# Patient Record
Sex: Female | Born: 1960
Health system: Southern US, Community
[De-identification: ages and names within clinical notes are randomized; demographics above are authoritative.]

## PROBLEM LIST (undated history)

## (undated) DIAGNOSIS — T7840XA Allergy, unspecified, initial encounter: Secondary | ICD-10-CM

## (undated) DIAGNOSIS — D649 Anemia, unspecified: Secondary | ICD-10-CM

## (undated) DIAGNOSIS — I10 Essential (primary) hypertension: Secondary | ICD-10-CM

## (undated) DIAGNOSIS — J45909 Unspecified asthma, uncomplicated: Secondary | ICD-10-CM

## (undated) DIAGNOSIS — F419 Anxiety disorder, unspecified: Secondary | ICD-10-CM

## (undated) HISTORY — DX: Anemia, unspecified: D64.9

## (undated) HISTORY — DX: Allergy, unspecified, initial encounter: T78.40XA

## (undated) HISTORY — DX: Anxiety disorder, unspecified: F41.9

## (undated) HISTORY — DX: Essential (primary) hypertension: I10

## (undated) HISTORY — DX: Unspecified asthma, uncomplicated: J45.909

## (undated) HISTORY — PX: NO PAST SURGERIES: SHX2092

---

## 1998-02-22 ENCOUNTER — Other Ambulatory Visit: Admission: RE | Admit: 1998-02-22 | Discharge: 1998-02-22 | Payer: Self-pay | Admitting: *Deleted

## 1999-03-14 ENCOUNTER — Other Ambulatory Visit: Admission: RE | Admit: 1999-03-14 | Discharge: 1999-03-14 | Payer: Self-pay | Admitting: *Deleted

## 2000-09-09 ENCOUNTER — Other Ambulatory Visit: Admission: RE | Admit: 2000-09-09 | Discharge: 2000-09-09 | Payer: Self-pay | Admitting: Internal Medicine

## 2001-09-09 ENCOUNTER — Other Ambulatory Visit: Admission: RE | Admit: 2001-09-09 | Discharge: 2001-09-09 | Payer: Self-pay | Admitting: Internal Medicine

## 2001-09-22 ENCOUNTER — Encounter: Payer: Self-pay | Admitting: Internal Medicine

## 2001-09-22 ENCOUNTER — Ambulatory Visit (HOSPITAL_COMMUNITY): Admission: RE | Admit: 2001-09-22 | Discharge: 2001-09-22 | Payer: Self-pay | Admitting: Internal Medicine

## 2002-09-29 ENCOUNTER — Encounter: Payer: Self-pay | Admitting: Internal Medicine

## 2002-09-29 ENCOUNTER — Ambulatory Visit (HOSPITAL_COMMUNITY): Admission: RE | Admit: 2002-09-29 | Discharge: 2002-09-29 | Payer: Self-pay | Admitting: Internal Medicine

## 2003-09-18 ENCOUNTER — Other Ambulatory Visit: Admission: RE | Admit: 2003-09-18 | Discharge: 2003-09-18 | Payer: Self-pay | Admitting: Internal Medicine

## 2003-09-28 ENCOUNTER — Encounter: Admission: RE | Admit: 2003-09-28 | Discharge: 2003-09-28 | Payer: Self-pay | Admitting: Internal Medicine

## 2005-04-03 ENCOUNTER — Ambulatory Visit (HOSPITAL_COMMUNITY): Admission: RE | Admit: 2005-04-03 | Discharge: 2005-04-03 | Payer: Self-pay | Admitting: Internal Medicine

## 2005-10-01 ENCOUNTER — Other Ambulatory Visit: Admission: RE | Admit: 2005-10-01 | Discharge: 2005-10-01 | Payer: Self-pay | Admitting: Internal Medicine

## 2006-01-13 ENCOUNTER — Other Ambulatory Visit: Admission: RE | Admit: 2006-01-13 | Discharge: 2006-01-13 | Payer: Self-pay | Admitting: Internal Medicine

## 2007-10-28 ENCOUNTER — Other Ambulatory Visit: Admission: RE | Admit: 2007-10-28 | Discharge: 2007-10-28 | Payer: Self-pay | Admitting: Internal Medicine

## 2009-11-01 ENCOUNTER — Other Ambulatory Visit: Admission: RE | Admit: 2009-11-01 | Discharge: 2009-11-01 | Payer: Self-pay | Admitting: Internal Medicine

## 2009-11-02 LAB — HM PAP SMEAR: HM Pap smear: NORMAL

## 2009-11-12 ENCOUNTER — Encounter: Admission: RE | Admit: 2009-11-12 | Discharge: 2009-11-12 | Payer: Self-pay | Admitting: Internal Medicine

## 2009-12-21 LAB — HM COLONOSCOPY

## 2012-10-25 ENCOUNTER — Other Ambulatory Visit: Payer: Self-pay

## 2012-10-25 DIAGNOSIS — Z1231 Encounter for screening mammogram for malignant neoplasm of breast: Secondary | ICD-10-CM

## 2012-11-12 ENCOUNTER — Ambulatory Visit
Admission: RE | Admit: 2012-11-12 | Discharge: 2012-11-12 | Disposition: A | Discharge status: Home / Self Care | Payer: BC Managed Care – PPO | Source: Ambulatory Visit

## 2012-11-12 DIAGNOSIS — Z1231 Encounter for screening mammogram for malignant neoplasm of breast: Secondary | ICD-10-CM

## 2012-11-12 LAB — HM MAMMOGRAPHY: HM MAMMO: NORMAL

## 2013-05-17 ENCOUNTER — Telehealth: Payer: Self-pay | Admitting: Physician Assistant

## 2013-05-17 MED ORDER — POTASSIUM CHLORIDE 20 MEQ/15ML (10%) PO LIQD: 20.0000 meq | Freq: Every day | ORAL | Status: DC

## 2013-05-17 NOTE — Telephone Encounter (Signed)
Pt said that last time she was in her potassium keeps goin low even with being on the med=klor=its still low. She said she thinks she knows why now=her bowel movement she could see the pill whole She wanted to know is there a liquid form of this med??? Call pt back at=[phone] until 5;30 or [phone] after 5;30 [facility] is [facility] [address]  Sending chart back

## 2013-05-18 NOTE — Telephone Encounter (Signed)
Pt aware rx called/sent in

## 2013-06-30 DIAGNOSIS — T7840XA Allergy, unspecified, initial encounter: Secondary | ICD-10-CM | POA: Insufficient documentation

## 2013-06-30 DIAGNOSIS — I10 Essential (primary) hypertension: Secondary | ICD-10-CM | POA: Insufficient documentation

## 2013-06-30 DIAGNOSIS — J45909 Unspecified asthma, uncomplicated: Secondary | ICD-10-CM | POA: Insufficient documentation

## 2013-06-30 DIAGNOSIS — D649 Anemia, unspecified: Secondary | ICD-10-CM | POA: Insufficient documentation

## 2013-06-30 DIAGNOSIS — Z9109 Other allergy status, other than to drugs and biological substances: Secondary | ICD-10-CM | POA: Insufficient documentation

## 2013-06-30 DIAGNOSIS — F419 Anxiety disorder, unspecified: Secondary | ICD-10-CM | POA: Insufficient documentation

## 2013-07-04 ENCOUNTER — Ambulatory Visit: Payer: Self-pay | Admitting: Physician Assistant

## 2013-07-15 ENCOUNTER — Ambulatory Visit (HOSPITAL_COMMUNITY)
Admission: RE | Admit: 2013-07-15 | Discharge: 2013-07-15 | Disposition: A | Discharge status: Home / Self Care | Payer: BC Managed Care – PPO | Source: Ambulatory Visit | Attending: Physician Assistant | Admitting: Physician Assistant

## 2013-07-15 ENCOUNTER — Ambulatory Visit (INDEPENDENT_AMBULATORY_CARE_PROVIDER_SITE_OTHER): Payer: BC Managed Care – PPO | Admitting: Physician Assistant

## 2013-07-15 ENCOUNTER — Encounter: Payer: Self-pay | Admitting: Physician Assistant

## 2013-07-15 VITALS — BP 128/78 | HR 72 | Temp 97.9°F | Resp 16 | Ht 63.5 in | Wt 156.0 lb

## 2013-07-15 DIAGNOSIS — M25511 Pain in right shoulder: Secondary | ICD-10-CM

## 2013-07-15 DIAGNOSIS — I10 Essential (primary) hypertension: Secondary | ICD-10-CM

## 2013-07-15 DIAGNOSIS — E785 Hyperlipidemia, unspecified: Secondary | ICD-10-CM

## 2013-07-15 DIAGNOSIS — R2231 Localized swelling, mass and lump, right upper limb: Secondary | ICD-10-CM

## 2013-07-15 DIAGNOSIS — M259 Joint disorder, unspecified: Secondary | ICD-10-CM | POA: Insufficient documentation

## 2013-07-15 DIAGNOSIS — Z79899 Other long term (current) drug therapy: Secondary | ICD-10-CM

## 2013-07-15 DIAGNOSIS — E782 Mixed hyperlipidemia: Secondary | ICD-10-CM

## 2013-07-15 DIAGNOSIS — E876 Hypokalemia: Secondary | ICD-10-CM

## 2013-07-15 DIAGNOSIS — M25519 Pain in unspecified shoulder: Secondary | ICD-10-CM

## 2013-07-15 DIAGNOSIS — E559 Vitamin D deficiency, unspecified: Secondary | ICD-10-CM

## 2013-07-15 LAB — HEPATIC FUNCTION PANEL
ALK PHOS: 60 U/L (ref 39–117)
ALT: 19 U/L (ref 0–35)
AST: 26 U/L (ref 0–37)
Albumin: 4.1 g/dL (ref 3.5–5.2)
BILIRUBIN DIRECT: 0.2 mg/dL (ref 0.0–0.3)
BILIRUBIN TOTAL: 0.6 mg/dL (ref 0.3–1.2)
Indirect Bilirubin: 0.4 mg/dL (ref 0.0–0.9)
Total Protein: 6.8 g/dL (ref 6.0–8.3)

## 2013-07-15 LAB — LIPID PANEL
CHOL/HDL RATIO: 2.7 ratio
Cholesterol: 153 mg/dL (ref 0–200)
HDL: 57 mg/dL (ref >39)
LDL Cholesterol: 77 mg/dL (ref 0–99)
Triglycerides: 97 mg/dL (ref <150)
VLDL: 19 mg/dL (ref 0–40)

## 2013-07-15 LAB — BASIC METABOLIC PANEL WITH GFR
BUN: 8 mg/dL (ref 6–23)
CHLORIDE: 100 meq/L (ref 96–112)
CO2: 23 meq/L (ref 19–32)
Calcium: 9.7 mg/dL (ref 8.4–10.5)
Creat: 0.8 mg/dL (ref 0.50–1.10)
GFR, Est African American: >89 mL/min
GFR, Est Non African American: 84 mL/min
Glucose, Bld: 85 mg/dL (ref 70–99)
Potassium: 3.7 mEq/L (ref 3.5–5.3)
SODIUM: 134 meq/L — AB (ref 135–145)

## 2013-07-15 LAB — CBC WITH DIFFERENTIAL/PLATELET
Basophils Absolute: 0.1 10*3/uL (ref 0.0–0.1)
Basophils Relative: 1 % (ref 0–1)
EOS PCT: 1 % (ref 0–5)
Eosinophils Absolute: 0.2 10*3/uL (ref 0.0–0.7)
HCT: 40.1 % (ref 36.0–46.0)
HEMOGLOBIN: 13.5 g/dL (ref 12.0–15.0)
LYMPHS ABS: 2.3 10*3/uL (ref 0.7–4.0)
LYMPHS PCT: 16 % (ref 12–46)
MCH: 33.5 pg (ref 26.0–34.0)
MCHC: 33.7 g/dL (ref 30.0–36.0)
MCV: 99.5 fL (ref 78.0–100.0)
Monocytes Absolute: 0.7 10*3/uL (ref 0.1–1.0)
Monocytes Relative: 5 % (ref 3–12)
Neutro Abs: 11 10*3/uL — ABNORMAL HIGH (ref 1.7–7.7)
Neutrophils Relative %: 77 % (ref 43–77)
Platelets: 375 10*3/uL (ref 150–400)
RBC: 4.03 MIL/uL (ref 3.87–5.11)
RDW: 13.6 % (ref 11.5–15.5)
WBC: 14.2 10*3/uL — AB (ref 4.0–10.5)

## 2013-07-15 LAB — TSH: TSH: 1.424 u[IU]/mL (ref 0.350–4.500)

## 2013-07-15 LAB — MAGNESIUM: Magnesium: 2 mg/dL (ref 1.5–2.5)

## 2013-07-15 MED ORDER — MELOXICAM 15 MG PO TABS: ORAL_TABLET | ORAL | Status: DC

## 2013-07-15 MED ORDER — TRAMADOL HCL 50 MG PO TABS: 50.0000 mg | ORAL_TABLET | Freq: Four times a day (QID) | ORAL | Status: DC | PRN

## 2013-07-15 NOTE — Patient Instructions (Addendum)
decrease HCTZ to 1/2 daily, you can increase the atenolol to a whole if needed and the goal is to be able to stop the potassium supplement.  Please get the white salt substitute, 1/4 teaspoon of this is equivalent to 4meq potassium.   High potassium foods from natural food sources like beans, dark leafy greens, potatoes, squash, yogurt, fish, avocados, mushrooms, and bananas, are considered safe and healthy.  DASH Diet The DASH diet stands for "Dietary Approaches to Stop Hypertension." It is a healthy eating plan that has been shown to reduce high blood pressure (hypertension) in as little as 14 days, while also possibly providing other significant health benefits. These other health benefits include reducing the risk of breast cancer after menopause and reducing the risk of type 2 diabetes, heart disease, colon cancer, and stroke. Health benefits also include weight loss and slowing kidney failure in patients with chronic kidney disease.  DIET GUIDELINES  Limit salt (sodium). Your diet should contain less than 1500 mg of sodium daily.  Limit refined or processed carbohydrates. Your diet should include mostly whole grains. Desserts and added sugars should be used sparingly.  Include small amounts of heart-healthy fats. These types of fats include nuts, oils, and tub margarine. Limit saturated and trans fats. These fats have been shown to be harmful in the body. CHOOSING FOODS  The following food groups are based on a 2000 calorie diet. See your Registered Dietitian for individual calorie needs. Grains and Grain Products (6 to 8 servings daily)  Eat More Often: Whole-wheat bread, brown rice, whole-grain or wheat pasta, quinoa, popcorn without added fat or salt (air popped).  Eat Less Often: White bread, white pasta, white rice, cornbread. Vegetables (4 to 5 servings daily)  Eat More Often: Fresh, frozen, and canned vegetables. Vegetables may be raw, steamed, roasted, or grilled with a minimal  amount of fat.  Eat Less Often/Avoid: Creamed or fried vegetables. Vegetables in a cheese sauce. Fruit (4 to 5 servings daily)  Eat More Often: All fresh, canned (in natural juice), or frozen fruits. Dried fruits without added sugar. One hundred percent fruit juice ( cup [237 mL] daily).  Eat Less Often: Dried fruits with added sugar. Canned fruit in light or heavy syrup. [employer], Fish, and Poultry (2 servings or less daily. One serving is 3 to 4 oz [85-114 g]).  Eat More Often: Ninety percent or leaner ground beef, tenderloin, sirloin. Round cuts of beef, chicken breast, [place] breast. All fish. Grill, bake, or broil your meat. Nothing should be fried.  Eat Less Often/Avoid: Fatty cuts of meat, [place], or chicken leg, thigh, or wing. Fried cuts of meat or fish. Dairy (2 to 3 servings)  Eat More Often: Low-fat or fat-free milk, low-fat plain or light yogurt, reduced-fat or part-skim cheese.  Eat Less Often/Avoid: Milk (whole, 2%).Whole milk yogurt. Full-fat cheeses. Nuts, Seeds, and Legumes (4 to 5 servings per week)  Eat More Often: All without added salt.  Eat Less Often/Avoid: Salted nuts and seeds, canned beans with added salt. Fats and Sweets (limited)  Eat More Often: Vegetable oils, tub margarines without trans fats, sugar-free gelatin. Mayonnaise and salad dressings.  Eat Less Often/Avoid: Coconut oils, palm oils, butter, stick margarine, cream, half and half, cookies, candy, pie. FOR MORE INFORMATION The Dash Diet Eating Plan: www.dashdiet.org Document Released: 06/05/2011 Document Revised: 09/08/2011 Document Reviewed: 06/05/2011 [facility] Patient Information 2014 [place], [place].   Right shoulder rest, ice/heat, can take Mobic or Tramadol.

## 2013-07-15 NOTE — Progress Notes (Signed)
HPI Patient presents for 3 month follow up with hypertension, hyperlipidemia, prediabetes and vitamin D. Patient's blood pressure has been controlled at home, today their BP is BP: 128/78 mmHg  Walks stairs at work. Patient denies chest pain, shortness of breath, dizziness.  Patient's cholesterol is diet controlled. The cholesterol last visit was Trigs 160, LDL 80, HDL 64.  She has Hypokalemia due to HCTZ- she takes liquid potassium 24meq daily. She denies swelling in her legs. She takes 1/2 of the atenolol at night. She is on the magnesium now.  Patient is on Vitamin D supplement.   Patient has right shoulder pain for 3-4 weeks, with no fall/injury other than she was carrying 30 lb cat food 3 weeks ago. There is a nodule/knot on right AC that is tender. She has full ROM of shoulder, she has some numbness/tingling in her right thumb with some weakness. Feels like her right thumb is "jumping". Painful during the day and the night, worse at night, often keeping her up and waking her up the last 2 nights. She has taken ibuprofen OTC/heat that helps for a short period.  Current Medications:  Current Outpatient Prescriptions on File Prior to Visit  Medication Sig Dispense Refill  . ALPRAZolam (XANAX) 0.5 MG tablet Take 0.5 mg by mouth 3 (three) times daily as needed for anxiety.      Marland Kitchen atenolol (TENORMIN) 50 MG tablet Take 50 mg by mouth daily.      . hydrochlorothiazide (HYDRODIURIL) 25 MG tablet Take 25 mg by mouth daily.      Marland Kitchen levonorgestrel-ethinyl estradiol (LEVORA 0.15/30, 28,) 0.15-30 MG-MCG tablet Take 1 tablet by mouth daily.      Marland Kitchen MAGNESIUM PO Take by mouth daily.      . montelukast (SINGULAIR) 10 MG tablet Take 10 mg by mouth at bedtime.      Marland Kitchen omeprazole (PRILOSEC) 20 MG capsule Take 20 mg by mouth 2 (two) times daily before a meal.      . potassium chloride 20 MEQ/15ML (10%) solution Take 15 mLs (20 mEq total) by mouth daily.  500 mL  0  . valsartan (DIOVAN) 320 MG tablet Take 320 mg by  mouth daily.       No current facility-administered medications on file prior to visit.   Medical History:  Past Medical History  Diagnosis Date  . Hypertension   . Allergy   . Anemia   . Anxiety   . Asthma    Allergies:  Allergies  Allergen Reactions  . Ciprofloxacin Nausea Only    Headache    ROS Constitutional: Denies fever, chills, headaches, insomnia, fatigue, night sweats Eyes: Denies redness, blurred vision, diplopia, discharge, itchy, watery eyes.  ENT: Denies congestion, post nasal drip, sore throat, earache, dental pain, Tinnitus, Vertigo, Sinus pain, snoring.  Cardio: Denies chest pain, palpitations, irregular heartbeat, dyspnea, diaphoresis, orthopnea, PND, claudication, edema Respiratory: denies cough, shortness of breath, wheezing.  Gastrointestinal: Denies dysphagia, heartburn, AB pain/ cramps, N/V, diarrhea, constipation, hematemesis, melena, hematochezia,  hemorrhoids Genitourinary: Denies dysuria, frequency, urgency, nocturia, hesitancy, discharge, hematuria, flank pain Musculoskeletal: + right shoulder pain Denies myalgia, stiffness, swelling and strain/sprain. Skin: Denies pruritis, rash, changing in skin lesion Neuro: Denies Weakness, tremor, incoordination, spasms, pain Psychiatric: Denies confusion, memory loss, sensory loss Endocrine: Denies change in weight, skin, hair change, nocturia Diabetic Polys, Denies visual blurring, hyper /hypo glycemic episodes, and paresthesia, Heme/Lymph: Denies Excessive bleeding, bruising, enlarged lymph nodes  Family history- Review and unchanged Social history- Review and unchanged Physical  Exam: Filed Vitals:   07/15/13 0941  BP: 128/78  Pulse: 72  Temp: 97.9 F (36.6 C)  Resp: 16   Filed Weights   07/15/13 0941  Weight: 156 lb (70.761 kg)   General Appearance: Well nourished, in no apparent distress. Eyes: PERRLA, EOMs, conjunctiva no swelling or erythema Sinuses: No Frontal/maxillary  tenderness ENT/Mouth: Ext aud canals clear, TMs without erythema, bulging. No erythema, swelling, or exudate on post pharynx.  Tonsils not swollen or erythematous. Hearing normal.  Neck: Supple, thyroid normal.  Respiratory: Respiratory effort normal, BS equal bilaterally without rales, rhonchi, wheezing or stridor.  Cardio: RRR with no MRGs. Brisk peripheral pulses without edema.  Abdomen: Soft, + BS.  Non tender, no guarding, rebound, hernias, masses. Lymphatics: Non tender without lymphadenopathy.  Musculoskeletal: Full ROM, 5/5 strength, normal gait. She has a hard, tender nonmobile nodule on her right AC joint. Full ROM without pain, + crepitus, decreased brachial reflexes, good cap refill,5/5 strength/grip.  Skin: Warm, dry without rashes, lesions, ecchymosis.  Neuro: Cranial nerves intact. Normal muscle tone, no cerebellar symptoms. Sensation intact.  Psych: Awake and oriented X 3, normal affect, Insight and Judgment appropriate.   Assessment and Plan:  Hypertension: Continue medication, monitor blood pressure at home. Continue DASH diet. Hypokalemia due to medication- we can try to decrease HCTZ to 1/2 daily, she can increase the atenolol to a whole if needed and the goal is to be able to stop the potassium supplement.  Cholesterol: Continue diet and exercise. Check cholesterol.  Vitamin D Def- check level and continue medications.  Right AC nodule without fall/injury- get Xray rule out mass, ? Ganglion, arthritis, AC separation. RICE, meloxicam, tramadol. Pending xray may do PT/send to ortho.   Right Shoulder IMPRESSION: 1. Acromioclavicular and glenohumeral degenerative change. No acute abnormality. 2. 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  Will refer to ortho  Continue diet and meds as discussed. Further disposition pending results of labs.  Vicie Mutters 9:50 AM

## 2013-07-16 LAB — VITAMIN D 25 HYDROXY (VIT D DEFICIENCY, FRACTURES): VIT D 25 HYDROXY: 57 ng/mL (ref 30–89)

## 2013-07-17 ENCOUNTER — Encounter: Payer: Self-pay | Admitting: Internal Medicine

## 2013-07-18 ENCOUNTER — Ambulatory Visit: Payer: Self-pay | Admitting: Physician Assistant

## 2013-07-19 ENCOUNTER — Encounter: Payer: Self-pay | Admitting: Physician Assistant

## 2013-07-28 ENCOUNTER — Ambulatory Visit: Payer: Self-pay | Admitting: Physician Assistant

## 2013-09-26 ENCOUNTER — Other Ambulatory Visit: Payer: Self-pay | Admitting: *Deleted

## 2013-09-26 MED ORDER — ATENOLOL 50 MG PO TABS: 50.0000 mg | ORAL_TABLET | Freq: Every day | ORAL | Status: DC

## 2013-11-14 ENCOUNTER — Other Ambulatory Visit: Payer: Self-pay | Admitting: Internal Medicine

## 2013-11-14 ENCOUNTER — Other Ambulatory Visit: Payer: Self-pay

## 2013-11-14 DIAGNOSIS — N951 Menopausal and female climacteric states: Secondary | ICD-10-CM

## 2013-11-14 DIAGNOSIS — F341 Dysthymic disorder: Secondary | ICD-10-CM

## 2013-11-14 MED ORDER — ALPRAZOLAM 0.5 MG PO TABS: 0.5000 mg | ORAL_TABLET | Freq: Three times a day (TID) | ORAL | Status: DC | PRN

## 2013-11-14 MED ORDER — LEVONORGESTREL-ETHINYL ESTRAD 0.15-30 MG-MCG PO TABS: 1.0000 | ORAL_TABLET | Freq: Every day | ORAL | Status: DC

## 2013-11-16 ENCOUNTER — Encounter: Payer: Self-pay | Admitting: Physician Assistant

## 2013-11-17 ENCOUNTER — Other Ambulatory Visit: Payer: Self-pay | Admitting: *Deleted

## 2013-11-17 DIAGNOSIS — N951 Menopausal and female climacteric states: Secondary | ICD-10-CM

## 2013-11-17 MED ORDER — LEVONORGESTREL-ETHINYL ESTRAD 0.15-30 MG-MCG PO TABS: 1.0000 | ORAL_TABLET | Freq: Every day | ORAL | Status: DC

## 2013-11-29 ENCOUNTER — Encounter: Payer: Self-pay | Admitting: Physician Assistant

## 2013-11-29 ENCOUNTER — Ambulatory Visit (INDEPENDENT_AMBULATORY_CARE_PROVIDER_SITE_OTHER): Payer: BC Managed Care – PPO | Admitting: Physician Assistant

## 2013-11-29 ENCOUNTER — Other Ambulatory Visit (HOSPITAL_COMMUNITY)
Admission: RE | Admit: 2013-11-29 | Discharge: 2013-11-29 | Disposition: A | Discharge status: Home / Self Care | Payer: BC Managed Care – PPO | Source: Ambulatory Visit | Attending: Physician Assistant | Admitting: Physician Assistant

## 2013-11-29 VITALS — BP 118/72 | HR 84 | Temp 98.2°F | Resp 16 | Ht 63.5 in | Wt 166.0 lb

## 2013-11-29 DIAGNOSIS — Z0000 Encounter for general adult medical examination without abnormal findings: Secondary | ICD-10-CM

## 2013-11-29 DIAGNOSIS — R2231 Localized swelling, mass and lump, right upper limb: Secondary | ICD-10-CM

## 2013-11-29 DIAGNOSIS — M25511 Pain in right shoulder: Secondary | ICD-10-CM

## 2013-11-29 DIAGNOSIS — Z1151 Encounter for screening for human papillomavirus (HPV): Secondary | ICD-10-CM | POA: Insufficient documentation

## 2013-11-29 DIAGNOSIS — Z01419 Encounter for gynecological examination (general) (routine) without abnormal findings: Secondary | ICD-10-CM | POA: Insufficient documentation

## 2013-11-29 DIAGNOSIS — Z124 Encounter for screening for malignant neoplasm of cervix: Secondary | ICD-10-CM

## 2013-11-29 LAB — CBC WITH DIFFERENTIAL/PLATELET
BASOS PCT: 1 % (ref 0–1)
Basophils Absolute: 0.1 10*3/uL (ref 0.0–0.1)
Eosinophils Absolute: 0.4 10*3/uL (ref 0.0–0.7)
Eosinophils Relative: 3 % (ref 0–5)
HCT: 37.8 % (ref 36.0–46.0)
HEMOGLOBIN: 12.7 g/dL (ref 12.0–15.0)
LYMPHS PCT: 24 % (ref 12–46)
Lymphs Abs: 3.1 10*3/uL (ref 0.7–4.0)
MCH: 32.9 pg (ref 26.0–34.0)
MCHC: 33.6 g/dL (ref 30.0–36.0)
MCV: 97.9 fL (ref 78.0–100.0)
MONOS PCT: 7 % (ref 3–12)
Monocytes Absolute: 0.9 10*3/uL (ref 0.1–1.0)
NEUTROS ABS: 8.5 10*3/uL — AB (ref 1.7–7.7)
Neutrophils Relative %: 65 % (ref 43–77)
Platelets: 341 10*3/uL (ref 150–400)
RBC: 3.86 MIL/uL — ABNORMAL LOW (ref 3.87–5.11)
RDW: 14.1 % (ref 11.5–15.5)
WBC: 13 10*3/uL — AB (ref 4.0–10.5)

## 2013-11-29 LAB — HEMOGLOBIN A1C
Hgb A1c MFr Bld: 5.3 % (ref <5.7)
Mean Plasma Glucose: 105 mg/dL (ref <117)

## 2013-11-29 MED ORDER — MELOXICAM 15 MG PO TABS: ORAL_TABLET | ORAL | Status: DC

## 2013-11-29 NOTE — Progress Notes (Signed)
Complete Physical  Assessment and Plan: Hypertension-- continue medications, DASH diet, exercise and monitor at home. Call if greater than 130/80.   Allergic rhinitis- Allegra OTC, increase H20, allergy hygiene explained.  Anemia- check CBC  Anxiety- discussed stress management and takes xanax PRN  Asthma- controlled  PAP MGM  Discussed med's effects and SE's. Screening labs and tests as requested with regular follow-up as recommended.  HPI 53 y.o. female  presents for a complete physical. Her blood pressure has been controlled at home, today their BP is BP: 118/72 mmHg She does workout, walks the stairs at work. She denies chest pain, shortness of breath, dizziness.  She is on cholesterol medication and denies myalgias. Her cholesterol is at goal. The cholesterol last visit was:   Lab Results  Component Value Date   CHOL 153 07/15/2013   HDL 57 07/15/2013   LDLCALC 77 07/15/2013   TRIG 97 07/15/2013   CHOLHDL 2.7 07/15/2013   Last A1C in the office was: 5.1 Patient is on Vitamin D supplement.   She has a history of hypokalemia due to HCTZ, she has decreased the HCTZ to 1/2 pill and taking magnesium, she is no longer on K+ and denies edema.  Current Medications:  Current Outpatient Prescriptions on File Prior to Visit  Medication Sig Dispense Refill  . ALPRAZolam (XANAX) 0.5 MG tablet Take 1 tablet (0.5 mg total) by mouth 3 (three) times daily as needed for anxiety.  90 tablet  0  . atenolol (TENORMIN) 50 MG tablet Take 1 tablet (50 mg total) by mouth daily.  90 tablet  1  . hydrochlorothiazide (HYDRODIURIL) 25 MG tablet Take 25 mg by mouth daily.      Marland Kitchen levonorgestrel-ethinyl estradiol (LEVORA 0.15/30, 28,) 0.15-30 MG-MCG tablet Take 1 tablet by mouth daily.  3 Package  4  . MAGNESIUM PO Take by mouth daily.      . meloxicam (MOBIC) 15 MG tablet Once daily with food for pain  90 tablet  1  . montelukast (SINGULAIR) 10 MG tablet Take 10 mg by mouth at bedtime.      Marland Kitchen omeprazole  (PRILOSEC) 20 MG capsule Take 20 mg by mouth 2 (two) times daily before a meal.      . potassium chloride 20 MEQ/15ML (10%) solution Take 15 mLs (20 mEq total) by mouth daily.  500 mL  0  . traMADol (ULTRAM) 50 MG tablet Take 1 tablet (50 mg total) by mouth every 6 (six) hours as needed.  90 tablet  0  . valsartan (DIOVAN) 320 MG tablet Take 320 mg by mouth daily.       No current facility-administered medications on file prior to visit.   Health Maintenance:   Immunization History  Administered Date(s) Administered  . Pneumococcal Polysaccharide-23 06/30/1996  . Tdap 11/16/2012   Tetanus: 2014 Pneumovax: 1998 Flu vaccine: declines Zostavax: N/A Pap: 2011  DUE MGM: 10/2012 DUE this year DEXA: N/A Colonoscopy: 2011 due 2021 EGD: N/A  Allergies:  Allergies  Allergen Reactions  . Ciprofloxacin Nausea Only    Headache   Medical History:  Past Medical History  Diagnosis Date  . Hypertension   . Allergy   . Anemia   . Anxiety   . Asthma    Surgical History: No past surgical history on file. Family History:  Family History  Problem Relation Age of Onset  . Hypertension Mother   . Diabetes Mother   . Thyroid disease Mother   . Cataracts Mother   .  Diabetes Father   . Hypertension Father   . Heart disease Father    Social History:  History  Substance Use Topics  . Smoking status: Never Smoker   . Smokeless tobacco: Never Used  . Alcohol Use: Yes     Comment: On occasion    Review of Systems: [X]  = complains of  [ ]  = denies  General: Fatigue [ ]  Fever [ ]  Chills [ ]  Weakness [ ]   Insomnia [ ] Weight change [ ]  Night sweats [ ]   Change in appetite [ ]  Eyes: Redness [ ]  Blurred vision [ ]  Diplopia [ ]  Discharge [ ]   ENT: Congestion [ ]  Sinus Pain [ ]  Post Nasal Drip [ ]  Sore Throat [ ]  Earache [ ]  hearing loss [ ]  Tinnitus [ ]  Snoring [ ]   Cardiac: Chest pain/pressure [ ]  SOB [ ]  Orthopnea [ ]   Palpitations [ ]   Paroxysmal nocturnal dyspnea[ ]  Claudication [ ]   Edema [ ]   Pulmonary: Cough [ ]  Wheezing[ ]   SOB [ ]   Pleurisy [ ]   GI: Nausea [ ]  Vomiting[ ]  Dysphagia[ ]  Heartburn[ ]  Abdominal pain [ ]  Constipation [ ] ; Diarrhea [ ]  BRBPR [ ]  Melena[ ]  Bloating [ ]  Hemorrhoids [ ]   GU: Hematuria[ ]  Dysuria [ ]  Nocturia[ ]  Urgency [ ]   Hesitancy [ ]  Discharge [ ]  Frequency [ ]   Breast:  Breast lumps [ ]   nipple discharge [ ]    Neuro: Headaches[ ]  Vertigo[ ]  Paresthesias[ ]  Spasm [ ]  Speech changes [ ]  Incoordination [ ]   Ortho: Arthritis [ ]  Joint pain [ ]  Muscle pain [ ]  Joint swelling [ ]  Back Pain [ ]  Skin:  Rash [ ]   Pruritis [ ]  Change in skin lesion [ ]   Psych: Depression[ ]  Anxiety[ ]  Confusion [ ]  Memory loss [ ]   Heme/Lypmh: Bleeding [ ]  Bruising [ ]  Enlarged lymph nodes [ ]   Endocrine: Visual blurring [ ]  Paresthesia [ ]  Polyuria [ ]  Polydypsea [ ]    Heat/cold intolerance [ ]  Hypoglycemia [ ]   Physical Exam: Estimated body mass index is 28.94 kg/(m^2) as calculated from the following:   Height as of this encounter: 5' 3.5" (1.613 m).   Weight as of this encounter: 166 lb (75.297 kg). BP 118/72  Pulse 84  Temp(Src) 98.2 F (36.8 C)  Resp 16  Ht 5' 3.5" (1.613 m)  Wt 166 lb (75.297 kg)  BMI 28.94 kg/m2 General Appearance: Well nourished, in no apparent distress. Eyes: PERRLA, EOMs, conjunctiva no swelling or erythema, normal fundi and vessels. Sinuses: No Frontal/maxillary tenderness ENT/Mouth: Ext aud canals clear, normal light reflex with TMs without erythema, bulging.  Good dentition. No erythema, swelling, or exudate on post pharynx. Tonsils not swollen or erythematous. Hearing normal.  Neck: Supple, thyroid normal. No bruits Respiratory: Respiratory effort normal, BS equal bilaterally without rales, rhonchi, wheezing or stridor. Cardio: RRR without murmurs, rubs or gallops. Brisk peripheral pulses without edema.  Chest: symmetric, with normal excursions and percussion. Breasts: Symmetric, without lumps, nipple discharge,  retractions. Abdomen: Soft, +BS. Non tender, no guarding, rebound, hernias, masses, or organomegaly. .  Lymphatics: Non tender without lymphadenopathy.  Genitourinary: normal external genitalia, vulva, vagina, cervix, uterus and adnexa, PAP: Pap smear done today. Musculoskeletal: Full ROM all peripheral extremities,5/5 strength, and normal gait. Skin: Warm, dry without rashes, lesions, ecchymosis.  Neuro: Cranial nerves intact, reflexes equal bilaterally. Normal muscle tone, no cerebellar symptoms. Sensation intact.  Psych: Awake and oriented X 3, normal  affect, Insight and Judgment appropriate.   EKG: WNL no changes. AORTA SCAN: WNL    Vicie Mutters 4:02 PM

## 2013-11-29 NOTE — Patient Instructions (Signed)
Preventative Care for Adults - Female      MAINTAIN REGULAR HEALTH EXAMS:  A routine yearly physical is a good way to check in with your primary care provider about your health and preventive screening. It is also an opportunity to share updates about your health and any concerns you have, and receive a thorough all-over exam.   Most health insurance companies pay for at least some preventative services.  Check with your health plan for specific coverages.  WHAT PREVENTATIVE SERVICES DO WOMEN NEED?  Adult women should have their weight and blood pressure checked regularly.   Women age 35 and older should have their cholesterol levels checked regularly.  Women should be screened for cervical cancer with a Pap smear and pelvic exam beginning at either age 21, or 3 years after they become sexually activity.    Breast cancer screening generally begins at age 40 with a mammogram and breast exam by your primary care provider.    Beginning at age 50 and continuing to age 75, women should be screened for colorectal cancer.  Certain people may need continued testing until age 85.  Updating vaccinations is part of preventative care.  Vaccinations help protect against diseases such as the flu.  Osteoporosis is a disease in which the bones lose minerals and strength as we age. Women ages 65 and over should discuss this with their caregivers, as should women after menopause who have other risk factors.  Lab tests are generally done as part of preventative care to screen for anemia and blood disorders, to screen for problems with the kidneys and liver, to screen for bladder problems, to check blood sugar, and to check your cholesterol level.  Preventative services generally include counseling about diet, exercise, avoiding tobacco, drugs, excessive alcohol consumption, and sexually transmitted infections.    GENERAL RECOMMENDATIONS FOR GOOD HEALTH:  Healthy diet:  Eat a variety of foods, including  fruit, vegetables, animal or vegetable protein, such as meat, fish, chicken, and eggs, or beans, lentils, tofu, and grains, such as rice.  Drink plenty of water daily.  Decrease saturated fat in the diet, avoid lots of red meat, processed foods, sweets, fast foods, and fried foods.  Exercise:  Aerobic exercise helps maintain good heart health. At least 30-40 minutes of moderate-intensity exercise is recommended. For example, a brisk walk that increases your heart rate and breathing. This should be done on most days of the week.   Find a type of exercise or a variety of exercises that you enjoy so that it becomes a part of your daily life.  Examples are running, walking, swimming, water aerobics, and biking.  For motivation and support, explore group exercise such as aerobic class, spin class, Zumba, Yoga,or  martial arts, etc.    Set exercise goals for yourself, such as a certain weight goal, walk or run in a race such as a 5k walk/run.  Speak to your primary care provider about exercise goals.  Disease prevention:  If you smoke or chew tobacco, find out from your caregiver how to quit. It can literally save your life, no matter how long you have been a tobacco user. If you do not use tobacco, never begin.   Maintain a healthy diet and normal weight. Increased weight leads to problems with blood pressure and diabetes.   The Body Mass Index or BMI is a way of measuring how much of your body is fat. Having a BMI above 27 increases the risk of heart disease,   diabetes, hypertension, stroke and other problems related to obesity. Your caregiver can help determine your BMI and based on it develop an exercise and dietary program to help you achieve or maintain this important measurement at a healthful level.  High blood pressure causes heart and blood vessel problems.  Persistent high blood pressure should be treated with medicine if weight loss and exercise do not work.   Fat and cholesterol leaves  deposits in your arteries that can block them. This causes heart disease and vessel disease elsewhere in your body.  If your cholesterol is found to be high, or if you have heart disease or certain other medical conditions, then you may need to have your cholesterol monitored frequently and be treated with medication.   Ask if you should have a cardiac stress test if your history suggests this. A stress test is a test done on a treadmill that looks for heart disease. This test can find disease prior to there being a problem.  Menopause can be associated with physical symptoms and risks. Hormone replacement therapy is available to decrease these. You should talk to your caregiver about whether starting or continuing to take hormones is right for you.   Osteoporosis is a disease in which the bones lose minerals and strength as we age. This can result in serious bone fractures. Risk of osteoporosis can be identified using a bone density scan. Women ages 65 and over should discuss this with their caregivers, as should women after menopause who have other risk factors. Ask your caregiver whether you should be taking a calcium supplement and Vitamin D, to reduce the rate of osteoporosis.   Avoid drinking alcohol in excess (more than two drinks per day).  Avoid use of street drugs. Do not share needles with anyone. Ask for professional help if you need assistance or instructions on stopping the use of alcohol, cigarettes, and/or drugs.  Brush your teeth twice a day with fluoride toothpaste, and floss once a day. Good oral hygiene prevents tooth decay and gum disease. The problems can be painful, unattractive, and can cause other health problems. Visit your dentist for a routine oral and dental check up and preventive care every 6-12 months.   Look at your skin regularly.  Use a mirror to look at your back. Notify your caregivers of changes in moles, especially if there are changes in shapes, colors, a size  larger than a pencil eraser, an irregular border, or development of new moles.  Safety:  Use seatbelts 100% of the time, whether driving or as a passenger.  Use safety devices such as hearing protection if you work in environments with loud noise or significant background noise.  Use safety glasses when doing any work that could send debris in to the eyes.  Use a helmet if you ride a bike or motorcycle.  Use appropriate safety gear for contact sports.  Talk to your caregiver about gun safety.  Use sunscreen with a SPF (or skin protection factor) of 15 or greater.  Lighter skinned people are at a greater risk of skin cancer. Don't forget to also wear sunglasses in order to protect your eyes from too much damaging sunlight. Damaging sunlight can accelerate cataract formation.   Practice safe sex. Use condoms. Condoms are used for birth control and to help reduce the spread of sexually transmitted infections (or STIs).  Some of the STIs are gonorrhea (the clap), chlamydia, syphilis, trichomonas, herpes, HPV (human papilloma virus) and HIV (human immunodeficiency virus)   which causes AIDS. The herpes, HIV and HPV are viral illnesses that have no cure. These can result in disability, cancer and death.   Keep carbon monoxide and smoke detectors in your home functioning at all times. Change the batteries every 6 months or use a model that plugs into the wall.   Vaccinations:  Stay up to date with your tetanus shots and other required immunizations. You should have a booster for tetanus every 10 years. Be sure to get your flu shot every year, since 5%-20% of the [place] population comes down with the flu. The flu vaccine changes each year, so being vaccinated once is not enough. Get your shot in the fall, before the flu season peaks.   Other vaccines to consider:  Human Papilloma Virus or HPV causes cancer of the cervix, and other infections that can be transmitted from person to person. There is a vaccine for  HPV, and females should get immunized between the ages of 63 and 23. It requires a series of 3 shots.   Pneumococcal vaccine to protect against certain types of pneumonia.  This is normally recommended for adults age 75 or older.  However, adults younger than 54 years old with certain underlying conditions such as diabetes, heart or lung disease should also receive the vaccine.  Shingles vaccine to protect against Varicella Zoster if you are older than age 57, or younger than 54 years old with certain underlying illness.  Hepatitis A vaccine to protect against a form of infection of the liver by a virus acquired from food.  Hepatitis B vaccine to protect against a form of infection of the liver by a virus acquired from blood or body fluids, particularly if you work in health care.  If you plan to travel internationally, check with your local health department for specific vaccination recommendations.  Cancer Screening:  Breast cancer screening is essential to preventive care for women. All women age 1 and older should perform a breast self-exam every month. At age 23 and older, women should have their caregiver complete a breast exam each year. Women at ages 63 and older should have a mammogram (x-ray film) of the breasts. Your caregiver can discuss how often you need mammograms.    Cervical cancer screening includes taking a Pap smear (sample of cells examined under a microscope) from the cervix (end of the uterus). It also includes testing for HPV (Human Papilloma Virus, which can cause cervical cancer). Screening and a pelvic exam should begin at age 110, or 3 years after a woman becomes sexually active. Screening should occur every year, with a Pap smear but no HPV testing, up to age 76. After age 66, you should have a Pap smear every 3 years with HPV testing, if no HPV was found previously.   Most routine colon cancer screening begins at the age of 68. On a yearly basis, doctors may provide  special easy to use take-home tests to check for hidden blood in the stool. Sigmoidoscopy or colonoscopy can detect the earliest forms of colon cancer and is life saving. These tests use a small camera at the end of a tube to directly examine the colon. Speak to your caregiver about this at age 17, when routine screening begins (and is repeated every 5 years unless early forms of pre-cancerous polyps or small growths are found).    Iliotibial Band Syndrome with Rehab The iliotibial (IT) band is a tendon that connects the hip muscles to the shinbone (tibia) and  to one of the bones of the pelvis (ileum). The IT band passes by the knee and is often irritated by the outer portion of the knee (lateral femoral condyle). A fluid filled sac (bursa) exists between the tendon and the bone, to cushion and reduce friction. Overuse of the tendon may cause excessive friction, which results in IT band syndrome. This condition involves inflammation of the bursa (bursitis) and/or inflammation of the IT band (tendinitis). SYMPTOMS   Pain, tenderness, swelling, warmth, or redness over the IT band, at the outer knee (above the joint).  Pain that travels up or down the thigh or leg.  Initially, pain at the beginning of an exercise, that decreases once warmed up. Eventually, pain throughout the activity, getting worse as the activity continues. May cause the athlete to stop in the middle of training or competing.  Pain that gets worse when running down hills or stairs, on banked tracks, or next to the curb on the street.  Pain that increases when the foot of the affected leg hits the ground.  Possibly, a crackling sound (crepitation) when the tendon or bursa is moved or touched. CAUSES  IT band syndrome is caused by irritation of the IT band and the underlying bursa. This eventually results in inflammation and pain. IT band syndrome is an overuse injury.  RISK INCREASES WITH:  Sports with repetitive knee-bending  activities (distance running, cycling).  Incorrect training techniques, including sudden changes in the intensity, frequency, or duration of training.  Not enough rest between workouts.  Poor strength and flexibility, especially a tight IT band.  Failure to warm up properly before activity.  Bow legs.  Arthritis of the knee. PREVENTION   Warm up and stretch properly before activity.  Allow for adequate recovery between workouts.  Maintain physical fitness:  Strength, flexibility, and endurance.  Cardiovascular fitness.  Learn and use proper training technique, including reducing running mileage, shortening stride, and avoiding running on hills and banked surfaces.  Wear arch supports (orthotics), if you have flat feet. PROGNOSIS  If treated properly, IT band syndrome usually goes away within 6 weeks of treatment. RELATED COMPLICATIONS   Longer healing time, if not properly treated, or if not given enough time to heal.  Recurring inflammation of the tendon and bursa, that may result in a chronic condition.  Recurring symptoms, if activity is resumed too soon, with overuse, with a direct blow, or with poor training technique.  Inability to complete training or competition. TREATMENT  Treatment first involves the use of ice and medicine, to reduce pain and inflammation. The use of strengthening and stretching exercises may help reduce pain with activity. These exercises may be performed at home or with a therapist. For individuals with flat feet, an arch support (orthotic) may be helpful. Some individuals find that wearing a knee sleeve or compression bandage around the knee during workouts provides some relief. Certain training techniques, such as adjusting stride length, avoiding running on hills or stairs, changing the direction you run on a circular or banked track, or changing the side of the road you run on, if you run next to the curb, may help decrease symptoms of IT band  syndrome. Cyclists may need to change the seat height or foot position on their bicycles. An injection of cortisone into the bursa may be recommended. Surgery to remove the inflamed bursa and/or part of the IT band is only considered after at least 6 months of non-surgical treatment.  MEDICATION   If pain  medicine is needed, nonsteroidal anti-inflammatory medicines (aspirin and ibuprofen), or other minor pain relievers (acetaminophen), are often advised.  Do not take pain medicine for 7 days before surgery.  Prescription pain relievers may be given, if your caregiver thinks they are needed. Use only as directed and only as much as you need.  Corticosteroid injections may be given by your caregiver. These injections should be reserved for the most serious cases, because they may only be given a certain number of times. HEAT AND COLD  Cold treatment (icing) should be applied for 10 to 15 minutes every 2 to 3 hours for inflammation and pain, and immediately after activity that aggravates your symptoms. Use ice packs or an ice massage.  Heat treatment may be used before performing stretching and strengthening activities prescribed by your caregiver, physical therapist, or athletic trainer. Use a heat pack or a warm water soak. SEEK MEDICAL CARE IF:   Symptoms get worse or do not improve in 2 to 4 weeks, despite treatment.  New, unexplained symptoms develop. (Drugs used in treatment may produce side effects.) EXERCISES  RANGE OF MOTION (ROM) AND STRETCHING EXERCISES - Iliotibial Band Syndrome These exercises may help you when beginning to rehabilitate your injury. Your symptoms may go away with or without further involvement from your physician, physical therapist or athletic trainer. While completing these exercises, remember:   Restoring tissue flexibility helps normal motion to return to the joints. This allows healthier, less painful movement and activity.  An effective stretch should be  held for at least 30 seconds.  A stretch should never be painful. You should only feel a gentle lengthening or release in the stretched tissue. STRETCH - Quadriceps, Prone   Lie on your stomach on a firm surface, such as a bed or padded floor.  Bend your right / left knee and grasp your ankle. If you are unable to reach your ankle or pant leg, use a belt around your foot to lengthen your reach.  Gently pull your heel toward your buttocks. Your knee should not slide out to the side. You should feel a stretch in the front of your thigh and knee.  Hold this position for __________ seconds. Repeat __________ times. Complete this stretch __________ times per day.  STRETCH  Iliotibial Band  On the floor or bed, lie on your side, so your right / left leg is on top. Bend your knee and grab your ankle.  Slowly bring your knee back so that your thigh is in line with your trunk. Keep your heel at your buttocks and gently arch your back, so your head, shoulders and hips line up.  Slowly lower your leg so that your knee approaches the floor or bed, until you feel a gentle stretch on the outside of your right / left thigh. If you do not feel a stretch and your knee will not fall farther, place the heel of your opposite foot on top of your knee, and pull your thigh down farther.  Hold this stretch for __________ seconds. Repeat __________ times. Complete this stretch __________ times per day. STRENGTHENING EXERCISES - Iliotibial Band Syndrome Improving the flexibility of the IT band will best relieve your discomfort due to IT band syndrome. Strengthening exercises, however, can help improve both muscle endurance and joint mechanics, reducing the factors that can contribute to this condition. Your physician, physical therapist or athletic trainer may provide you with exercises that train specific muscle groups that are especially weak. The following exercises target  muscles that are often weak in people who  have IT band syndrome. STRENGTH - Hip Abductors, Straight Leg Raises  Be aware of your form throughout the entire exercise, so that you exercise the correct muscles. Poor form means that you are not strengthening the correct muscles.  Lie on your side, so that your head, shoulders, knee and hip line up. You may bend your lower knee to help maintain your balance. Your right / left leg should be on top.  Roll your hips slightly forward, so that your hips are stacked directly over each other and your right / left knee is facing forward.  Lift your top leg up 4-6 inches, leading with your heel. Be sure that your foot does not drift forward and that your knee does not roll toward the ceiling.  Hold this position for __________ seconds. You should feel the muscles in your outer hip lifting (you may not notice this until your leg begins to tire).  Slowly lower your leg to the starting position. Allow the muscles to fully relax before beginning the next repetition. Repeat __________ times. Complete this exercise __________ times per day.  STRENGTH - Quad/VMO, Isometric  Sit in a chair with your right / left knee slightly bent. With your fingertips, feel the VMO muscle (just above the inside of your knee). The VMO is important in controlling the position of your kneecap.  Keeping your fingertips on this muscle. Without actually moving your leg, attempt to drive your knee down, as if straightening your leg. You should feel your VMO tense. If you have a difficult time, you may wish to try the same exercise on your healthy knee first.  Tense this muscle as hard as you can, without increasing any knee pain.  Hold for __________ seconds. Relax the muscles slowly and completely between each repetition. Repeat __________ times. Complete this exercise __________ times per day.  Document Released: 06/16/2005 Document Revised: 09/08/2011 Document Reviewed: 09/28/2008 [facility] Patient Information 2014  [place], [place].

## 2013-11-30 LAB — URINALYSIS, ROUTINE W REFLEX MICROSCOPIC
Bilirubin Urine: NEGATIVE
Glucose, UA: NEGATIVE mg/dL
Ketones, ur: NEGATIVE mg/dL
LEUKOCYTES UA: NEGATIVE
NITRITE: NEGATIVE
PROTEIN: NEGATIVE mg/dL
Specific Gravity, Urine: 1.01 (ref 1.005–1.030)
UROBILINOGEN UA: 0.2 mg/dL (ref 0.0–1.0)
pH: 6 (ref 5.0–8.0)

## 2013-11-30 LAB — BASIC METABOLIC PANEL WITH GFR
BUN: 10 mg/dL (ref 6–23)
CO2: 23 mEq/L (ref 19–32)
Calcium: 9.5 mg/dL (ref 8.4–10.5)
Chloride: 102 mEq/L (ref 96–112)
Creat: 0.74 mg/dL (ref 0.50–1.10)
Glucose, Bld: 84 mg/dL (ref 70–99)
Potassium: 4.2 mEq/L (ref 3.5–5.3)
SODIUM: 141 meq/L (ref 135–145)

## 2013-11-30 LAB — IRON AND TIBC
%SAT: 30 % (ref 20–55)
Iron: 138 ug/dL (ref 42–145)
TIBC: 454 ug/dL (ref 250–470)
UIBC: 316 ug/dL (ref 125–400)

## 2013-11-30 LAB — URINALYSIS, MICROSCOPIC ONLY
Bacteria, UA: NONE SEEN
Casts: NONE SEEN
Crystals: NONE SEEN
SQUAMOUS EPITHELIAL / LPF: NONE SEEN

## 2013-11-30 LAB — LIPID PANEL
CHOL/HDL RATIO: 3.3 ratio
Cholesterol: 166 mg/dL (ref 0–200)
HDL: 51 mg/dL (ref >39)
LDL CALC: 74 mg/dL (ref 0–99)
TRIGLYCERIDES: 206 mg/dL — AB (ref <150)
VLDL: 41 mg/dL — ABNORMAL HIGH (ref 0–40)

## 2013-11-30 LAB — TSH: TSH: 1.717 u[IU]/mL (ref 0.350–4.500)

## 2013-11-30 LAB — INSULIN, FASTING: Insulin fasting, serum: 24 u[IU]/mL (ref 3–28)

## 2013-11-30 LAB — MICROALBUMIN / CREATININE URINE RATIO
Creatinine, Urine: 64.5 mg/dL
MICROALB UR: 0.63 mg/dL (ref 0.00–1.89)
Microalb Creat Ratio: 9.8 mg/g (ref 0.0–30.0)

## 2013-11-30 LAB — VITAMIN B12: Vitamin B-12: 224 pg/mL (ref 211–911)

## 2013-11-30 LAB — MAGNESIUM: Magnesium: 1.8 mg/dL (ref 1.5–2.5)

## 2013-11-30 LAB — HEPATIC FUNCTION PANEL
ALT: 16 U/L (ref 0–35)
AST: 21 U/L (ref 0–37)
Albumin: 4.1 g/dL (ref 3.5–5.2)
Alkaline Phosphatase: 50 U/L (ref 39–117)
Bilirubin, Direct: <0.1 mg/dL (ref 0.0–0.3)
Total Bilirubin: 0.2 mg/dL (ref 0.2–1.2)
Total Protein: 6.7 g/dL (ref 6.0–8.3)

## 2013-11-30 LAB — VITAMIN D 25 HYDROXY (VIT D DEFICIENCY, FRACTURES): Vit D, 25-Hydroxy: 57 ng/mL (ref 30–89)

## 2013-12-01 LAB — CYTOLOGY - PAP

## 2013-12-21 ENCOUNTER — Other Ambulatory Visit: Payer: Self-pay | Admitting: *Deleted

## 2013-12-21 MED ORDER — HYDROCHLOROTHIAZIDE 25 MG PO TABS: 25.0000 mg | ORAL_TABLET | Freq: Every day | ORAL | Status: DC

## 2014-01-05 ENCOUNTER — Telehealth: Payer: Self-pay | Admitting: Internal Medicine

## 2014-01-05 ENCOUNTER — Other Ambulatory Visit: Payer: Self-pay | Admitting: Emergency Medicine

## 2014-01-05 MED ORDER — VALSARTAN 320 MG PO TABS: 320.0000 mg | ORAL_TABLET | Freq: Every day | ORAL | Status: DC

## 2014-01-05 NOTE — Telephone Encounter (Signed)
PATIENT CALLED TO ADVISE SHE HAS RUN OUT OF DIAVAN.  CALLED REFILL TO [facility]. WOULD LIKE TO REQUEST SHORT REFILL TO PHARM: [facility]- [place]  +   Thank you, [facility] [phone] Fax [phone] you, Katrina Judeth [facility] [phone] Fax [phone]

## 2014-01-12 ENCOUNTER — Other Ambulatory Visit: Payer: Self-pay | Admitting: Internal Medicine

## 2014-01-12 ENCOUNTER — Other Ambulatory Visit: Payer: Self-pay

## 2014-01-12 DIAGNOSIS — F341 Dysthymic disorder: Secondary | ICD-10-CM

## 2014-01-12 MED ORDER — ALPRAZOLAM 0.5 MG PO TABS
0.5000 mg | ORAL_TABLET | Freq: Three times a day (TID) | ORAL | Status: DC | PRN
Start: 2014-01-12 — End: 2014-05-23

## 2014-01-31 ENCOUNTER — Ambulatory Visit (INDEPENDENT_AMBULATORY_CARE_PROVIDER_SITE_OTHER): Payer: BC Managed Care – PPO | Admitting: Internal Medicine

## 2014-01-31 ENCOUNTER — Other Ambulatory Visit: Payer: Self-pay | Admitting: Internal Medicine

## 2014-01-31 ENCOUNTER — Encounter: Payer: Self-pay | Admitting: Internal Medicine

## 2014-01-31 VITALS — BP 134/80 | HR 84 | Temp 97.7°F | Resp 16 | Ht 63.5 in | Wt 163.2 lb

## 2014-01-31 DIAGNOSIS — M25511 Pain in right shoulder: Secondary | ICD-10-CM

## 2014-01-31 DIAGNOSIS — R2231 Localized swelling, mass and lump, right upper limb: Secondary | ICD-10-CM

## 2014-01-31 DIAGNOSIS — M25549 Pain in joints of unspecified hand: Secondary | ICD-10-CM

## 2014-01-31 DIAGNOSIS — M25541 Pain in joints of right hand: Secondary | ICD-10-CM

## 2014-01-31 LAB — RHEUMATOID FACTOR

## 2014-01-31 LAB — SEDIMENTATION RATE: SED RATE: 1 mm/h (ref 0–22)

## 2014-01-31 MED ORDER — PREDNISONE 20 MG PO TABS: ORAL_TABLET | ORAL | Status: DC

## 2014-01-31 MED ORDER — TRAMADOL HCL 50 MG PO TABS: 50.0000 mg | ORAL_TABLET | Freq: Four times a day (QID) | ORAL | Status: DC | PRN

## 2014-01-31 NOTE — Patient Instructions (Signed)

## 2014-01-31 NOTE — Progress Notes (Signed)
   Subjective:    Patient ID: Valerie Hobbs, female    DOB: [birth year], 53 y.o.   MRN: [id number]  HPI Patient presents with tender joints of Rt hand & fingers . 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  Medication Sig  . ALPRAZolam (XANAX) 0.5 MG tablet Take 1 tablet (0.5 mg total) by mouth 3 (three) times daily as needed for anxiety.  . Ascorbic Acid (VITAMIN C PO) Take by mouth daily.  Marland Kitchen atenolol (TENORMIN) 50 MG tablet Take 1 tablet (50 mg total) by mouth daily.  . hydrochlorothiazide (HYDRODIURIL) 25 MG tablet Take 1 tablet (25 mg total) by mouth daily.  . Lactobacillus (ACIDOPHILUS PO) Take by mouth daily.  Marland Kitchen levonorgestrel-ethinyl estradiol (LEVORA 0.15/30, 28,) 0.15-30 MG-MCG tablet Take 1 tablet by mouth daily.  Marland Kitchen LYSINE PO Take by mouth daily.  Marland Kitchen MAGNESIUM PO Take 400 mg by mouth daily.   . meloxicam (MOBIC) 15 MG tablet Once daily with food for pain  . montelukast (SINGULAIR) 10 MG tablet Take 10 mg by mouth at bedtime.  Marland Kitchen omeprazole (PRILOSEC) 20 MG capsule Take 20 mg by mouth 2 (two) times daily before a meal.  . traMADol (ULTRAM) 50 MG tablet Take 1 tablet (50 mg total) by mouth every 6 (six) hours as needed.  . valACYclovir (VALTREX) 500 MG tablet Take 500 mg by mouth daily as needed.  . valsartan (DIOVAN) 320 MG tablet Take 1 tablet (320 mg total) by mouth daily.   Allergies  Allergen Reactions  . Ciprofloxacin Nausea Only    Headache  . Penicillins    Past Medical History  Diagnosis Date  . Hypertension   . Allergy   . Anemia   . Anxiety   . Asthma    Review of Systems In addition to the HPI above,  No Fever-chills,  No Headache, No changes with Vision or hearing,  No problems swallowing food or Liquids,  No Chest pain or productive Cough or Shortness of Breath,  No Abdominal pain, No Nausea or Vomitting, Bowel movements are regular,  No Blood in stool or Urine,  No  dysuria,  No new skin rashes or bruises,   No new weakness, tingling, numbness in any extremity,  No recent weight loss,  No polyuria, polydypsia or polyphagia,  No significant Mental Stressors.  A full 10 point Review of Systems was done, except as stated above, all other Review of Systems were negative  Objective:   Physical Exam BP 134/80  Pulse 84  Temp(Src) 97.7 F (36.5 C) (Temporal)  Resp 16  Ht 5' 3.5" (1.613 m)  Wt 163 lb 3.2 oz (74.027 kg)  BMI 28.45 kg/m2  Exam focused on the right hand finds tenderness of the MCP of the right index & long fingers and the PIP & DIP of the  Right index finger and a ? Nodule over the DIP of the right index finger. No obvious joint swellingor erythemia.  Assessment & Plan:   1. Arthralgia of hand, right  - Sedimentation rate - Anti-DNA antibody, double-stranded - ANA - C3 and C4 - Cyclic citrul peptide antibody, IgG - Rheumatoid factor  Rx - Prednisone pulse taper pending labs.

## 2014-02-01 LAB — ANA: Anti Nuclear Antibody(ANA): NEGATIVE

## 2014-02-01 LAB — CYCLIC CITRUL PEPTIDE ANTIBODY, IGG: Cyclic Citrullin Peptide Ab: <2 U/mL (ref 0.0–5.0)

## 2014-02-01 LAB — C3 AND C4
C3 Complement: 129 mg/dL (ref 90–180)
C4 COMPLEMENT: 25 mg/dL (ref 10–40)

## 2014-02-01 LAB — ANTI-DNA ANTIBODY, DOUBLE-STRANDED: ds DNA Ab: 3 IU/mL

## 2014-02-18 ENCOUNTER — Other Ambulatory Visit: Payer: Self-pay | Admitting: Internal Medicine

## 2014-02-22 ENCOUNTER — Other Ambulatory Visit: Payer: Self-pay | Admitting: *Deleted

## 2014-02-22 MED ORDER — ATENOLOL 50 MG PO TABS: ORAL_TABLET | ORAL | Status: DC

## 2014-02-23 ENCOUNTER — Other Ambulatory Visit: Payer: Self-pay | Admitting: *Deleted

## 2014-02-23 MED ORDER — ATENOLOL 50 MG PO TABS: ORAL_TABLET | ORAL | Status: DC

## 2014-02-28 ENCOUNTER — Other Ambulatory Visit: Payer: Self-pay | Admitting: *Deleted

## 2014-02-28 MED ORDER — VALSARTAN 320 MG PO TABS: 320.0000 mg | ORAL_TABLET | Freq: Every day | ORAL | Status: DC

## 2014-04-05 ENCOUNTER — Other Ambulatory Visit: Payer: Self-pay

## 2014-04-05 DIAGNOSIS — Z1239 Encounter for other screening for malignant neoplasm of breast: Secondary | ICD-10-CM

## 2014-04-06 ENCOUNTER — Other Ambulatory Visit: Payer: Self-pay | Admitting: Physician Assistant

## 2014-04-06 DIAGNOSIS — M25511 Pain in right shoulder: Secondary | ICD-10-CM

## 2014-04-06 DIAGNOSIS — R2231 Localized swelling, mass and lump, right upper limb: Secondary | ICD-10-CM

## 2014-04-06 MED ORDER — TRAMADOL HCL 50 MG PO TABS: 50.0000 mg | ORAL_TABLET | Freq: Four times a day (QID) | ORAL | Status: DC | PRN

## 2014-04-07 MED ORDER — TRAMADOL HCL 50 MG PO TABS: 50.0000 mg | ORAL_TABLET | Freq: Four times a day (QID) | ORAL | Status: DC | PRN

## 2014-04-09 ENCOUNTER — Other Ambulatory Visit: Payer: Self-pay | Admitting: Physician Assistant

## 2014-04-17 ENCOUNTER — Other Ambulatory Visit: Payer: Self-pay | Admitting: Physician Assistant

## 2014-04-17 ENCOUNTER — Other Ambulatory Visit: Payer: Self-pay

## 2014-04-17 MED ORDER — CITALOPRAM HYDROBROMIDE 20 MG PO TABS
20.0000 mg | ORAL_TABLET | Freq: Every day | ORAL | Status: DC
Start: 2014-04-17 — End: 2014-05-14

## 2014-04-17 NOTE — Telephone Encounter (Signed)
Patient called office today, wants to restart Celexa , per Vicie Mutters, PA made patient aware that she sent 20 mg # 30 to pharmacy and that she will need to follow up for refills it has been some time since she has been on this medication. Patient aware and will call to schedule follow up could not at this time.

## 2014-04-19 ENCOUNTER — Ambulatory Visit: Payer: BC Managed Care – PPO

## 2014-05-14 ENCOUNTER — Other Ambulatory Visit: Payer: Self-pay | Admitting: Physician Assistant

## 2014-05-23 ENCOUNTER — Other Ambulatory Visit: Payer: Self-pay | Admitting: *Deleted

## 2014-05-23 DIAGNOSIS — F341 Dysthymic disorder: Secondary | ICD-10-CM

## 2014-05-23 MED ORDER — ALPRAZOLAM 0.5 MG PO TABS: 0.5000 mg | ORAL_TABLET | Freq: Three times a day (TID) | ORAL | Status: DC | PRN

## 2014-07-03 ENCOUNTER — Other Ambulatory Visit: Payer: Self-pay | Admitting: *Deleted

## 2014-07-03 MED ORDER — MONTELUKAST SODIUM 10 MG PO TABS: 10.0000 mg | ORAL_TABLET | Freq: Every day | ORAL | Status: DC

## 2014-07-19 ENCOUNTER — Other Ambulatory Visit: Payer: Self-pay | Admitting: *Deleted

## 2014-07-19 MED ORDER — MONTELUKAST SODIUM 10 MG PO TABS: 10.0000 mg | ORAL_TABLET | Freq: Every day | ORAL | Status: DC

## 2014-08-21 ENCOUNTER — Other Ambulatory Visit: Payer: Self-pay | Admitting: *Deleted

## 2014-08-21 DIAGNOSIS — N951 Menopausal and female climacteric states: Secondary | ICD-10-CM

## 2014-08-21 MED ORDER — LEVONORGESTREL-ETHINYL ESTRAD 0.15-30 MG-MCG PO TABS: 1.0000 | ORAL_TABLET | Freq: Every day | ORAL | Status: DC

## 2014-08-30 ENCOUNTER — Other Ambulatory Visit: Payer: Self-pay | Admitting: *Deleted

## 2014-08-30 ENCOUNTER — Other Ambulatory Visit: Payer: Self-pay

## 2014-08-30 DIAGNOSIS — F341 Dysthymic disorder: Secondary | ICD-10-CM

## 2014-08-30 MED ORDER — VALSARTAN 320 MG PO TABS: 320.0000 mg | ORAL_TABLET | Freq: Every day | ORAL | Status: DC

## 2014-08-30 MED ORDER — ALPRAZOLAM 0.5 MG PO TABS: 0.5000 mg | ORAL_TABLET | Freq: Three times a day (TID) | ORAL | Status: DC | PRN

## 2014-10-16 ENCOUNTER — Other Ambulatory Visit: Payer: Self-pay | Admitting: *Deleted

## 2014-10-16 DIAGNOSIS — F341 Dysthymic disorder: Secondary | ICD-10-CM

## 2014-10-16 MED ORDER — ALPRAZOLAM 0.5 MG PO TABS
0.5000 mg | ORAL_TABLET | Freq: Three times a day (TID) | ORAL | Status: DC | PRN
Start: 2014-10-16 — End: 2014-11-30

## 2014-10-23 ENCOUNTER — Other Ambulatory Visit: Payer: Self-pay | Admitting: Physician Assistant

## 2014-10-23 MED ORDER — CITALOPRAM HYDROBROMIDE 20 MG PO TABS: 20.0000 mg | ORAL_TABLET | Freq: Every day | ORAL | Status: DC

## 2014-11-09 ENCOUNTER — Other Ambulatory Visit: Payer: Self-pay

## 2014-11-09 MED ORDER — ATENOLOL 50 MG PO TABS: ORAL_TABLET | ORAL | Status: DC

## 2014-11-10 ENCOUNTER — Other Ambulatory Visit: Payer: Self-pay

## 2014-11-10 DIAGNOSIS — Z1231 Encounter for screening mammogram for malignant neoplasm of breast: Secondary | ICD-10-CM

## 2014-11-15 ENCOUNTER — Ambulatory Visit
Admission: RE | Admit: 2014-11-15 | Discharge: 2014-11-15 | Disposition: A | Discharge status: Home / Self Care | Payer: 59 | Source: Ambulatory Visit

## 2014-11-15 DIAGNOSIS — Z1231 Encounter for screening mammogram for malignant neoplasm of breast: Secondary | ICD-10-CM

## 2014-11-30 ENCOUNTER — Other Ambulatory Visit: Payer: Self-pay | Admitting: *Deleted

## 2014-11-30 DIAGNOSIS — F341 Dysthymic disorder: Secondary | ICD-10-CM

## 2014-11-30 MED ORDER — ALPRAZOLAM 0.5 MG PO TABS: 0.5000 mg | ORAL_TABLET | Freq: Three times a day (TID) | ORAL | Status: DC | PRN

## 2014-11-30 MED ORDER — HYDROCHLOROTHIAZIDE 25 MG PO TABS: 25.0000 mg | ORAL_TABLET | Freq: Every day | ORAL | Status: DC

## 2014-12-03 ENCOUNTER — Other Ambulatory Visit: Payer: Self-pay | Admitting: Internal Medicine

## 2014-12-04 ENCOUNTER — Other Ambulatory Visit: Payer: Self-pay

## 2014-12-04 DIAGNOSIS — F341 Dysthymic disorder: Secondary | ICD-10-CM

## 2014-12-04 MED ORDER — ALPRAZOLAM 0.5 MG PO TABS: 0.5000 mg | ORAL_TABLET | Freq: Three times a day (TID) | ORAL | Status: DC | PRN

## 2014-12-04 MED ORDER — HYDROCHLOROTHIAZIDE 25 MG PO TABS
25.0000 mg | ORAL_TABLET | Freq: Every day | ORAL | Status: DC
Start: 2014-12-04 — End: 2015-07-20

## 2014-12-07 ENCOUNTER — Ambulatory Visit (INDEPENDENT_AMBULATORY_CARE_PROVIDER_SITE_OTHER): Payer: 59 | Admitting: Physician Assistant

## 2014-12-07 ENCOUNTER — Encounter: Payer: Self-pay | Admitting: Physician Assistant

## 2014-12-07 ENCOUNTER — Other Ambulatory Visit: Payer: Self-pay | Admitting: Physician Assistant

## 2014-12-07 VITALS — BP 144/80 | HR 84 | Temp 98.4°F | Resp 18 | Ht 63.5 in | Wt 172.0 lb

## 2014-12-07 DIAGNOSIS — M25511 Pain in right shoulder: Secondary | ICD-10-CM

## 2014-12-07 DIAGNOSIS — R0683 Snoring: Secondary | ICD-10-CM

## 2014-12-07 DIAGNOSIS — I10 Essential (primary) hypertension: Secondary | ICD-10-CM

## 2014-12-07 DIAGNOSIS — G4700 Insomnia, unspecified: Secondary | ICD-10-CM

## 2014-12-07 DIAGNOSIS — R6889 Other general symptoms and signs: Secondary | ICD-10-CM

## 2014-12-07 DIAGNOSIS — Z0001 Encounter for general adult medical examination with abnormal findings: Secondary | ICD-10-CM

## 2014-12-07 DIAGNOSIS — F419 Anxiety disorder, unspecified: Secondary | ICD-10-CM

## 2014-12-07 DIAGNOSIS — E785 Hyperlipidemia, unspecified: Secondary | ICD-10-CM

## 2014-12-07 DIAGNOSIS — T7840XD Allergy, unspecified, subsequent encounter: Secondary | ICD-10-CM

## 2014-12-07 DIAGNOSIS — E559 Vitamin D deficiency, unspecified: Secondary | ICD-10-CM

## 2014-12-07 DIAGNOSIS — E876 Hypokalemia: Secondary | ICD-10-CM

## 2014-12-07 DIAGNOSIS — D649 Anemia, unspecified: Secondary | ICD-10-CM

## 2014-12-07 DIAGNOSIS — J45909 Unspecified asthma, uncomplicated: Secondary | ICD-10-CM

## 2014-12-07 LAB — CBC WITH DIFFERENTIAL/PLATELET
BASOS PCT: 1 % (ref 0–1)
Basophils Absolute: 0.1 10*3/uL (ref 0.0–0.1)
EOS ABS: 0.4 10*3/uL (ref 0.0–0.7)
EOS PCT: 3 % (ref 0–5)
HEMATOCRIT: 37.2 % (ref 36.0–46.0)
Hemoglobin: 12.1 g/dL (ref 12.0–15.0)
Lymphocytes Relative: 24 % (ref 12–46)
Lymphs Abs: 3 10*3/uL (ref 0.7–4.0)
MCH: 32.1 pg (ref 26.0–34.0)
MCHC: 32.5 g/dL (ref 30.0–36.0)
MCV: 98.7 fL (ref 78.0–100.0)
MONO ABS: 1 10*3/uL (ref 0.1–1.0)
MONOS PCT: 8 % (ref 3–12)
MPV: 10.7 fL (ref 8.6–12.4)
NEUTROS ABS: 7.9 10*3/uL — AB (ref 1.7–7.7)
NEUTROS PCT: 64 % (ref 43–77)
PLATELETS: 279 10*3/uL (ref 150–400)
RBC: 3.77 MIL/uL — AB (ref 3.87–5.11)
RDW: 14.3 % (ref 11.5–15.5)
WBC: 12.3 10*3/uL — AB (ref 4.0–10.5)

## 2014-12-07 LAB — HEPATIC FUNCTION PANEL
ALBUMIN: 4 g/dL (ref 3.5–5.2)
ALT: 25 U/L (ref 0–35)
AST: 23 U/L (ref 0–37)
Alkaline Phosphatase: 68 U/L (ref 39–117)
BILIRUBIN TOTAL: 0.4 mg/dL (ref 0.2–1.2)
Bilirubin, Direct: 0.1 mg/dL (ref 0.0–0.3)
Indirect Bilirubin: 0.3 mg/dL (ref 0.2–1.2)
TOTAL PROTEIN: 6.7 g/dL (ref 6.0–8.3)

## 2014-12-07 LAB — LIPID PANEL
Cholesterol: 164 mg/dL (ref 0–200)
HDL: 57 mg/dL (ref >=46)
LDL Cholesterol: 81 mg/dL (ref 0–99)
TRIGLYCERIDES: 131 mg/dL (ref <150)
Total CHOL/HDL Ratio: 2.9 Ratio
VLDL: 26 mg/dL (ref 0–40)

## 2014-12-07 LAB — BASIC METABOLIC PANEL WITH GFR
BUN: 10 mg/dL (ref 6–23)
CHLORIDE: 105 meq/L (ref 96–112)
CO2: 22 mEq/L (ref 19–32)
Calcium: 9.1 mg/dL (ref 8.4–10.5)
Creat: 0.75 mg/dL (ref 0.50–1.10)
GFR, Est African American: >89 mL/min
GFR, Est Non African American: >89 mL/min
Glucose, Bld: 91 mg/dL (ref 70–99)
POTASSIUM: 3.5 meq/L (ref 3.5–5.3)
Sodium: 138 mEq/L (ref 135–145)

## 2014-12-07 LAB — MAGNESIUM: Magnesium: 2.1 mg/dL (ref 1.5–2.5)

## 2014-12-07 LAB — TSH: TSH: 1.524 u[IU]/mL (ref 0.350–4.500)

## 2014-12-07 MED ORDER — BUPROPION HCL ER (XL) 150 MG PO TB24: 150.0000 mg | ORAL_TABLET | ORAL | Status: DC

## 2014-12-07 MED ORDER — TRAMADOL HCL 50 MG PO TABS: 50.0000 mg | ORAL_TABLET | Freq: Four times a day (QID) | ORAL | Status: DC | PRN

## 2014-12-07 MED ORDER — ALBUTEROL SULFATE HFA 108 (90 BASE) MCG/ACT IN AERS: 1.0000 | INHALATION_SPRAY | Freq: Four times a day (QID) | RESPIRATORY_TRACT | Status: DC | PRN

## 2014-12-07 NOTE — Patient Instructions (Addendum)
Before you even begin to attack a weight-loss plan, it pays to remember this: You are not fat. You have fat. Losing weight isn't about blame or shame; it's simply another achievement to accomplish. Dieting is like any other skill-you have to buckle down and work at it. As long as you act in a smart, reasonable way, you'll ultimately get where you want to be. Here are some weight loss pearls for you.  1. It's Not a Diet. It's a Lifestyle Thinking of a diet as something you're on and suffering through only for the short term doesn't work. To shed weight and keep it off, you need to make permanent changes to the way you eat. It's OK to indulge occasionally, of course, but if you cut calories temporarily and then revert to your old way of eating, you'll gain back the weight quicker than you can say yo-yo. Use it to lose it. Research shows that one of the best predictors of long-term weight loss is how many pounds you drop in the first month. For that reason, nutritionists often suggest being stricter for the first two weeks of your new eating strategy to build momentum. Cut out added sugar and alcohol and avoid unrefined carbs. After that, figure out how you can reincorporate them in a way that's healthy and maintainable.  2. There's a Right Way to Exercise Working out burns calories and fat and boosts your metabolism by building muscle. But those trying to lose weight are notorious for overestimating the number of calories they burn and underestimating the amount they take in. Unfortunately, your system is biologically programmed to hold on to extra pounds and that means when you start exercising, your body senses the deficit and ramps up its hunger signals. If you're not diligent, you'll eat everything you burn and then some. Use it to lose it. Cardio gets all the exercise glory, but strength and interval training are the real heroes. They help you build lean muscle, which in turn increases your metabolism and  calorie-burning ability 3. Don't Overreact to Mild Hunger Some people have a hard time losing weight because of hunger anxiety. To them, being hungry is bad-something to be avoided at all costs-so they carry snacks with them and eat when they don't need to. Others eat because they're stressed out or bored. While you never want to get to the point of being ravenous (that's when bingeing is likely to happen), a hunger pang, a craving, or the fact that it's 3:00 p.m. should not send you racing for the vending machine or obsessing about the energy bar in your purse. Ideally, you should put off eating until your stomach is growling and it's difficult to concentrate.  Use it to lose it. When you feel the urge to eat, use the HALT method. Ask yourself, Am I really hungry? Or am I angry or anxious, lonely or bored, or tired? If you're still not certain, try the apple test. If you're truly hungry, an apple should seem delicious; if it doesn't, something else is going on. Or you can try drinking water and making yourself busy, if you are still hungry try a healthy snack.  4. Not All Calories Are Created Equal The mechanics of weight loss are pretty simple: Take in fewer calories than you use for energy. But the kind of food you eat makes all the difference. Processed food that's high in saturated fat and refined starch or sugar can cause inflammation that disrupts the hormone signals that tell  your brain you're full. The result: You eat a lot more.  Use it to lose it. Clean up your diet. Swap in whole, unprocessed foods, including vegetables, lean protein, and healthy fats that will fill you up and give you the biggest nutritional bang for your calorie buck. In a few weeks, as your brain starts receiving regular hunger and fullness signals once again, you'll notice that you feel less hungry overall and naturally start cutting back on the amount you eat.  5. Protein, Produce, and Plant-Based Fats Are Your Weight-Loss  [facility] Here's why eating the three Ps regularly will help you drop pounds. Protein fills you up. You need it to build lean muscle, which keeps your metabolism humming so that you can torch more fat. People in a weight-loss program who ate double the recommended daily allowance for protein (about 110 grams for a 150-pound woman) lost 70 percent of their weight from fat, while people who ate the RDA lost only about 40 percent, one study found. Produce is packed with filling fiber. "It's very difficult to consume too many calories if you're eating a lot of vegetables. Example: Three cups of broccoli is a lot of food, yet only 93 calories. (Fruit is another story. It can be easy to overeat and can contain a lot of calories from sugar, so be sure to monitor your intake.) Plant-based fats like olive oil and those in avocados and nuts are healthy and extra satiating.  Use it to lose it. Aim to incorporate each of the three Ps into every meal and snack. People who eat protein throughout the day are able to keep weight off, according to a study in the American Journal of Clinical Nutrition. In addition to meat, poultry and seafood, good sources are beans, lentils, eggs, tofu, and yogurt. As for fat, keep portion sizes in check by measuring out salad dressing, oil, and nut butters (shoot for one to two tablespoons). Finally, eat veggies or a little fruit at every meal. People who did that consumed 308 fewer calories but didn't feel any hungrier than when they didn't eat more produce.  7. How You Eat Is As Important As What You Eat In order for your brain to register that you're full, you need to focus on what you're eating. Sit down whenever you eat, preferably at a table. Turn off the TV or computer, put down your phone, and look at your food. Smell it. Chew slowly, and don't put another bite on your fork until you swallow. When women ate lunch this attentively, they consumed 30 percent less when snacking later than  those who listened to an audiobook at lunchtime, according to a study in the British Journal of Nutrition. 8. Weighing Yourself Really Works The scale provides the best evidence about whether your efforts are paying off. Seeing the numbers tick up or down or stagnate is motivation to keep going-or to rethink your approach. A 2015 study at [employer] found that daily weigh-ins helped people lose more weight, keep it off, and maintain that loss, even after two years. Use it to lose it. Step on the scale at the same time every day for the best results. If your weight shoots up several pounds from one weigh-in to the next, don't freak out. Eating a lot of salt the night before or having your period is the likely culprit. The number should return to normal in a day or two. It's a steady climb that you need to do something about.   9. Too Much Stress and Too Little Sleep Are Your Enemies When you're tired and frazzled, your body cranks up the production of cortisol, the stress hormone that can cause carb cravings. Not getting enough sleep also boosts your levels of ghrelin, a hormone associated with hunger, while suppressing leptin, a hormone that signals fullness and satiety. People on a diet who slept only five and a half hours a night for two weeks lost 55 percent less fat and were hungrier than those who slept eight and a half hours, according to a study in the [place]. Use it to lose it. Prioritize sleep, aiming for seven hours or more a night, which research shows helps lower stress. And make sure you're getting quality zzz's. If a snoring spouse or a fidgety cat wakes you up frequently throughout the night, you may end up getting the equivalent of just four hours of sleep, according to a study from [facility]. Keep pets out of the bedroom, and use a white-noise app to drown out snoring. 10. You Will Hit a plateau-And You Can Bust Through It As you slim down, your  body releases much less leptin, the fullness hormone.  If you're not strength training, start right now. Building muscle can raise your metabolism to help you overcome a plateau. To keep your body challenged and burning calories, incorporate new moves and more intense intervals into your workouts or add another sweat session to your weekly routine. Alternatively, cut an extra 100 calories or so a day from your diet. Now that you've lost weight, your body simply doesn't need as much fuel.   I think it is possible that you have sleep apnea. It can cause interrupted sleep, headaches, frequent awakenings, fatigue, dry mouth, fast/slow heart beats, memory issues, anxiety/depression, swelling, numbness tingling hands/feet, weight gain, shortness of breath, and the list goes on. Sleep apnea needs to be ruled out because if it is left untreated it does eventually lead to abnormal heart beats, lung failure or heart failure as well as increasing the risk of heart attack and stroke. There are masks you can wear OR a mouth piece that I can give you information about. Often times though people feel MUCH better after getting treatment.   Sleep Apnea  Sleep apnea is a sleep disorder characterized by abnormal pauses in breathing while you sleep. When your breathing pauses, the level of oxygen in your blood decreases. This causes you to move out of deep sleep and into light sleep. As a result, your quality of sleep is poor, and the system that carries your blood throughout your body (cardiovascular system) experiences stress. If sleep apnea remains untreated, the following conditions can develop:  High blood pressure (hypertension).  Coronary artery disease.  Inability to achieve or maintain an erection (impotence).  Impairment of your thought process (cognitive dysfunction). There are three types of sleep apnea: 1. Obstructive sleep apnea--Pauses in breathing during sleep because of a blocked airway. 2. Central  sleep apnea--Pauses in breathing during sleep because the area of the brain that controls your breathing does not send the correct signals to the muscles that control breathing. 3. Mixed sleep apnea--A combination of both obstructive and central sleep apnea.  RISK FACTORS The following risk factors can increase your risk of developing sleep apnea:  Being overweight.  Smoking.  Having narrow passages in your nose and throat.  Being of older age.  Being female.  Alcohol use.  Sedative and tranquilizer use.  Ethnicity.  Among individuals younger than 35 years, African Americans are at increased risk of sleep apnea. SYMPTOMS   Difficulty staying asleep.  Daytime sleepiness and fatigue.  Loss of energy.  Irritability.  Loud, heavy snoring.  Morning headaches.  Trouble concentrating.  Forgetfulness.  Decreased interest in sex. DIAGNOSIS  In order to diagnose sleep apnea, your caregiver will perform a physical examination. Your caregiver may suggest that you take a home sleep test. Your caregiver may also recommend that you spend the night in a sleep lab. In the sleep lab, several monitors record information about your heart, lungs, and brain while you sleep. Your leg and arm movements and blood oxygen level are also recorded. TREATMENT The following actions may help to resolve mild sleep apnea:  Sleeping on your side.   Using a decongestant if you have nasal congestion.   Avoiding the use of depressants, including alcohol, sedatives, and narcotics.   Losing weight and modifying your diet if you are overweight. There also are devices and treatments to help open your airway:  Oral appliances. These are custom-made mouthpieces that shift your lower jaw forward and slightly open your bite. This opens your airway.  Devices that create positive airway pressure. This positive pressure "splints" your airway open to help you breathe better during sleep. The following devices  create positive airway pressure:  Continuous positive airway pressure (CPAP) device. The CPAP device creates a continuous level of air pressure with an air pump. The air is delivered to your airway through a mask while you sleep. This continuous pressure keeps your airway open.  Nasal expiratory positive airway pressure (EPAP) device. The EPAP device creates positive air pressure as you exhale. The device consists of single-use valves, which are inserted into each nostril and held in place by adhesive. The valves create very little resistance when you inhale but create much more resistance when you exhale. That increased resistance creates the positive airway pressure. This positive pressure while you exhale keeps your airway open, making it easier to breath when you inhale again.  Bilevel positive airway pressure (BPAP) device. The BPAP device is used mainly in patients with central sleep apnea. This device is similar to the CPAP device because it also uses an air pump to deliver continuous air pressure through a mask. However, with the BPAP machine, the pressure is set at two different levels. The pressure when you exhale is lower than the pressure when you inhale.  Surgery. Typically, surgery is only done if you cannot comply with less invasive treatments or if the less invasive treatments do not improve your condition. Surgery involves removing excess tissue in your airway to create a wider passage way. Document Released: 06/06/2002 Document Revised: 10/11/2012 Document Reviewed: 10/23/2011 [facility] Patient Information 2015 [place], [place]. This information is not intended to replace advice given to you by your health care provider. Make sure you discuss any questions you have with your health care provider.     Can call Dr. Toy Cookey to get evaluated for dental sleep appliance. # [phone]  OR you can try Dr. Clearence Ped in [facility] # [phone]  We will send notes. Call and get  price quote on both.   VAGINAL DRYNESS OVERVIEW  Vaginal dryness, also known as atrophic vaginitis, is a common condition in postmenopausal women. This condition is also common in women who have had both ovaries removed at the time of hysterectomy.   Some women have uncomfortable symptoms of vaginal dryness, such as  pain with sex, burning vaginal discomfort or itching, or abnormal vaginal discharge, while others have no symptoms at all.  VAGINAL DRYNESS CAUSES   Estrogen helps to keep the vagina moist and to maintain thickness of the vaginal lining. Vaginal dryness occurs when the ovaries produce a decreased amount of estrogen. This can occur at certain times in a woman's life, and may be permanent or temporary. Times when less estrogen is made include: ?At the time of menopause. ?After surgical removal of the ovaries, chemotherapy, or radiation therapy of the pelvis for cancer. ?After having a baby, particularly in women who breastfeed. ?While using certain medications, such as danazol, medroxyprogesterone (brand names: Provera or DepoProvera), leuprolide (brand name: Lupron), or nafarelin. When these medications are stopped, estrogen production resumes.  Women who smoke cigarettes have been shown to have an increased risk of an earlier menopause transition as compared to non-smokers. Therefore, atrophic vaginitis symptoms may appear at a younger age in this population.  VAGINAL DRYNESS TREATMENT   There are three treatment options for women with vaginal dryness:  Vaginal lubricants and moisturizers - Vaginal lubricants and moisturizers can be purchased without a prescription. These products do not contain any hormones and have virtually no side effects. - Albolene is found in the facial cleanser section at [facility], [facility], or [employer]. It is a large jar with a blue top. This is the best lubricant for women because it is hypoallergenic. -Natural lubricants, such as olive, avocado or peanut oil,  are easily available products that may be used as a lubricant with sex.  -Vaginal moisturizes (eg, Replens, Moist Again, Vagisil, K-Y Silk-E, and Feminease) are formulated to allow water to be retained in the vaginal tissues. Moisturizers are applied into the vagina three times weekly to allow a continued moisturizing effect. These should not be used just before having sex, as they can be irritating.  Vaginal estrogen - Vaginal estrogen is the most effective treatment option for women with vaginal dryness. Vaginal estrogen must be prescribed by a healthcare provider. Very low doses of vaginal estrogen can be used when it is put into the vagina to treat vaginal dryness. A small amount of estrogen is absorbed into the bloodstream, but only about 100 times less than when using estrogen pills or tablets. As a result, there is a much lower risk of side effects, such as blood clots, breast cancer, and heart attack, compared with other estrogen-containing products (birth control pills, menopausal hormone therapy).   Ospemifene - Ospemifene is a prescription medication that is similar to estrogen, but is not estrogen. In the vaginal tissue, it acts similarly to estrogen. In the breast tissue, it acts as an estrogen blocker. It comes in a pill, and is prescribed for women who want to use an estrogen-like medication for vaginal dryness or painful sex associated with vaginal dryness, but prefer not to use a vaginal medication. The medication may cause hot flashes as a side effect. This type of medication may increase the risk of blood clots or uterine cancer. Further study of ospemifene is needed to evaluate the risk of these complications. This medication has not been tested in women who have had breast cancer or are at a high risk of developing breast cancer.    Sexual activity - Vaginal estrogen improves vaginal dryness quickly, usually within a few weeks. You may continue to have sex as you treat vaginal dryness  because sex itself can help to keep the vaginal tissues healthy. Vaginal intercourse may help the vaginal  tissues by keeping them soft and stretchable and preventing the tissues from shrinking.  If sex continues to be painful despite treatment for vaginal dryness, talk to your healthcare provider.     Biceps Tendon Tendinitis (Proximal) and Tenosynovitis with Rehab Tendonitis and tenosynovitis involve inflammation of the tendon and the tendon lining (sheath). The proximal biceps tendon is vulnerable to tendonitis and tenosynovitis, which causes pain and discomfort in the front of the shoulder and upper arm. The tendon lining secretes a fluid that helps lubricate the tendon, allowing for proper function without pain. When the tendon and its lining become inflamed, the tendon can no longer glide smoothly, causing pain. The proximal biceps tendon connects the biceps muscle to two bones of the shoulder. It is important for proper function of the elbow and turning the palm upward (supination) using the wrist. Proximal biceps tendon tendinitis may include a grade 1 or 2 strain of the tendon. Grade 1 strains involve a slight pull of the tendon without signs of tearing and no observed tendon lengthening. There is also no loss of strength. Grade 2 strains involve small tears in the tendon fibers. The tendon or muscle is stretched and strength is usually decreased.  SYMPTOMS   Pain, tenderness, swelling, warmth, or redness over the front of the shoulder.  Pain that gets worse with shoulder and elbow use, especially against resistance.  Limited motion of the shoulder or elbow.  Crackling sound (crepitation) when the tendon or shoulder is moved or touched. CAUSES  The symptoms of biceps tendonitis are due to inflammation of the tendon. Inflammation may be caused by:  Strain from sudden increase in amount or intensity of activity.  Direct blow or injury to the elbow (uncommon).  Overuse or repetitive  elbow bending or wrist rotation, particularly when turning the palm up, or with elbow hyperextension. RISK INCREASES WITH:  Sports that involve contact or overhead arm activity (throwing sports, gymnastics, weightlifting, bodybuilding, rock climbing).  Heavy labor.  Poor strength and flexibility.  Failure to warm up properly before activity. PREVENTION  Warm up and stretch properly before activity.  Allow time for recovery between activities.  Maintain physical fitness:  Strength, flexibility, and endurance.  Cardiovascular fitness.  Learn and use proper exercise technique. PROGNOSIS  With proper treatment, proximal biceps tendon tendonitis and tenosynovitis is usually curable within 6 weeks. Healing is usually quicker if the cause was a direct blow, not overuse.  RELATED COMPLICATIONS   Longer healing time if not properly treated or if not given enough time to heal.  Chronically inflamed tendon that causes persistent pain with activity, that may progress to constant pain and potentially rupture of the tendon.  Recurring symptoms, especially if activity is resumed too soon or with overuse, a direct blow, or use of poor exercise technique. TREATMENT Treatment first involves ice and medicine, to reduce pain and inflammation. It is helpful to modify activities that cause pain, to reduce the chances of causing the condition to get worse. Strengthening and stretching exercises should be performed to promote proper use of the muscles of the shoulder. These exercises may be performed at home or with a therapist. Other treatments may be given such as ultrasound or heat therapy. A corticosteroid injection may be recommended to help reduce inflammation of the tendon lining. Surgery is usually not necessary. Sometimes, if symptoms last for greater than 6 months, surgery will be advised to detach the tendon and re-insert it into the arm bone. Surgery to correct other shoulder problems  that may  be contributing to tendinitis may be advised before surgery for the tendinitis itself.  MEDICATION  If pain medicine is needed, nonsteroidal anti-inflammatory medicines (aspirin and ibuprofen), or other minor pain relievers (acetaminophen), are often advised.  Do not take pain medicine for 7 days before surgery.  Prescription pain relievers may be given if your caregiver thinks they are needed. Use only as directed and only as much as you need.  Corticosteroid injections may be given. These injections should only be used on the most severe cases, as one can only receive a limited number of them. HEAT AND COLD   Cold treatment (icing) should be applied for 10 to 15 minutes every 2 to 3 hours for inflammation and pain, and immediately after activity that aggravates your symptoms. Use ice packs or an ice massage.  Heat treatment may be used before performing stretching and strengthening activities prescribed by your caregiver, physical therapist, or athletic trainer. Use a heat pack or a warm water soak. SEEK MEDICAL CARE IF:   Symptoms get worse or do not improve in 2 weeks, despite treatment.  New, unexplained symptoms develop. (Drugs used in treatment may produce side effects.) EXERCISES RANGE OF MOTION (ROM) AND EXERCISES - Biceps Tendon (Proximal) and Tenosynovitis These exercises may help you when beginning to rehabilitate your injury. Your symptoms may go away with or without further involvement from your physician, physical therapist, or athletic trainer. While completing these exercises, remember:   Restoring tissue flexibility helps normal motion to return to the joints. This allows healthier, less painful movement and activity.  An effective stretch should be held for at least 30 seconds.  A stretch should never be painful. You should only feel a gentle lengthening or release in the stretched tissue. STRETCH - Flexion, Standing  Stand with good posture. With an underhand grip  on your right / left hand and an overhand grip on the opposite hand, grasp a broomstick or cane so that your hands are a little more than shoulder width apart.  Keeping your right / left elbow straight and shoulder muscles relaxed, push the stick with your opposite hand to raise your right / left arm in front of your body and then overhead. Raise your arm until you feel a stretch in your right / left shoulder, but before you have increased shoulder pain.  Try to avoid shrugging your right / left shoulder as your arm rises, by keeping your shoulder blade tucked down and toward your mid-back spine. Hold for __________ seconds.  Slowly return to the starting position. Repeat __________ times. Complete this exercise __________ times per day. STRETCH - Abduction, Supine  Lie on your back. With an underhand grip on your right / left hand and an overhand grip on the opposite hand, grasp a broomstick or cane so that your hands are a little more than shoulder width apart.  Keeping your right / left elbow straight and shoulder muscles relaxed, push the stick with your opposite hand to raise your right / left arm out to the side of your body and then overhead. Raise your arm until you feel a stretch in your right / left shoulder, but before you have increased shoulder pain.  Try to avoid shrugging your right / left shoulder as your arm rises, by keeping your shoulder blade tucked down and toward your mid-back spine. Hold for __________ seconds.  Slowly return to the starting position. Repeat __________ times. Complete this exercise __________ times per day. ROM -  Flexion, Active-Assisted  Lie on your back. You may bend your knees for comfort.  Grasp a broomstick or cane so your hands are about shoulder width apart. Your right / left hand should grip the end of the stick so that your hand is positioned "thumbs-up," as if you were about to shake hands.  Using your healthy arm to lead, raise your right /  left arm overhead until you feel a gentle stretch in your shoulder. Hold for __________ seconds.  Use the stick to assist in returning your right / left arm to its starting position. Repeat __________ times. Complete this exercise __________ times per day.  STRETCH - Flexion, Standing   Stand facing a wall. Walk your right / left fingers up the wall until you feel a moderate stretch in your shoulder. As your hand gets higher, you may need to step closer to the wall or use a door frame to walk through.  Try to avoid shrugging your right / left shoulder as your arm rises, by keeping your shoulder blade tucked down and toward your mid-back spine.  Hold for __________ seconds. Use your other hand, if needed, to ease out of the stretch and return to the starting position. Repeat __________ times. Complete this exercise __________ times per day.  ROM - Internal Rotation   Using underhand grips, grasp a stick behind your back with both hands.  While standing upright with good posture, slide the stick up your back until you feel a mild stretch in the front of your shoulder.  Hold for __________ seconds. Slowly return to your starting position. Repeat __________ times. Complete this exercise __________ times per day.  STRETCH - Internal Rotation  Place your right / left hand behind your back, palm-up.  Throw a towel or belt over your opposite shoulder. Grasp the towel with your right / left hand.  While keeping an upright posture, gently pull up on the towel until you feel a stretch in the front of your right / left shoulder.  Avoid shrugging your right / left shoulder as your arm rises, by keeping your shoulder blade tucked down and toward your mid-back spine.  Hold for __________ seconds. Release the stretch by lowering your opposite hand. Repeat __________ times. Complete this exercise __________ times per day. STRENGTHENING EXERCISES - Biceps Tendon Tendinitis (Proximal) and  Tenosynovitis These exercises may help you regain your strength after your physician has discontinued your restraint in a cast or brace. They may resolve your symptoms with or without further involvement from your physician, physical therapist or athletic trainer. While completing these exercises, remember:   Muscles can gain both the endurance and the strength needed for everyday activities through controlled exercises.  Complete these exercises as instructed by your physician, physical therapist or athletic trainer. Increase the resistance and repetitions only as guided.  You may experience muscle soreness or fatigue, but the pain or discomfort you are trying to eliminate should never worsen during these exercises. If this pain does get worse, stop and make sure you are following the directions exactly. If the pain is still present after adjustments, discontinue the exercise until you can discuss the trouble with your caregiver. STRENGTH - Elbow Flexors, Isometric  Stand or sit upright on a firm surface. Place your right / left arm so that your hand is palm-up and at the height of your waist.  Place your opposite hand on top of your forearm. Gently push down as your right / left arm resists. Push  as hard as you can with both arms, without causing any pain or movement at your right / left elbow. Hold this stationary position for __________ seconds.  Gradually release the tension in both arms. Allow your muscles to relax completely before repeating. Repeat __________ times. Complete this exercise __________ times per day. STRENGTH - Shoulder Flexion, Isometric  With good posture and facing a wall, stand or sit about 4-6 inches away.  Keeping your right / left elbow straight, gently press the top of your fist into the wall. Increase the pressure gradually until you are pressing as hard as you can, without shrugging your shoulder or increasing any shoulder discomfort.  Hold for __________  seconds.  Release the tension slowly. Relax your shoulder muscles completely before you start the next repetition. Repeat __________ times. Complete this exercise __________ times per day.  STRENGTH - Elbow Flexors, Supinated  With good posture, stand or sit on a firm chair without armrests. Allow your right / left arm to rest at your side with your palm facing forward.  Holding a __________ weight, or gripping a rubber exercise band or tubing,  bring your hand toward your shoulder.  Allow your muscles to control the resistance as your hand returns to your side. Repeat __________ times. Complete this exercise __________ times per day.  STRENGTH - Shoulder Flexion  Stand or sit with good posture. Grasp a __________ weight, or an exercise band or tubing, so that your hand is "thumbs-up," like when you shake hands.  Slowly lift your right / left arm as far as you can, without increasing any shoulder pain. At first, many people can only raise their hand to shoulder height.  Avoid shrugging your right / left shoulder as your arm rises, by keeping your shoulder blade tucked down and toward your mid-back spine.  Hold for __________ seconds. Control the descent of your hand as you slowly return to your starting position. Repeat __________ times. Complete this exercise __________ times per day. Document Released: 06/16/2005 Document Revised: 09/08/2011 Document Reviewed: 09/28/2008 [facility] Patient Information 2015 [place], [place]. This information is not intended to replace advice given to you by your health care provider. Make sure you discuss any questions you have with your health care provider.

## 2014-12-07 NOTE — Progress Notes (Signed)
Complete Physical  Assessment and Plan: 1. Essential hypertension - continue medications, DASH diet, exercise and monitor at home. Call if greater than 130/80. - CBC with Differential/Platelet - BASIC METABOLIC PANEL WITH GFR - Hepatic function panel - TSH - Urinalysis, Routine w reflex microscopic (not at [facility]) - Microalbumin / creatinine urine ratio - EKG 12-Lead  2. Anemia, unspecified anemia type - monitor, continue iron supp with Vitamin C and increase green leafy veggies  3. Anxiety Check to see if need BCP - FSH  4. Asthma, unspecified asthma severity, uncomplicated Continue singulair, albuterol PRN, avoid triggers.   5. Allergy, subsequent encounter Continue OTC allergy pills  6. Hypokalemia - Magnesium  7. Snoring Declines sleep study at this time, will monitor  8. Insomnia Insomnia- good sleep hygiene discussed, increase day time activity, try melatonin or benadryl if this does not help we will call in sleep medication.   9. Nontraumatic pain of right shoulder Likely bicep tendonitis, RICE, exercises given - traMADol (ULTRAM) 50 MG tablet; Take 1 tablet (50 mg total) by mouth every 6 (six) hours as needed.  Dispense: 90 tablet; Refill: 0  10. Hyperlipidemia - Lipid panel  11. Vitamin D deficiency - Vit D  25 hydroxy (rtn osteoporosis monitoring)   Discussed med's effects and SE's. Screening labs and tests as requested with regular follow-up as recommended.  HPI 54 y.o. female  presents for a complete physical. Her blood pressure has been controlled at home, takes HCTZ daily but has been out for 7-10 days is cutting in half, today their BP is BP: (!) 144/80 mmHg She does workout, walks the stairs at work. She denies chest pain, shortness of breath, dizziness.  She is not on cholesterol medication and denies myalgias. Her cholesterol is at goal. The cholesterol last visit was:   Lab Results  Component Value Date   CHOL 166 11/29/2013   HDL 51 11/29/2013    LDLCALC 74 11/29/2013   TRIG 206* 11/29/2013   CHOLHDL 3.3 11/29/2013   Last A1C in the office was:  Lab Results  Component Value Date   HGBA1C 5.3 11/29/2013   Patient is on Vitamin D supplement.   Lab Results  Component Value Date   VD25OH 83 11/29/2013   Her mother has increased her stress, she is on medicaid and has gotten her into an assisted living care facility, she did have a fall. She is on xanax,  Was on celexa but off due to sexual side effects has been taking it at night but still waking up due to stress. She was having issues with her husband but that has improved.  She also recently re injuried her right shoulder trying to pick up mothers wheel chair.  Lab Results  Component Value Date   CREATININE 0.74 11/29/2013   BUN 10 11/29/2013   NA 141 11/29/2013   K 4.2 11/29/2013   CL 102 11/29/2013   CO2 23 11/29/2013    Current Medications:  Current Outpatient Prescriptions on File Prior to Visit  Medication Sig Dispense Refill  . ALPRAZolam (XANAX) 0.5 MG tablet Take 1 tablet (0.5 mg total) by mouth 3 (three) times daily as needed for anxiety. 90 tablet 5  . Ascorbic Acid (VITAMIN C PO) Take by mouth daily.    Marland Kitchen atenolol (TENORMIN) 50 MG tablet TAKE 1 BY MOUTH DAILY 90 tablet 3  . hydrochlorothiazide (HYDRODIURIL) 25 MG tablet Take 1 tablet (25 mg total) by mouth daily. 90 tablet 3  . levonorgestrel-ethinyl estradiol (NORDETTE)  0.15-30 MG-MCG tablet Take 1 tablet by mouth  daily 84 tablet PRN  . MAGNESIUM PO Take 400 mg by mouth daily.     . meloxicam (MOBIC) 15 MG tablet Once daily with food for pain 90 tablet 1  . montelukast (SINGULAIR) 10 MG tablet Take 1 tablet (10 mg total) by mouth at bedtime. 90 tablet 4  . omeprazole (PRILOSEC) 20 MG capsule Take 20 mg by mouth 2 (two) times daily before a meal.    . traMADol (ULTRAM) 50 MG tablet Take 1 tablet (50 mg total) by mouth every 6 (six) hours as needed. 90 tablet 0  . valACYclovir (VALTREX) 500 MG tablet Take 500  mg by mouth daily as needed.    . valsartan (DIOVAN) 320 MG tablet Take 1 tablet (320 mg total) by mouth daily. 90 tablet 1   No current facility-administered medications on file prior to visit.   Health Maintenance:   Immunization History  Administered Date(s) Administered  . Pneumococcal Polysaccharide-23 06/30/1996  . Tdap 11/16/2012   Tetanus: 2014 Pneumovax: 1998 Prevnar 13: declines Flu vaccine: declines Zostavax: N/A Pap: 2015 Negative due 3 years, never abnormal test MGM: 10/2014  DEXA: N/A Colonoscopy: 2011 due 2021 EGD: N/A  Allergies:  Allergies  Allergen Reactions  . Ciprofloxacin Nausea Only    Headache  . Penicillins    Medical History:  Past Medical History  Diagnosis Date  . Hypertension   . Allergy   . Anemia   . Anxiety   . Asthma    Surgical History: No past surgical history on file. Family History:  Family History  Problem Relation Age of Onset  . Hypertension Mother   . Diabetes Mother   . Thyroid disease Mother   . Cataracts Mother   . Diabetes Father   . Hypertension Father   . Heart disease Father    Social History:  History  Substance Use Topics  . Smoking status: Never Smoker   . Smokeless tobacco: Never Used  . Alcohol Use: 7.0 oz/week    14 drink(s) per week     Comment: On occasion   Review of Systems  Constitutional: Negative.   HENT: Negative.   Eyes: Negative.   Respiratory: Negative.   Cardiovascular: Negative.   Gastrointestinal: Negative.   Genitourinary: Negative.   Musculoskeletal: Positive for joint pain. Negative for myalgias, back pain, falls and neck pain.  Skin: Negative.   Neurological: Negative.   Endo/Heme/Allergies: Negative.   Psychiatric/Behavioral: Positive for depression. Negative for suicidal ideas, hallucinations, memory loss and substance abuse. The patient is nervous/anxious. The patient does not have insomnia.      Physical Exam: Estimated body mass index is 29.99 kg/(m^2) as calculated  from the following:   Height as of this encounter: 5' 3.5" (1.613 m).   Weight as of this encounter: 172 lb (78.019 kg). BP 144/80 mmHg  Pulse 84  Temp(Src) 98.4 F (36.9 C) (Temporal)  Resp 18  Ht 5' 3.5" (1.613 m)  Wt 172 lb (78.019 kg)  BMI 29.99 kg/m2  LMP 12/03/2014 General Appearance: Well nourished, in no apparent distress. Eyes: PERRLA, EOMs, conjunctiva no swelling or erythema, normal fundi and vessels. Sinuses: No Frontal/maxillary tenderness ENT/Mouth: Ext aud canals clear, normal light reflex with TMs without erythema, bulging.  Good dentition. No erythema, swelling, or exudate on post pharynx. Tonsils not swollen or erythematous. Hearing normal.  Neck: Supple, thyroid normal. No bruits Respiratory: Respiratory effort normal, BS equal bilaterally without rales, rhonchi, wheezing or stridor. Cardio:  RRR without murmurs, rubs or gallops. Brisk peripheral pulses without edema.  Chest: symmetric, with normal excursions and percussion. Breasts: Symmetric, without lumps, nipple discharge, retractions. Abdomen: Soft, +BS. Non tender, no guarding, rebound, hernias, masses, or organomegaly. .  Lymphatics: Non tender without lymphadenopathy.  Genitourinary: defer Musculoskeletal: Full ROM all peripheral extremities,5/5 strength, and normal gait. Skin: Warm, dry without rashes, lesions, ecchymosis.  Neuro: Cranial nerves intact, reflexes equal bilaterally. Normal muscle tone, no cerebellar symptoms. Sensation intact.  Psych: Awake and oriented X 3, normal affect, Insight and Judgment appropriate.   EKG: WNL no changes. AORTA SCAN: N/A   Vicie Mutters 2:17 PM

## 2014-12-08 ENCOUNTER — Other Ambulatory Visit: Payer: Self-pay

## 2014-12-08 LAB — URINALYSIS, ROUTINE W REFLEX MICROSCOPIC
BILIRUBIN URINE: NEGATIVE
Glucose, UA: NEGATIVE mg/dL
HGB URINE DIPSTICK: NEGATIVE
Ketones, ur: NEGATIVE mg/dL
Leukocytes, UA: NEGATIVE
Nitrite: POSITIVE — AB
PH: 6.5 (ref 5.0–8.0)
Protein, ur: NEGATIVE mg/dL
SPECIFIC GRAVITY, URINE: 1.017 (ref 1.005–1.030)
Urobilinogen, UA: 0.2 mg/dL (ref 0.0–1.0)

## 2014-12-08 LAB — MICROALBUMIN / CREATININE URINE RATIO
Creatinine, Urine: 121.5 mg/dL
Microalb Creat Ratio: 10.7 mg/g (ref 0.0–30.0)
Microalb, Ur: 1.3 mg/dL (ref <2.0)

## 2014-12-08 LAB — URINALYSIS, MICROSCOPIC ONLY
CASTS: NONE SEEN
SQUAMOUS EPITHELIAL / LPF: NONE SEEN

## 2014-12-08 LAB — VITAMIN D 25 HYDROXY (VIT D DEFICIENCY, FRACTURES): Vit D, 25-Hydroxy: 47 ng/mL (ref 30–100)

## 2014-12-08 MED ORDER — LEVONORGESTREL-ETHINYL ESTRAD 0.15-30 MG-MCG PO TABS: 1.0000 | ORAL_TABLET | Freq: Every day | ORAL | Status: DC

## 2014-12-08 MED ORDER — SULFAMETHOXAZOLE-TRIMETHOPRIM 800-160 MG PO TABS: 1.0000 | ORAL_TABLET | Freq: Two times a day (BID) | ORAL | Status: DC

## 2014-12-08 NOTE — Addendum Note (Signed)
Addended by: Vicie Mutters R on: 12/08/2014 08:39 AM   Modules accepted: Orders, SmartSet

## 2014-12-10 LAB — URINE CULTURE: Colony Count: >=100000

## 2015-01-08 ENCOUNTER — Other Ambulatory Visit: Payer: Self-pay

## 2015-01-09 ENCOUNTER — Other Ambulatory Visit: Payer: Self-pay | Admitting: Internal Medicine

## 2015-01-09 ENCOUNTER — Other Ambulatory Visit: Payer: Self-pay

## 2015-01-11 ENCOUNTER — Other Ambulatory Visit: Payer: 59

## 2015-01-11 DIAGNOSIS — R319 Hematuria, unspecified: Principal | ICD-10-CM

## 2015-01-11 DIAGNOSIS — N390 Urinary tract infection, site not specified: Secondary | ICD-10-CM

## 2015-01-12 LAB — URINALYSIS, MICROSCOPIC ONLY
CRYSTALS: NONE SEEN
Casts: NONE SEEN
Squamous Epithelial / LPF: NONE SEEN

## 2015-01-12 LAB — URINALYSIS, ROUTINE W REFLEX MICROSCOPIC
BILIRUBIN URINE: NEGATIVE
GLUCOSE, UA: NEGATIVE mg/dL
Hgb urine dipstick: NEGATIVE
KETONES UR: NEGATIVE mg/dL
Nitrite: POSITIVE — AB
PH: 6 (ref 5.0–8.0)
Protein, ur: NEGATIVE mg/dL
SPECIFIC GRAVITY, URINE: 1.03 (ref 1.005–1.030)
Urobilinogen, UA: 0.2 mg/dL (ref 0.0–1.0)

## 2015-01-14 LAB — URINE CULTURE

## 2015-01-15 ENCOUNTER — Other Ambulatory Visit: Payer: Self-pay

## 2015-01-15 MED ORDER — SULFAMETHOXAZOLE-TRIMETHOPRIM 800-160 MG PO TABS: 1.0000 | ORAL_TABLET | Freq: Two times a day (BID) | ORAL | Status: DC

## 2015-01-17 ENCOUNTER — Encounter: Payer: Self-pay | Admitting: Physician Assistant

## 2015-01-18 MED ORDER — BUPROPION HCL ER (XL) 150 MG PO TB24: 150.0000 mg | ORAL_TABLET | ORAL | Status: DC

## 2015-01-29 ENCOUNTER — Other Ambulatory Visit: Payer: Self-pay | Admitting: Physician Assistant

## 2015-01-29 NOTE — Telephone Encounter (Signed)
Called Rx into [facility] 

## 2015-01-30 ENCOUNTER — Encounter: Payer: Self-pay | Admitting: *Deleted

## 2015-02-27 ENCOUNTER — Other Ambulatory Visit: Payer: Self-pay | Admitting: Physician Assistant

## 2015-03-02 ENCOUNTER — Other Ambulatory Visit: Payer: 59

## 2015-03-02 ENCOUNTER — Other Ambulatory Visit: Payer: Self-pay

## 2015-03-02 DIAGNOSIS — R300 Dysuria: Secondary | ICD-10-CM

## 2015-03-03 LAB — URINALYSIS, ROUTINE W REFLEX MICROSCOPIC
BILIRUBIN URINE: NEGATIVE
GLUCOSE, UA: NEGATIVE
KETONES UR: NEGATIVE
Nitrite: POSITIVE — AB
Protein, ur: NEGATIVE
SPECIFIC GRAVITY, URINE: 1.019 (ref 1.001–1.035)
pH: 7 (ref 5.0–8.0)

## 2015-03-03 LAB — URINALYSIS, MICROSCOPIC ONLY
CASTS: NONE SEEN [LPF]
CRYSTALS: NONE SEEN [HPF]
SQUAMOUS EPITHELIAL / LPF: NONE SEEN [HPF] (ref <=5)
YEAST: NONE SEEN [HPF]

## 2015-03-05 LAB — URINE CULTURE: Colony Count: >=100000

## 2015-03-07 MED ORDER — NITROFURANTOIN MONOHYD MACRO 100 MG PO CAPS: 100.0000 mg | ORAL_CAPSULE | Freq: Two times a day (BID) | ORAL | Status: DC

## 2015-03-29 ENCOUNTER — Other Ambulatory Visit: Payer: Self-pay | Admitting: Physician Assistant

## 2015-04-03 ENCOUNTER — Other Ambulatory Visit: Payer: 59

## 2015-04-03 DIAGNOSIS — R350 Frequency of micturition: Secondary | ICD-10-CM

## 2015-04-04 LAB — URINALYSIS, ROUTINE W REFLEX MICROSCOPIC
BILIRUBIN URINE: NEGATIVE
Glucose, UA: NEGATIVE
Hgb urine dipstick: NEGATIVE
Ketones, ur: NEGATIVE
NITRITE: NEGATIVE
PROTEIN: NEGATIVE
SPECIFIC GRAVITY, URINE: 1.022 (ref 1.001–1.035)
pH: 6.5 (ref 5.0–8.0)

## 2015-04-04 LAB — URINALYSIS, MICROSCOPIC ONLY
CASTS: NONE SEEN [LPF]
Crystals: NONE SEEN [HPF]
RBC / HPF: NONE SEEN RBC/HPF (ref <=2)
Yeast: NONE SEEN [HPF]

## 2015-04-05 LAB — URINE CULTURE

## 2015-04-06 MED ORDER — NITROFURANTOIN MONOHYD MACRO 100 MG PO CAPS: 100.0000 mg | ORAL_CAPSULE | Freq: Two times a day (BID) | ORAL | Status: DC

## 2015-04-06 NOTE — Addendum Note (Signed)
Addended by: Vicie Mutters R on: 04/06/2015 07:08 AM   Modules accepted: Orders

## 2015-04-25 ENCOUNTER — Other Ambulatory Visit: Payer: Self-pay | Admitting: Physician Assistant

## 2015-04-25 NOTE — Telephone Encounter (Signed)
Rx sent into [facility] [address]

## 2015-04-29 ENCOUNTER — Other Ambulatory Visit: Payer: Self-pay | Admitting: Physician Assistant

## 2015-05-04 ENCOUNTER — Other Ambulatory Visit: Payer: 59

## 2015-05-04 ENCOUNTER — Other Ambulatory Visit: Payer: Self-pay

## 2015-05-04 DIAGNOSIS — R350 Frequency of micturition: Secondary | ICD-10-CM

## 2015-05-05 LAB — URINALYSIS, ROUTINE W REFLEX MICROSCOPIC
Bilirubin Urine: NEGATIVE
GLUCOSE, UA: NEGATIVE
HGB URINE DIPSTICK: NEGATIVE
KETONES UR: NEGATIVE
Leukocytes, UA: NEGATIVE
NITRITE: NEGATIVE
PH: 7 (ref 5.0–8.0)
PROTEIN: NEGATIVE
Specific Gravity, Urine: 1.012 (ref 1.001–1.035)

## 2015-05-06 LAB — URINE CULTURE: Colony Count: >=100000

## 2015-05-07 MED ORDER — NITROFURANTOIN MONOHYD MACRO 100 MG PO CAPS: 100.0000 mg | ORAL_CAPSULE | Freq: Two times a day (BID) | ORAL | Status: DC

## 2015-06-06 ENCOUNTER — Ambulatory Visit (INDEPENDENT_AMBULATORY_CARE_PROVIDER_SITE_OTHER): Payer: 59 | Admitting: Physician Assistant

## 2015-06-06 ENCOUNTER — Encounter: Payer: Self-pay | Admitting: Physician Assistant

## 2015-06-06 VITALS — BP 130/100 | HR 88 | Temp 97.3°F | Resp 16 | Ht 63.5 in | Wt 172.0 lb

## 2015-06-06 DIAGNOSIS — E876 Hypokalemia: Secondary | ICD-10-CM | POA: Diagnosis not present

## 2015-06-06 DIAGNOSIS — D649 Anemia, unspecified: Secondary | ICD-10-CM

## 2015-06-06 DIAGNOSIS — E559 Vitamin D deficiency, unspecified: Secondary | ICD-10-CM | POA: Diagnosis not present

## 2015-06-06 DIAGNOSIS — F419 Anxiety disorder, unspecified: Secondary | ICD-10-CM | POA: Diagnosis not present

## 2015-06-06 DIAGNOSIS — I10 Essential (primary) hypertension: Secondary | ICD-10-CM | POA: Diagnosis not present

## 2015-06-06 DIAGNOSIS — E785 Hyperlipidemia, unspecified: Secondary | ICD-10-CM

## 2015-06-06 DIAGNOSIS — N390 Urinary tract infection, site not specified: Secondary | ICD-10-CM

## 2015-06-06 DIAGNOSIS — Z79899 Other long term (current) drug therapy: Secondary | ICD-10-CM

## 2015-06-06 MED ORDER — ATENOLOL 100 MG PO TABS: ORAL_TABLET | ORAL | Status: DC

## 2015-06-06 NOTE — Addendum Note (Signed)
Addended by: Vicie Mutters R on: 06/06/2015 05:30 PM   Modules accepted: Orders

## 2015-06-06 NOTE — Patient Instructions (Signed)
Asymptomatic Bacteriuria, Female Asymptomatic bacteriuria is the presence of a large number of bacteria in your urine without the usual symptoms of burning or frequent urination. The following conditions increase the risk of asymptomatic bacteriuria:  Diabetes mellitus.  Advanced age.  Pregnancy in the first trimester.  Kidney stones.  Kidney transplants.  Leaky kidney tube valve in young children (reflux). Treatment for this condition is not needed in most people and can lead to other problems such as too much yeast and growth of resistant bacteria. However, some people, such as pregnant women, do need treatment to prevent kidney infection. Asymptomatic bacteriuria in pregnancy is also associated with fetal growth restriction, premature labor, and newborn death. HOME CARE INSTRUCTIONS Monitor your condition for any changes. The following actions may help to relieve any discomfort you are feeling:  Drink enough water and fluids to keep your urine clear or pale yellow. Go to the bathroom more often to keep your bladder empty.  Keep the area around your vagina and rectum clean. Wipe yourself from front to back after urinating. SEEK IMMEDIATE MEDICAL CARE IF:  You develop signs of an infection such as:  Burning with urination.  Frequency of voiding.  Back pain.  Fever.  You have blood in the urine.  You develop a fever. MAKE SURE YOU:  Understand these instructions.  Will watch your condition.  Will get help right away if you are not doing well or get worse.   This information is not intended to replace advice given to you by your health care provider. Make sure you discuss any questions you have with your health care provider.   Document Released: 06/16/2005 Document Revised: 07/07/2014 Document Reviewed: 12/06/2012 Elsevier Interactive Patient Education 2016 [employer]  

## 2015-06-06 NOTE — Progress Notes (Signed)
Assessment and Plan:  1. Hypertension -increase atenolol to 100mg , monitor blood pressure at home. Continue DASH diet.  Reminder to go to the ER if any CP, SOB, nausea, dizziness, severe HA, changes vision/speech, left arm numbness and tingling and jaw pain.  2. Cholesterol -Continue diet and exercise. Check cholesterol.   3. Prediabetes  -Continue diet and exercise. Check A1C  4. Vitamin D Def - check level and continue medications.   5. Recheck urine ? Colonization versus post coital UTI  6. Back pain Negative straight leg, likely from piriformis, will do stretches.    Continue diet and meds as discussed. Further disposition pending results of labs. Over 30 minutes of exam, counseling, chart review, and critical decision making was performed  HPI 54 y.o. female  presents for 3 month follow up on hypertension, cholesterol, prediabetes, and vitamin D deficiency.   Her blood pressure has been controlled at home, she is on atenolol at night 50mg , today their BP is BP: (!) 130/100 mmHg  She does workout. She denies chest pain, shortness of breath, dizziness.  She is not on cholesterol medication and denies myalgias. Her cholesterol is at goal. The cholesterol last visit was:   Lab Results  Component Value Date   CHOL 164 12/07/2014   HDL 57 12/07/2014   LDLCALC 81 12/07/2014   TRIG 131 12/07/2014   CHOLHDL 2.9 12/07/2014   Last A1C in the office was:  Lab Results  Component Value Date   HGBA1C 5.3 11/29/2013   Patient is on Vitamin D supplement.   Lab Results  Component Value Date   VD25OH 47 12/07/2014     Her dad is in the hospital and being transferred to NH, and her mother is in assisted living and may be going to the hospital today. She is on wellbutrin for stress, states it is helping.  She has had recurrent ecoli UTI. She has lower back and left leg pain. No symptoms.   Current Medications:  Current Outpatient Prescriptions on File Prior to Visit  Medication Sig  Dispense Refill  . albuterol (PROVENTIL HFA;VENTOLIN HFA) 108 (90 BASE) MCG/ACT inhaler Inhale 1-2 puffs into the lungs every 6 (six) hours as needed for wheezing or shortness of breath. 18 g 2  . ALPRAZolam (XANAX) 0.5 MG tablet Take 1 tablet (0.5 mg total) by mouth 3 (three) times daily as needed for anxiety. 90 tablet 5  . Ascorbic Acid (VITAMIN C PO) Take by mouth daily.    Marland Kitchen atenolol (TENORMIN) 50 MG tablet TAKE 1 BY MOUTH DAILY 90 tablet 3  . buPROPion (WELLBUTRIN XL) 150 MG 24 hr tablet Take 1 tablet (150 mg total) by mouth every morning. 90 tablet 1  . hydrochlorothiazide (HYDRODIURIL) 25 MG tablet Take 1 tablet (25 mg total) by mouth daily. 90 tablet 3  . levonorgestrel-ethinyl estradiol (NORDETTE) 0.15-30 MG-MCG tablet Take 1 tablet by mouth daily. 28 tablet 0  . MAGNESIUM PO Take 400 mg by mouth daily.     . montelukast (SINGULAIR) 10 MG tablet Take 1 tablet (10 mg total) by mouth at bedtime. 90 tablet 4  . nitrofurantoin, macrocrystal-monohydrate, (MACROBID) 100 MG capsule Take 1 capsule (100 mg total) by mouth 2 (two) times daily. 20 capsule 0  . omeprazole (PRILOSEC) 20 MG capsule Take 20 mg by mouth 2 (two) times daily before a meal.    . traMADol (ULTRAM) 50 MG tablet TAKE 1 TABLET BY MOUTH EVERY 6 HOURS AS NEEDED 90 tablet 0  . valACYclovir (VALTREX)  500 MG tablet Take 500 mg by mouth daily as needed.    . valsartan (DIOVAN) 320 MG tablet Take 1 tablet by mouth  daily 90 tablet 3   No current facility-administered medications on file prior to visit.   Medical History:  Past Medical History  Diagnosis Date  . Hypertension   . Allergy   . Anemia   . Anxiety   . Asthma    Allergies:  Allergies  Allergen Reactions  . Ciprofloxacin Nausea Only    Headache  . Penicillins     Review of Systems:  Review of Systems  Constitutional: Negative.   HENT: Negative.   Eyes: Negative.   Respiratory: Negative.   Cardiovascular: Negative.   Gastrointestinal: Negative.    Genitourinary: Negative.   Musculoskeletal: Positive for back pain. Negative for myalgias, joint pain, falls and neck pain.  Skin: Negative.   Neurological: Negative.   Endo/Heme/Allergies: Negative.   Psychiatric/Behavioral: Negative for depression, suicidal ideas, hallucinations, memory loss and substance abuse. The patient is nervous/anxious. The patient does not have insomnia.     Family history- Review and unchanged Social history- Review and unchanged Physical Exam: BP 130/100 mmHg  Pulse 88  Temp(Src) 97.3 F (36.3 C) (Temporal)  Resp 16  Ht 5' 3.5" (1.613 m)  Wt 172 lb (78.019 kg)  BMI 29.99 kg/m2  SpO2 99%  LMP 05/23/2015 Wt Readings from Last 3 Encounters:  06/06/15 172 lb (78.019 kg)  12/07/14 172 lb (78.019 kg)  01/31/14 163 lb 3.2 oz (74.027 kg)   General Appearance: Well nourished, in no apparent distress. Eyes: PERRLA, EOMs, conjunctiva no swelling or erythema Sinuses: No Frontal/maxillary tenderness ENT/Mouth: Ext aud canals clear, TMs without erythema, bulging. No erythema, swelling, or exudate on post pharynx.  Tonsils not swollen or erythematous. Hearing normal.  Neck: Supple, thyroid normal.  Respiratory: Respiratory effort normal, BS equal bilaterally without rales, rhonchi, wheezing or stridor.  Cardio: RRR with no MRGs. Brisk peripheral pulses without edema.  Abdomen: Soft, + BS,  Non tender, no guarding, rebound, hernias, masses. Lymphatics: Non tender without lymphadenopathy.  Musculoskeletal: Full ROM, 5/5 strength, Normal gait, negative straight leg raise, + tight piriformis Skin: Warm, dry without rashes, lesions, ecchymosis.  Neuro: Cranial nerves intact. Normal muscle tone, no cerebellar symptoms. Psych: Awake and oriented X 3, normal affect, Insight and Judgment appropriate.    Vicie Mutters, PA-C 4:56 PM [facility]

## 2015-06-07 LAB — URINALYSIS, ROUTINE W REFLEX MICROSCOPIC
Bilirubin Urine: NEGATIVE
Glucose, UA: NEGATIVE
LEUKOCYTES UA: NEGATIVE
NITRITE: NEGATIVE
PH: 6 (ref 5.0–8.0)
Protein, ur: NEGATIVE
SPECIFIC GRAVITY, URINE: 1.03 (ref 1.001–1.035)

## 2015-06-07 LAB — URINALYSIS, MICROSCOPIC ONLY
Bacteria, UA: NONE SEEN [HPF]
Casts: NONE SEEN [LPF]
Crystals: NONE SEEN [HPF]
Yeast: NONE SEEN [HPF]

## 2015-06-07 LAB — URINE CULTURE: Colony Count: 25000

## 2015-06-18 ENCOUNTER — Encounter: Payer: Self-pay | Admitting: Internal Medicine

## 2015-06-18 ENCOUNTER — Ambulatory Visit (INDEPENDENT_AMBULATORY_CARE_PROVIDER_SITE_OTHER): Payer: 59 | Admitting: Internal Medicine

## 2015-06-18 VITALS — BP 122/84 | HR 76 | Temp 97.3°F | Resp 16 | Ht 63.5 in | Wt 169.0 lb

## 2015-06-18 DIAGNOSIS — L240 Irritant contact dermatitis due to detergents: Secondary | ICD-10-CM | POA: Diagnosis not present

## 2015-06-18 MED ORDER — PREDNISONE 20 MG PO TABS: ORAL_TABLET | ORAL | Status: DC

## 2015-06-18 NOTE — Progress Notes (Signed)
  Subjective:    Patient ID: Valerie Hobbs, female    DOB: [birth year], 54 y.o.   MRN: [id number]  HPI  Patient present with a dry pale pink rash of her trunk & extremities. No recent new meds or exposures, but is using a new TIDE laundry detergent with some type of additive.   Medication Sig  . albuterol (PROVENTIL HFA;VENTOLIN HFA) 108 (90 BASE) MCG/ACT inhaler Inhale 1-2 puffs into the lungs every 6 (six) hours as needed for wheezing or shortness of breath.  . ALPRAZolam (XANAX) 0.5 MG tablet Take 1 tablet (0.5 mg total) by mouth 3 (three) times daily as needed for anxiety.  . Ascorbic Acid (VITAMIN C PO) Take by mouth daily.  Marland Kitchen atenolol (TENORMIN) 100 MG tablet TAKE 1 BY MOUTH DAILY  . buPROPion (WELLBUTRIN XL) 150 MG 24 hr tablet Take 1 tablet (150 mg total) by mouth every morning.  . hydrochlorothiazide (HYDRODIURIL) 25 MG tablet Take 1 tablet (25 mg total) by mouth daily.  Marland Kitchen levonorgestrel-ethinyl estradiol (NORDETTE) 0.15-30 MG-MCG tablet Take 1 tablet by mouth daily.  Marland Kitchen MAGNESIUM PO Take 400 mg by mouth daily.   . montelukast (SINGULAIR) 10 MG tablet Take 1 tablet (10 mg total) by mouth at bedtime.  . nitrofurantoin, macrocrystal-monohydrate, (MACROBID) 100 MG capsule Take 1 capsule (100 mg total) by mouth 2 (two) times daily.  Marland Kitchen omeprazole (PRILOSEC) 20 MG capsule Take 20 mg by mouth 2 (two) times daily before a meal.  . traMADol (ULTRAM) 50 MG tablet TAKE 1 TABLET BY MOUTH EVERY 6 HOURS AS NEEDED  . valACYclovir (VALTREX) 500 MG tablet Take 500 mg by mouth daily as needed.  . valsartan (DIOVAN) 320 MG tablet Take 1 tablet by mouth  daily   Allergies  Allergen Reactions  . Ciprofloxacin Nausea Only    Headache  . Penicillins    Past Medical History  Diagnosis Date  . Hypertension   . Allergy   . Anemia   . Anxiety   . Asthma    No past surgical history on file.  Review of Systems  10 point systems review negative except as above.    Objective:   Physical  Exam  BP 122/84 mmHg  Pulse 76  Temp(Src) 97.3 F (36.3 C)  Resp 16  Ht 5' 3.5" (1.613 m)  Wt 169 lb (76.658 kg)  BMI 29.46 kg/m2  LMP 05/23/2015  Exposed skin of extremities and of trunk reveals a dry pinkish rash consistent with a contact dermatitis. No hives, vesicles or ulcerations or suspect for cellulitis or erisepelas    Assessment & Plan:   1. Contact dermatitis and eczema due to detergents  - predniSONE (DELTASONE) 20 MG tablet; 1 tab 3 x day for 3 days, then 1 tab 2 x day for 3 days, then 1 tab 1 x day for 5 days  Dispense: 20 tablet; Refill: 0  -= Advised switch detergents to [employer] or [employer] and double rinse til rash cleared

## 2015-07-03 ENCOUNTER — Other Ambulatory Visit: Payer: Self-pay | Admitting: *Deleted

## 2015-07-03 ENCOUNTER — Other Ambulatory Visit: Payer: Self-pay | Admitting: Internal Medicine

## 2015-07-03 ENCOUNTER — Other Ambulatory Visit: Payer: Self-pay | Admitting: Physician Assistant

## 2015-07-03 DIAGNOSIS — F341 Dysthymic disorder: Secondary | ICD-10-CM

## 2015-07-03 MED ORDER — ALPRAZOLAM 0.5 MG PO TABS: 0.5000 mg | ORAL_TABLET | Freq: Three times a day (TID) | ORAL | Status: DC | PRN

## 2015-07-04 NOTE — Telephone Encounter (Signed)
Rx was called into [facility].

## 2015-07-19 ENCOUNTER — Ambulatory Visit (INDEPENDENT_AMBULATORY_CARE_PROVIDER_SITE_OTHER): Payer: 59 | Admitting: Physician Assistant

## 2015-07-19 ENCOUNTER — Encounter: Payer: Self-pay | Admitting: Physician Assistant

## 2015-07-19 VITALS — BP 130/80 | HR 88 | Temp 97.2°F | Resp 16 | Ht 63.5 in | Wt 173.0 lb

## 2015-07-19 DIAGNOSIS — M10072 Idiopathic gout, left ankle and foot: Secondary | ICD-10-CM | POA: Diagnosis not present

## 2015-07-19 DIAGNOSIS — M109 Gout, unspecified: Secondary | ICD-10-CM

## 2015-07-19 LAB — CBC WITH DIFFERENTIAL/PLATELET
BASOS ABS: 0 10*3/uL (ref 0.0–0.1)
Basophils Relative: 0 % (ref 0–1)
EOS ABS: 0.1 10*3/uL (ref 0.0–0.7)
Eosinophils Relative: 1 % (ref 0–5)
HEMATOCRIT: 37.9 % (ref 36.0–46.0)
Hemoglobin: 12.4 g/dL (ref 12.0–15.0)
Lymphocytes Relative: 30 % (ref 12–46)
Lymphs Abs: 4.3 10*3/uL — ABNORMAL HIGH (ref 0.7–4.0)
MCH: 32.5 pg (ref 26.0–34.0)
MCHC: 32.7 g/dL (ref 30.0–36.0)
MCV: 99.5 fL (ref 78.0–100.0)
MPV: 10.4 fL (ref 8.6–12.4)
Monocytes Absolute: 1.3 10*3/uL — ABNORMAL HIGH (ref 0.1–1.0)
Monocytes Relative: 9 % (ref 3–12)
NEUTROS ABS: 8.6 10*3/uL — AB (ref 1.7–7.7)
NEUTROS PCT: 60 % (ref 43–77)
PLATELETS: 320 10*3/uL (ref 150–400)
RBC: 3.81 MIL/uL — AB (ref 3.87–5.11)
RDW: 13.9 % (ref 11.5–15.5)
WBC: 14.3 10*3/uL — AB (ref 4.0–10.5)

## 2015-07-19 MED ORDER — PREDNISONE 20 MG PO TABS: ORAL_TABLET | ORAL | Status: DC

## 2015-07-19 MED ORDER — COLCHICINE 0.6 MG PO TABS: 0.6000 mg | ORAL_TABLET | Freq: Every day | ORAL | Status: DC

## 2015-07-19 NOTE — Progress Notes (Signed)
Subjective:    Patient ID: Valerie Hobbs, female    DOB: [birth year], 55 y.o.   MRN: [id number]  HPI 55 y.o. WF with left great toe pain x 1 week. Pain with any touch, some mild warmth. Denies fever, chills. Drinks wine, does not drink beer, had a 1lb for crab legs Monday. Mom has gout too. She is on HCTZ.   Blood pressure 130/80, pulse 88, temperature 97.2 F (36.2 C), temperature source Temporal, resp. rate 16, height 5' 3.5" (1.613 m), weight 173 lb (78.472 kg), last menstrual period 07/15/2015, SpO2 97 %.  Current Outpatient Prescriptions on File Prior to Visit  Medication Sig Dispense Refill  . albuterol (PROVENTIL HFA;VENTOLIN HFA) 108 (90 BASE) MCG/ACT inhaler Inhale 1-2 puffs into the lungs every 6 (six) hours as needed for wheezing or shortness of breath. 18 g 2  . ALPRAZolam (XANAX) 0.5 MG tablet Take 1 tablet (0.5 mg total) by mouth 3 (three) times daily as needed for anxiety. 90 tablet 1  . Ascorbic Acid (VITAMIN C PO) Take by mouth daily.    Marland Kitchen atenolol (TENORMIN) 100 MG tablet TAKE 1 BY MOUTH DAILY 90 tablet 2  . buPROPion (WELLBUTRIN XL) 150 MG 24 hr tablet Take 1 tablet by mouth  every morning 90 tablet 1  . hydrochlorothiazide (HYDRODIURIL) 25 MG tablet Take 1 tablet (25 mg total) by mouth daily. 90 tablet 3  . levonorgestrel-ethinyl estradiol (NORDETTE) 0.15-30 MG-MCG tablet Take 1 tablet by mouth daily. 28 tablet 0  . MAGNESIUM PO Take 400 mg by mouth daily.     . montelukast (SINGULAIR) 10 MG tablet Take 1 tablet (10 mg total) by mouth at bedtime. 90 tablet 4  . nitrofurantoin, macrocrystal-monohydrate, (MACROBID) 100 MG capsule Take 1 capsule (100 mg total) by mouth 2 (two) times daily. 20 capsule 0  . omeprazole (PRILOSEC) 20 MG capsule Take 20 mg by mouth 2 (two) times daily before a meal.    . predniSONE (DELTASONE) 20 MG tablet 1 tab 3 x day for 3 days, then 1 tab 2 x day for 3 days, then 1 tab 1 x day for 5 days 20 tablet 0  . traMADol (ULTRAM) 50 MG tablet  TAKE 1 TABLET BY MOUTH EVERY 6 HOURS AS NEEDED 90 tablet 0  . valACYclovir (VALTREX) 500 MG tablet Take 500 mg by mouth daily as needed.    . valsartan (DIOVAN) 320 MG tablet Take 1 tablet by mouth  daily 90 tablet 3   No current facility-administered medications on file prior to visit.   Current Outpatient Prescriptions on File Prior to Visit  Medication Sig Dispense Refill  . albuterol (PROVENTIL HFA;VENTOLIN HFA) 108 (90 BASE) MCG/ACT inhaler Inhale 1-2 puffs into the lungs every 6 (six) hours as needed for wheezing or shortness of breath. 18 g 2  . ALPRAZolam (XANAX) 0.5 MG tablet Take 1 tablet (0.5 mg total) by mouth 3 (three) times daily as needed for anxiety. 90 tablet 1  . Ascorbic Acid (VITAMIN C PO) Take by mouth daily.    Marland Kitchen atenolol (TENORMIN) 100 MG tablet TAKE 1 BY MOUTH DAILY 90 tablet 2  . buPROPion (WELLBUTRIN XL) 150 MG 24 hr tablet Take 1 tablet by mouth  every morning 90 tablet 1  . hydrochlorothiazide (HYDRODIURIL) 25 MG tablet Take 1 tablet (25 mg total) by mouth daily. 90 tablet 3  . levonorgestrel-ethinyl estradiol (NORDETTE) 0.15-30 MG-MCG tablet Take 1 tablet by mouth daily. 28 tablet 0  . MAGNESIUM PO Take 400  mg by mouth daily.     . montelukast (SINGULAIR) 10 MG tablet Take 1 tablet (10 mg total) by mouth at bedtime. 90 tablet 4  . nitrofurantoin, macrocrystal-monohydrate, (MACROBID) 100 MG capsule Take 1 capsule (100 mg total) by mouth 2 (two) times daily. 20 capsule 0  . omeprazole (PRILOSEC) 20 MG capsule Take 20 mg by mouth 2 (two) times daily before a meal.    . predniSONE (DELTASONE) 20 MG tablet 1 tab 3 x day for 3 days, then 1 tab 2 x day for 3 days, then 1 tab 1 x day for 5 days 20 tablet 0  . traMADol (ULTRAM) 50 MG tablet TAKE 1 TABLET BY MOUTH EVERY 6 HOURS AS NEEDED 90 tablet 0  . valACYclovir (VALTREX) 500 MG tablet Take 500 mg by mouth daily as needed.    . valsartan (DIOVAN) 320 MG tablet Take 1 tablet by mouth  daily 90 tablet 3   No current  facility-administered medications on file prior to visit.   Review of Systems  Constitutional: Negative.   HENT: Negative.   Respiratory: Negative.   Cardiovascular: Negative.   Gastrointestinal: Negative.   Genitourinary: Negative.   Musculoskeletal: Positive for joint swelling and gait problem. Negative for myalgias, back pain, arthralgias, neck pain and neck stiffness.  Neurological: Negative.        Objective:   Physical Exam  Constitutional: She is oriented to person, place, and time. She appears well-developed and well-nourished.  Cardiovascular: Normal rate and regular rhythm.   Pulmonary/Chest: Effort normal and breath sounds normal.  Abdominal: Soft. Bowel sounds are normal. There is no tenderness.  Musculoskeletal:  Left MTP with warmth, tenderness, erythema, no streaking, no edema, and normal distal neurovascular exam.   Neurological: She is alert and oriented to person, place, and time. No cranial nerve deficit.  Skin: Skin is warm and dry. No rash noted.       Assessment & Plan:  Gout/psudogout flare- check Uric acid, CBC, BMP, prednisone taper, and colchicine given. Diet discussed, decrease/stop ETOH, will follow up in 2-4 weeks for start of allopurinol, may switch HCTZ to lasix

## 2015-07-19 NOTE — Patient Instructions (Signed)
You can start on allopurinol 300mg, start on 1/2 pill a day This can sometime CAUSE a gout flare so I'm send in colchcine for you to take twice a day for 1 week  And I'm send in  Prednisone for you to take IF you get pain   Information for patients with Gout  Gout defined-Gout occurs when urate crystals accumulate in your joint causing the inflammation and intense pain of gout attack.  Urate crystals can form when you have high levels of uric acid in your blood.  Your body produces uric acid when it breaks down prurines-substances that are found naturally in your body, as well as in certain foods such as organ meats, anchioves, herring, asparagus, and mushrooms.  Normally uric acid dissolves in your blood and passes through your kidneys into your urine.  But sometimes your body either produces too much uric acid or your kidneys excrete too little uric acid.  When this happens, uric acid can build up, forming sharp needle-like urate crystals in a joint or surrounding tissue that cause pain, inflammation and swelling.    Gout is characterized by sudden, severe attacks of pain, redness and tenderness in joints, often the joint at the base of the big toe.  Gout is complex form of arthritis that can affect anyone.  Men are more likely to get gout but women become increasingly more susceptible to gout after menopause.  An acute attack of gout can wake you up in the middle of the night with the sensation that your big toe is on fire.  The affected joint is hot, swollen and so tender that even the weight or the sheet on it may seem intolerable.  If you experience symptoms of an acute gout attack it is important to your doctor as soon as the symptoms start.  Gout that goes untreated can lead to worsening pain and joint damage.  Risk Factors:  You are more likely to develop gout if you have high levels of uric acid in your body.    Factors that increase the uric acid level in your body  include:  Lifestyle factors.  Excessive alcohol use-generally more than two drinks a day for men and more than one for women increase the risk of gout.  Medical conditions.  Certain conditions make it more likely that you will develop gout.  These include hypertension, and chronic conditions such as diabetes, high levels of fat and cholesterol in the blood, and narrowing of the arteries.  Certain medications.  The uses of Thiazide diuretics- commonly used to treat hypertension and low dose aspirin can also increase uric acid levels.  Family history of gout.  If other members of your family have had gout, you are more likely to develop the disease.  Age and sex. Gout occurs more often in men than it does in women, primarily because women tend to have lower uric acid levels than men do.  Men are more likely to develop gout earlier usually between the ages of 40-50- whereas women generally develop signs and symptoms after menopause.    Tests and diagnosis:  Tests to help diagnose gout may include:  Blood test.  Your doctor may recommend a blood test to measure the uric acid level in your blood .  Blood tests can be misleading, though.  Some people have high uric acid levels but never experience gout.  And some people have signs and symptoms of gout, but don't have unusual levels of uric acid in their   blood.  Joint fluid test.  Your doctor may use a needle to draw fluid from your affected joint.  When examined under the microscope, your joint fluid may reveal urate crystals.  Treatment:  Treatment for gout usually involves medications.  What medications you and your doctor choose will be based on your current health and other medications you currently take.  Gout medications can be used to treat acute gout attacks and prevent future attacks as well as reduce your risk of complications from gout such as the development of tophi from urate crystal deposits.  Alternative medicine:   Certain  foods have been studied for their potential to lower uric acid levels, including:  Coffee.  Studies have found an association between coffee drinking (regular and decaf) and lower uric acid levels.  The evidence is not enough to encourage non-coffee drinkers to start, but it may give clues to new ways of treating gout in the future.  Vitamin C.  Supplements containing vitamin C may reduce the levels of uric acid in your blood.  However, vitamin as a treatment for gout. Don't assume that if a little vitamin C is good, than lots is better.  Megadoses of vitamin C may increase your bodies uric acid levels.  Cherries.  Cherries have been associated with lower levels of uric acid in studies, but it isn't clear if they have any effect on gout signs and symptoms.  Eating more cherries and other dar-colored fruits, such as blackberries, blueberries, purple grapes and raspberries, may be a safe way to support your gout treatment.    Lifestyle/Diet Recommendations:   Drink 8 to 16 cups ( about 2 to 4 liters) of fluid each day, with at least half being water.  Avoid alcohol  Eat a moderate amount of protein, preferably from healthy sources, such as low-fat or fat-free dairy, tofu, eggs, and nut butters.  Limit you daily intake of meat, fish, and poultry to 4 to 6 ounces.  Avoid high fat meats and desserts.  Decrease you intake of shellfish, beef, lamb, pork, eggs and cheese.  Choose a good source of vitamin C daily such as citrus fruits, strawberries, broccoli,  brussel sprouts, papaya, and cantaloupe.   Choose a good source of vitamin A every other day such as yellow fruits, or dark green/yellow vegetables.  Avoid drastic weigh reduction or fasting.  If weigh loss is desired lose it over a period of several months.  See "dietary considerations.." chart for specific food recommendations.  Dietary Considerations for people with Gout  Food with negligible purine content (0-15 mg of purine  nitrogen per 100 grams food)  May use as desired except on calorie variations  Non fat milk Cocoa Cereals (except in list II) Hard candies  Buttermilk Carbonated drinks Vegetables (except in list II) Sherbet  Coffee Fruits Sugar Honey  Tea Cottage Cheese Gelatin-jell-o Salt  Fruit juice Breads Angel food Cake   Herbs/spices Jams/Jellies Carnation Instant Breakfast    Foods that do not contain excessive purine content, but must be limited due to fat content  Cream Eggs Oil and Salad Dressing  Half and Half Peanut Butter Chocolate  Whole Milk Cakes Potato Chips  Butter Ice Cream Fried Foods  Cheese Nuts Waffles, pancakes   List II: Food with moderate purine content (50-150 mg of purine nitrogen per 100 grams of food)  Limit total amount each day to 5 oz. cooked Lean meat, other than those on list III   Poultry, other than those on   list III Fish, other than those on list III   Seafood, other than those on list III  These foods may be used occasionally  Peas Lentils Bran  Spinach Oatmeal Dried Beans and Peas  Asparagus Wheat Germ Mushrooms   Additional information about meat choices  Choose fish and poultry, particularly without skin, often.  Select lean, well trimmed cuts of meat.  Avoid all fatty meats, bacon , sausage, fried meats, fried fish, or poultry, luncheon meats, cold cuts, hot dogs, meats canned or frozen in gravy, spareribs and frozen and packaged prepared meats.   List III: Foods with HIGH purine content / Foods to AVOID (150-800 mg of purine nitrogen per 100 grams of food)  Anchovies Herring Meat Broths  Liver Mackerel Meat Extracts  Kidney Scallops Meat Drippings  Sardines Wild Game Mincemeat  Sweetbreads Goose Gravy  Heart Tongue Yeast, baker's and brewers   Commercial soups made with any of the foods listed in List II or List III  In addition avoid all alcoholic beverages  

## 2015-07-20 ENCOUNTER — Other Ambulatory Visit: Payer: Self-pay | Admitting: Physician Assistant

## 2015-07-20 LAB — HEPATIC FUNCTION PANEL
ALK PHOS: 53 U/L (ref 33–130)
ALT: 33 U/L — ABNORMAL HIGH (ref 6–29)
AST: 22 U/L (ref 10–35)
Albumin: 4 g/dL (ref 3.6–5.1)
BILIRUBIN DIRECT: 0.1 mg/dL (ref <=0.2)
BILIRUBIN INDIRECT: 0.2 mg/dL (ref 0.2–1.2)
TOTAL PROTEIN: 6.3 g/dL (ref 6.1–8.1)
Total Bilirubin: 0.3 mg/dL (ref 0.2–1.2)

## 2015-07-20 LAB — BASIC METABOLIC PANEL WITH GFR
BUN: 11 mg/dL (ref 7–25)
CALCIUM: 9.2 mg/dL (ref 8.6–10.4)
CO2: 26 mmol/L (ref 20–31)
Chloride: 101 mmol/L (ref 98–110)
Creat: 0.88 mg/dL (ref 0.50–1.05)
GFR, EST AFRICAN AMERICAN: 86 mL/min (ref >=60)
GFR, EST NON AFRICAN AMERICAN: 74 mL/min (ref >=60)
GLUCOSE: 73 mg/dL (ref 65–99)
POTASSIUM: 3.6 mmol/L (ref 3.5–5.3)
SODIUM: 138 mmol/L (ref 135–146)

## 2015-07-20 LAB — URIC ACID: Uric Acid, Serum: 6.3 mg/dL (ref 2.4–7.0)

## 2015-07-20 MED ORDER — FUROSEMIDE 20 MG PO TABS: 20.0000 mg | ORAL_TABLET | Freq: Every day | ORAL | Status: DC

## 2015-07-20 MED ORDER — SULFAMETHOXAZOLE-TRIMETHOPRIM 800-160 MG PO TABS: 1.0000 | ORAL_TABLET | Freq: Two times a day (BID) | ORAL | Status: DC

## 2015-07-20 MED ORDER — FLUCONAZOLE 150 MG PO TABS: 150.0000 mg | ORAL_TABLET | Freq: Every day | ORAL | Status: DC

## 2015-08-16 ENCOUNTER — Other Ambulatory Visit: Payer: Self-pay | Admitting: *Deleted

## 2015-08-16 ENCOUNTER — Other Ambulatory Visit: Payer: Self-pay | Admitting: Physician Assistant

## 2015-08-16 MED ORDER — FUROSEMIDE 20 MG PO TABS: 20.0000 mg | ORAL_TABLET | Freq: Every day | ORAL | Status: DC

## 2015-08-22 ENCOUNTER — Other Ambulatory Visit: Payer: Self-pay | Admitting: Physician Assistant

## 2015-08-22 NOTE — Telephone Encounter (Signed)
Rx called into [facility]. 

## 2015-08-23 ENCOUNTER — Other Ambulatory Visit: Payer: Self-pay | Admitting: Physician Assistant

## 2015-08-23 ENCOUNTER — Encounter: Payer: Self-pay | Admitting: Physician Assistant

## 2015-08-23 DIAGNOSIS — M109 Gout, unspecified: Secondary | ICD-10-CM

## 2015-08-23 MED ORDER — COLCHICINE 0.6 MG PO TABS: 0.6000 mg | ORAL_TABLET | Freq: Every day | ORAL | Status: DC

## 2015-08-23 MED ORDER — PREDNISONE 20 MG PO TABS: ORAL_TABLET | ORAL | Status: DC

## 2015-08-30 ENCOUNTER — Other Ambulatory Visit: Payer: Self-pay

## 2015-08-30 ENCOUNTER — Encounter: Payer: Self-pay | Admitting: Physician Assistant

## 2015-08-30 ENCOUNTER — Ambulatory Visit (INDEPENDENT_AMBULATORY_CARE_PROVIDER_SITE_OTHER): Payer: 59 | Admitting: Physician Assistant

## 2015-08-30 VITALS — BP 140/80 | HR 74 | Temp 97.3°F | Resp 16 | Ht 63.5 in | Wt 170.8 lb

## 2015-08-30 DIAGNOSIS — E559 Vitamin D deficiency, unspecified: Secondary | ICD-10-CM | POA: Diagnosis not present

## 2015-08-30 DIAGNOSIS — M109 Gout, unspecified: Secondary | ICD-10-CM

## 2015-08-30 DIAGNOSIS — M10072 Idiopathic gout, left ankle and foot: Secondary | ICD-10-CM

## 2015-08-30 DIAGNOSIS — E785 Hyperlipidemia, unspecified: Secondary | ICD-10-CM

## 2015-08-30 DIAGNOSIS — E876 Hypokalemia: Secondary | ICD-10-CM | POA: Diagnosis not present

## 2015-08-30 DIAGNOSIS — D649 Anemia, unspecified: Secondary | ICD-10-CM | POA: Diagnosis not present

## 2015-08-30 DIAGNOSIS — I10 Essential (primary) hypertension: Secondary | ICD-10-CM

## 2015-08-30 LAB — LIPID PANEL
CHOL/HDL RATIO: 2.9 ratio (ref <=5.0)
Cholesterol: 172 mg/dL (ref 125–200)
HDL: 60 mg/dL (ref >=46)
LDL CALC: 77 mg/dL (ref <130)
TRIGLYCERIDES: 175 mg/dL — AB (ref <150)
VLDL: 35 mg/dL — AB (ref <30)

## 2015-08-30 LAB — MAGNESIUM: Magnesium: 2.1 mg/dL (ref 1.5–2.5)

## 2015-08-30 LAB — BASIC METABOLIC PANEL WITH GFR
BUN: 19 mg/dL (ref 7–25)
CO2: 24 mmol/L (ref 20–31)
Calcium: 9.7 mg/dL (ref 8.6–10.4)
Chloride: 103 mmol/L (ref 98–110)
Creat: 1.16 mg/dL — ABNORMAL HIGH (ref 0.50–1.05)
GFR, EST NON AFRICAN AMERICAN: 53 mL/min — AB (ref >=60)
GFR, Est African American: 61 mL/min (ref >=60)
GLUCOSE: 80 mg/dL (ref 65–99)
POTASSIUM: 3.2 mmol/L — AB (ref 3.5–5.3)
SODIUM: 141 mmol/L (ref 135–146)

## 2015-08-30 LAB — CBC WITH DIFFERENTIAL/PLATELET
Basophils Absolute: 0 10*3/uL (ref 0.0–0.1)
Basophils Relative: 0 % (ref 0–1)
EOS ABS: 0.2 10*3/uL (ref 0.0–0.7)
EOS PCT: 1 % (ref 0–5)
HCT: 40.9 % (ref 36.0–46.0)
Hemoglobin: 13.3 g/dL (ref 12.0–15.0)
LYMPHS ABS: 5.6 10*3/uL — AB (ref 0.7–4.0)
Lymphocytes Relative: 28 % (ref 12–46)
MCH: 33.1 pg (ref 26.0–34.0)
MCHC: 32.5 g/dL (ref 30.0–36.0)
MCV: 101.7 fL — AB (ref 78.0–100.0)
MONOS PCT: 7 % (ref 3–12)
MPV: 11 fL (ref 8.6–12.4)
Monocytes Absolute: 1.4 10*3/uL — ABNORMAL HIGH (ref 0.1–1.0)
Neutro Abs: 12.7 10*3/uL — ABNORMAL HIGH (ref 1.7–7.7)
Neutrophils Relative %: 64 % (ref 43–77)
PLATELETS: 293 10*3/uL (ref 150–400)
RBC: 4.02 MIL/uL (ref 3.87–5.11)
RDW: 14.4 % (ref 11.5–15.5)
WBC: 19.9 10*3/uL — ABNORMAL HIGH (ref 4.0–10.5)

## 2015-08-30 LAB — HEPATIC FUNCTION PANEL
ALK PHOS: 55 U/L (ref 33–130)
ALT: 27 U/L (ref 6–29)
AST: 22 U/L (ref 10–35)
Albumin: 4 g/dL (ref 3.6–5.1)
BILIRUBIN DIRECT: 0.1 mg/dL (ref <=0.2)
BILIRUBIN INDIRECT: 0.3 mg/dL (ref 0.2–1.2)
TOTAL PROTEIN: 6.6 g/dL (ref 6.1–8.1)
Total Bilirubin: 0.4 mg/dL (ref 0.2–1.2)

## 2015-08-30 LAB — TSH: TSH: 2.15 mIU/L

## 2015-08-30 LAB — URIC ACID: URIC ACID, SERUM: 6.9 mg/dL (ref 2.4–7.0)

## 2015-08-30 MED ORDER — ALLOPURINOL 300 MG PO TABS: 300.0000 mg | ORAL_TABLET | Freq: Every day | ORAL | Status: DC

## 2015-08-30 NOTE — Patient Instructions (Addendum)
You can start on allopurinol 300mg , start on 1/2 pill a day This can sometime CAUSE a gout flare so take the colchcine once a day for a week If you start to have pain take the colchicine twice daily   Information for patients with Gout  Gout defined-Gout occurs when urate crystals accumulate in your joint causing the inflammation and intense pain of gout attack.  Urate crystals can form when you have high levels of uric acid in your blood.  Your body produces uric acid when it breaks down prurines-substances that are found naturally in your body, as well as in certain foods such as organ meats, anchioves, herring, asparagus, and mushrooms.  Normally uric acid dissolves in your blood and passes through your kidneys into your urine.  But sometimes your body either produces too much uric acid or your kidneys excrete too little uric acid.  When this happens, uric acid can build up, forming sharp needle-like urate crystals in a joint or surrounding tissue that cause pain, inflammation and swelling.    Gout is characterized by sudden, severe attacks of pain, redness and tenderness in joints, often the joint at the base of the big toe.  Gout is complex form of arthritis that can affect anyone.  Men are more likely to get gout but women become increasingly more susceptible to gout after menopause.  An acute attack of gout can wake you up in the middle of the night with the sensation that your big toe is on fire.  The affected joint is hot, swollen and so tender that even the weight or the sheet on it may seem intolerable.  If you experience symptoms of an acute gout attack it is important to your doctor as soon as the symptoms start.  Gout that goes untreated can lead to worsening pain and joint damage.  Risk Factors:  You are more likely to develop gout if you have high levels of uric acid in your body.    Factors that increase the uric acid level in your body include:  Lifestyle factors.  Excessive  alcohol use-generally more than two drinks a day for men and more than one for women increase the risk of gout.  Medical conditions.  Certain conditions make it more likely that you will develop gout.  These include hypertension, and chronic conditions such as diabetes, high levels of fat and cholesterol in the blood, and narrowing of the arteries.  Certain medications.  The uses of Thiazide diuretics- commonly used to treat hypertension and low dose aspirin can also increase uric acid levels.  Family history of gout.  If other members of your family have had gout, you are more likely to develop the disease.  Age and sex. Gout occurs more often in men than it does in women, primarily because women tend to have lower uric acid levels than men do.  Men are more likely to develop gout earlier usually between the ages of 36-50- whereas women generally develop signs and symptoms after menopause.    Tests and diagnosis:  Tests to help diagnose gout may include:  Blood test.  Your doctor may recommend a blood test to measure the uric acid level in your blood .  Blood tests can be misleading, though.  Some people have high uric acid levels but never experience gout.  And some people have signs and symptoms of gout, but don't have unusual levels of uric acid in their blood.  Joint fluid test.  Your doctor may  use a needle to draw fluid from your affected joint.  When examined under the microscope, your joint fluid may reveal urate crystals.  Treatment:  Treatment for gout usually involves medications.  What medications you and your doctor choose will be based on your current health and other medications you currently take.  Gout medications can be used to treat acute gout attacks and prevent future attacks as well as reduce your risk of complications from gout such as the development of tophi from urate crystal deposits.  Alternative medicine:   Certain foods have been studied for their potential to  lower uric acid levels, including:  Coffee.  Studies have found an association between coffee drinking (regular and decaf) and lower uric acid levels.  The evidence is not enough to encourage non-coffee drinkers to start, but it may give clues to new ways of treating gout in the future.  Vitamin C.  Supplements containing vitamin C may reduce the levels of uric acid in your blood.  However, vitamin as a treatment for gout. Don't assume that if a little vitamin C is good, than lots is better.  Megadoses of vitamin C may increase your bodies uric acid levels.  Cherries.  Cherries have been associated with lower levels of uric acid in studies, but it isn't clear if they have any effect on gout signs and symptoms.  Eating more cherries and other dar-colored fruits, such as blackberries, blueberries, purple grapes and raspberries, may be a safe way to support your gout treatment.    Lifestyle/Diet Recommendations:   Drink 8 to 16 cups ( about 2 to 4 liters) of fluid each day, with at least half being water.  Avoid alcohol  Eat a moderate amount of protein, preferably from healthy sources, such as low-fat or fat-free dairy, tofu, eggs, and nut butters.  Limit you daily intake of meat, fish, and poultry to 4 to 6 ounces.  Avoid high fat meats and desserts.  Decrease you intake of shellfish, beef, lamb, pork, eggs and cheese.  Choose a good source of vitamin C daily such as citrus fruits, strawberries, broccoli,  brussel sprouts, papaya, and cantaloupe.   Choose a good source of vitamin A every other day such as yellow fruits, or dark green/yellow vegetables.  Avoid drastic weigh reduction or fasting.  If weigh loss is desired lose it over a period of several months.  See "dietary considerations.." chart for specific food recommendations.  Dietary Considerations for people with Gout  Food with negligible purine content (0-15 mg of purine nitrogen per 100 grams food)  May use as desired  except on calorie variations  Non fat milk Cocoa Cereals (except in list II) Hard candies  Buttermilk Carbonated drinks Vegetables (except in list II) Sherbet  Coffee Fruits Sugar Honey  Tea Cottage Cheese Gelatin-jell-o Salt  Fruit juice Breads Angel food Cake   Herbs/spices Jams/Jellies [employer]    Foods that do not contain excessive purine content, but must be limited due to fat content  Cream Eggs Oil and Salad Dressing  Half and Half Peanut Butter Chocolate  Whole Milk Cakes Potato Chips  Butter Ice Cream Fried Foods  Cheese Nuts Waffles, pancakes   List II: Food with moderate purine content (50-150 mg of purine nitrogen per 100 grams of food)  Limit total amount each day to 5 oz. cooked Lean meat, other than those on list III   Poultry, other than those on list III Fish, other than those on list III  Seafood, other than those on list III  These foods may be used occasionally  Peas Lentils Bran  Spinach Oatmeal Dried Beans and Peas  Asparagus Wheat Germ Mushrooms   Additional information about meat choices  Choose fish and poultry, particularly without skin, often.  Select lean, well trimmed cuts of meat.  Avoid all fatty meats, bacon , sausage, fried meats, fried fish, or poultry, luncheon meats, cold cuts, hot dogs, meats canned or frozen in gravy, spareribs and frozen and packaged prepared meats.   List III: Foods with HIGH purine content / Foods to AVOID (150-800 mg of purine nitrogen per 100 grams of food)  Anchovies Herring Meat Broths  Liver Mackerel Meat Extracts  Kidney Scallops Meat Drippings  Sardines Wild Game Mincemeat  Sweetbreads Goose Gravy  Heart Tongue Yeast, baker's and brewers   Commercial soups made with any of the foods listed in List II or List III  In addition avoid all alcoholic beverages  Monitor your blood pressure at home. Go to the ER if any CP, SOB, nausea, dizziness, severe HA, changes vision/speech  Goal BP:   For patients younger than 60: Goal BP < 140/90. For patients 60 and older: Goal BP < 150/90. For patients with diabetes: Goal BP < 140/90. Your most recent BP: BP: 140/80 mmHg   Take your medications faithfully as instructed. Maintain a healthy weight. Get at least 150 minutes of aerobic exercise per week. Minimize salt intake. Minimize alcohol intake  DASH Eating Plan DASH stands for "Dietary Approaches to Stop Hypertension." The DASH eating plan is a healthy eating plan that has been shown to reduce high blood pressure (hypertension). Additional health benefits may include reducing the risk of type 2 diabetes mellitus, heart disease, and stroke. The DASH eating plan may also help with weight loss. WHAT DO I NEED TO KNOW ABOUT THE DASH EATING PLAN? For the DASH eating plan, you will follow these general guidelines:  Choose foods with a percent daily value for sodium of less than 5% (as listed on the food label).  Use salt-free seasonings or herbs instead of table salt or sea salt.  Check with your health care provider or pharmacist before using salt substitutes.  Eat lower-sodium products, often labeled as "lower sodium" or "no salt added."  Eat fresh foods.  Eat more vegetables, fruits, and low-fat dairy products.  Choose whole grains. Look for the word "whole" as the first word in the ingredient list.  Choose fish and skinless chicken or [place] more often than red meat. Limit fish, poultry, and meat to 6 oz (170 g) each day.  Limit sweets, desserts, sugars, and sugary drinks.  Choose heart-healthy fats.  Limit cheese to 1 oz (28 g) per day.  Eat more home-cooked food and less restaurant, buffet, and fast food.  Limit fried foods.  Cook foods using methods other than frying.  Limit canned vegetables. If you do use them, rinse them well to decrease the sodium.  When eating at a restaurant, ask that your food be prepared with less salt, or no salt if possible. WHAT  FOODS CAN I EAT? Seek help from a dietitian for individual calorie needs. Grains Whole grain or whole wheat bread. Brown rice. Whole grain or whole wheat pasta. Quinoa, bulgur, and whole grain cereals. Low-sodium cereals. Corn or whole wheat flour tortillas. Whole grain cornbread. Whole grain crackers. Low-sodium crackers. Vegetables Fresh or frozen vegetables (raw, steamed, roasted, or grilled). Low-sodium or reduced-sodium tomato and vegetable juices. Low-sodium or reduced-sodium  tomato sauce and paste. Low-sodium or reduced-sodium canned vegetables.  Fruits All fresh, canned (in natural juice), or frozen fruits. Meat and Other Protein Products Ground beef (85% or leaner), grass-fed beef, or beef trimmed of fat. Skinless chicken or [place]. Ground chicken or [place]. Pork trimmed of fat. All fish and seafood. Eggs. Dried beans, peas, or lentils. Unsalted nuts and seeds. Unsalted canned beans. Dairy Low-fat dairy products, such as skim or 1% milk, 2% or reduced-fat cheeses, low-fat ricotta or cottage cheese, or plain low-fat yogurt. Low-sodium or reduced-sodium cheeses. Fats and Oils Tub margarines without trans fats. Light or reduced-fat mayonnaise and salad dressings (reduced sodium). Avocado. Safflower, olive, or canola oils. Natural peanut or almond butter. Other Unsalted popcorn and pretzels. The items listed above may not be a complete list of recommended foods or beverages. Contact your dietitian for more options. WHAT FOODS ARE NOT RECOMMENDED? Grains White bread. White pasta. White rice. Refined cornbread. Bagels and croissants. Crackers that contain trans fat. Vegetables Creamed or fried vegetables. Vegetables in a cheese sauce. Regular canned vegetables. Regular canned tomato sauce and paste. Regular tomato and vegetable juices. Fruits Dried fruits. Canned fruit in light or heavy syrup. Fruit juice. Meat and Other Protein Products Fatty cuts of meat. Ribs, chicken wings, bacon,  sausage, bologna, salami, chitterlings, fatback, hot dogs, bratwurst, and packaged luncheon meats. Salted nuts and seeds. Canned beans with salt. Dairy Whole or 2% milk, cream, half-and-half, and cream cheese. Whole-fat or sweetened yogurt. Full-fat cheeses or blue cheese. Nondairy creamers and whipped toppings. Processed cheese, cheese spreads, or cheese curds. Condiments Onion and garlic salt, seasoned salt, table salt, and sea salt. Canned and packaged gravies. Worcestershire sauce. Tartar sauce. Barbecue sauce. Teriyaki sauce. Soy sauce, including reduced sodium. Steak sauce. Fish sauce. Oyster sauce. Cocktail sauce. Horseradish. Ketchup and mustard. Meat flavorings and tenderizers. Bouillon cubes. Hot sauce. Tabasco sauce. Marinades. Taco seasonings. Relishes. Fats and Oils Butter, stick margarine, lard, shortening, ghee, and bacon fat. Coconut, palm kernel, or palm oils. Regular salad dressings. Other Pickles and olives. Salted popcorn and pretzels. The items listed above may not be a complete list of foods and beverages to avoid. Contact your dietitian for more information. WHERE CAN I FIND MORE INFORMATION? National Heart, Lung, and Blood Institute: travelstabloid.com Document Released: 06/05/2011 Document Revised: 10/31/2013 Document Reviewed: 04/20/2013 [facility] Patient Information 2015 [place], [place]. This information is not intended to replace advice given to you by your health care provider. Make sure you discuss any questions you have with your health care provider.

## 2015-08-30 NOTE — Progress Notes (Signed)
Assessment and Plan:  1. Hypertension -Monitor BP at home, states has been good at home, call if above 140/90. Continue DASH diet.  Reminder to go to the ER if any CP, SOB, nausea, dizziness, severe HA, changes vision/speech, left arm numbness and tingling and jaw pain.  2. Cholesterol -Continue diet and exercise. Check cholesterol.   3. Prediabetes  -Continue diet and exercise. Check A1C  4. Vitamin D Def - check level and continue medications.   5. Gout Recheck uric acid not during flare, get on allopurinol 1/2 pill, take colchicine if needed.   Continue diet and meds as discussed. Further disposition pending results of labs. Over 30 minutes of exam, counseling, chart review, and critical decision making was performed  HPI 55 y.o. female  presents for 3 month follow up on hypertension, cholesterol, prediabetes, and vitamin D deficiency and gout flare.   Her blood pressure has been controlled at home, she is on atenolol at night 50mg , today their BP is BP: 140/80 mmHg   She was seen 07/19/2015 for possible gout, given prednisone, colchicine and suppose to follow up in 2-4 weeks, ran out of the medication and had another flare on 08/23/2015, she was sent back in colchicine and prednisone and states that her left foot is doing better. Her HCTZ was switched to lasix last visit. Lab Results  Component Value Date   LABURIC 6.3 07/19/2015    She does workout. She denies chest pain, shortness of breath, dizziness.  She is not on cholesterol medication and denies myalgias. Her cholesterol is at goal. The cholesterol last visit was:   Lab Results  Component Value Date   CHOL 164 12/07/2014   HDL 57 12/07/2014   LDLCALC 81 12/07/2014   TRIG 131 12/07/2014   CHOLHDL 2.9 12/07/2014   Last A1C in the office was:  Lab Results  Component Value Date   HGBA1C 5.3 11/29/2013   Patient is on Vitamin D supplement.   Lab Results  Component Value Date   VD25OH 47 12/07/2014     Her dad is  in the hospital and being transferred to NH, and her mother is in assisted living and may be going to the hospital today. She is on wellbutrin for stress, states it is helping.    Current Medications:  Current Outpatient Prescriptions on File Prior to Visit  Medication Sig Dispense Refill  . albuterol (PROVENTIL HFA;VENTOLIN HFA) 108 (90 BASE) MCG/ACT inhaler Inhale 1-2 puffs into the lungs every 6 (six) hours as needed for wheezing or shortness of breath. 18 g 2  . ALPRAZolam (XANAX) 0.5 MG tablet Take 1 tablet (0.5 mg total) by mouth 3 (three) times daily as needed for anxiety. 90 tablet 1  . Ascorbic Acid (VITAMIN C PO) Take by mouth daily.    Marland Kitchen buPROPion (WELLBUTRIN XL) 150 MG 24 hr tablet Take 1 tablet by mouth  every morning 90 tablet 1  . furosemide (LASIX) 20 MG tablet Take 1 tablet (20 mg total) by mouth daily. 90 tablet 3  . levonorgestrel-ethinyl estradiol (NORDETTE) 0.15-30 MG-MCG tablet Take 1 tablet by mouth daily. 28 tablet 0  . MAGNESIUM PO Take 400 mg by mouth daily.     . montelukast (SINGULAIR) 10 MG tablet Take 1 tablet (10 mg total) by mouth at bedtime. 90 tablet 4  . omeprazole (PRILOSEC) 20 MG capsule Take 20 mg by mouth 2 (two) times daily before a meal.    . predniSONE (DELTASONE) 20 MG tablet 2 tablets daily  for 3 days, 1 tablet daily for 4 days. 10 tablet 0  . traMADol (ULTRAM) 50 MG tablet TAKE 1 TABLET BY MOUTH EVERY 6 HOURS AS NEEDED FOR PAIN 90 tablet 0  . valACYclovir (VALTREX) 500 MG tablet Take 500 mg by mouth daily as needed.    . valsartan (DIOVAN) 320 MG tablet Take 1 tablet by mouth  daily 90 tablet 3   No current facility-administered medications on file prior to visit.   Medical History:  Past Medical History  Diagnosis Date  . Hypertension   . Allergy   . Anemia   . Anxiety   . Asthma    Allergies:  Allergies  Allergen Reactions  . Ciprofloxacin Nausea Only    Headache  . Penicillins     Review of Systems:  Review of Systems   Constitutional: Negative.   HENT: Negative.   Eyes: Negative.   Respiratory: Negative.   Cardiovascular: Negative.   Gastrointestinal: Negative.   Genitourinary: Negative.   Musculoskeletal: Positive for back pain. Negative for myalgias, joint pain, falls and neck pain.  Skin: Negative.   Neurological: Negative.   Endo/Heme/Allergies: Negative.   Psychiatric/Behavioral: Negative for depression, suicidal ideas, hallucinations, memory loss and substance abuse. The patient is nervous/anxious. The patient does not have insomnia.     Family history- Review and unchanged Social history- Review and unchanged Physical Exam: BP 140/80 mmHg  Pulse 74  Temp(Src) 97.3 F (36.3 C) (Temporal)  Resp 16  Ht 5' 3.5" (1.613 m)  Wt 170 lb 12.8 oz (77.474 kg)  BMI 29.78 kg/m2  SpO2 96%  LMP 08/08/2015 Wt Readings from Last 3 Encounters:  08/30/15 170 lb 12.8 oz (77.474 kg)  07/19/15 173 lb (78.472 kg)  06/18/15 169 lb (76.658 kg)   General Appearance: Well nourished, in no apparent distress. Eyes: PERRLA, EOMs, conjunctiva no swelling or erythema Sinuses: No Frontal/maxillary tenderness ENT/Mouth: Ext aud canals clear, TMs without erythema, bulging. No erythema, swelling, or exudate on post pharynx.  Tonsils not swollen or erythematous. Hearing normal.  Neck: Supple, thyroid normal.  Respiratory: Respiratory effort normal, BS equal bilaterally without rales, rhonchi, wheezing or stridor.  Cardio: RRR with no MRGs. Brisk peripheral pulses without edema.  Abdomen: Soft, + BS,  Non tender, no guarding, rebound, hernias, masses. Lymphatics: Non tender without lymphadenopathy.  Musculoskeletal: Full ROM, 5/5 strength, Normal gait. Skin: Warm, dry without rashes, lesions, ecchymosis.  Neuro: Cranial nerves intact. Normal muscle tone, no cerebellar symptoms. Psych: Awake and oriented X 3, normal affect, Insight and Judgment appropriate.    Vicie Mutters, PA-C 2:13 PM [facility]

## 2015-08-31 ENCOUNTER — Other Ambulatory Visit: Payer: Self-pay | Admitting: Physician Assistant

## 2015-08-31 DIAGNOSIS — I10 Essential (primary) hypertension: Secondary | ICD-10-CM

## 2015-08-31 DIAGNOSIS — D649 Anemia, unspecified: Secondary | ICD-10-CM

## 2015-08-31 DIAGNOSIS — E876 Hypokalemia: Secondary | ICD-10-CM

## 2015-08-31 DIAGNOSIS — M109 Gout, unspecified: Secondary | ICD-10-CM

## 2015-08-31 LAB — VITAMIN D 25 HYDROXY (VIT D DEFICIENCY, FRACTURES): VIT D 25 HYDROXY: 51 ng/mL (ref 30–100)

## 2015-09-03 ENCOUNTER — Other Ambulatory Visit: Payer: Self-pay | Admitting: *Deleted

## 2015-09-03 MED ORDER — ATENOLOL 100 MG PO TABS: 100.0000 mg | ORAL_TABLET | Freq: Every day | ORAL | Status: DC

## 2015-09-04 ENCOUNTER — Other Ambulatory Visit: Payer: Self-pay | Admitting: Physician Assistant

## 2015-09-05 ENCOUNTER — Other Ambulatory Visit: Payer: Self-pay | Admitting: *Deleted

## 2015-09-05 MED ORDER — ATENOLOL 100 MG PO TABS: 100.0000 mg | ORAL_TABLET | Freq: Every day | ORAL | Status: DC

## 2015-09-06 ENCOUNTER — Other Ambulatory Visit: Payer: Self-pay | Admitting: *Deleted

## 2015-09-06 MED ORDER — ATENOLOL 100 MG PO TABS: 100.0000 mg | ORAL_TABLET | Freq: Every day | ORAL | Status: DC

## 2015-09-16 ENCOUNTER — Other Ambulatory Visit: Payer: Self-pay | Admitting: Physician Assistant

## 2015-09-16 ENCOUNTER — Other Ambulatory Visit: Payer: Self-pay | Admitting: Internal Medicine

## 2015-09-17 ENCOUNTER — Ambulatory Visit: Payer: Self-pay | Admitting: Physician Assistant

## 2015-09-17 ENCOUNTER — Other Ambulatory Visit: Payer: Self-pay

## 2015-09-17 MED ORDER — ALLOPURINOL 300 MG PO TABS: 300.0000 mg | ORAL_TABLET | Freq: Every day | ORAL | Status: DC

## 2015-09-20 ENCOUNTER — Other Ambulatory Visit: Payer: Self-pay | Admitting: Physician Assistant

## 2015-09-20 ENCOUNTER — Other Ambulatory Visit: Payer: 59

## 2015-09-20 DIAGNOSIS — E876 Hypokalemia: Secondary | ICD-10-CM

## 2015-09-20 DIAGNOSIS — D649 Anemia, unspecified: Secondary | ICD-10-CM

## 2015-09-20 DIAGNOSIS — M109 Gout, unspecified: Secondary | ICD-10-CM

## 2015-09-20 LAB — BASIC METABOLIC PANEL WITH GFR
BUN: 13 mg/dL (ref 7–25)
CALCIUM: 9 mg/dL (ref 8.6–10.4)
CO2: 25 mmol/L (ref 20–31)
CREATININE: 0.78 mg/dL (ref 0.50–1.05)
Chloride: 102 mmol/L (ref 98–110)
GFR, EST NON AFRICAN AMERICAN: 86 mL/min (ref >=60)
GLUCOSE: 86 mg/dL (ref 65–99)
Potassium: 4 mmol/L (ref 3.5–5.3)
Sodium: 137 mmol/L (ref 135–146)

## 2015-09-20 LAB — CBC WITH DIFFERENTIAL/PLATELET
BASOS ABS: 0.1 10*3/uL (ref 0.0–0.1)
Basophils Relative: 1 % (ref 0–1)
EOS ABS: 0.4 10*3/uL (ref 0.0–0.7)
EOS PCT: 4 % (ref 0–5)
HCT: 37 % (ref 36.0–46.0)
Hemoglobin: 12.3 g/dL (ref 12.0–15.0)
LYMPHS ABS: 2.8 10*3/uL (ref 0.7–4.0)
Lymphocytes Relative: 27 % (ref 12–46)
MCH: 33.2 pg (ref 26.0–34.0)
MCHC: 33.2 g/dL (ref 30.0–36.0)
MCV: 100 fL (ref 78.0–100.0)
MPV: 10.7 fL (ref 8.6–12.4)
Monocytes Absolute: 0.7 10*3/uL (ref 0.1–1.0)
Monocytes Relative: 7 % (ref 3–12)
Neutro Abs: 6.2 10*3/uL (ref 1.7–7.7)
Neutrophils Relative %: 61 % (ref 43–77)
Platelets: 339 10*3/uL (ref 150–400)
RBC: 3.7 MIL/uL — ABNORMAL LOW (ref 3.87–5.11)
RDW: 13.8 % (ref 11.5–15.5)
WBC: 10.2 10*3/uL (ref 4.0–10.5)

## 2015-09-20 LAB — URIC ACID: URIC ACID, SERUM: 2.8 mg/dL (ref 2.4–7.0)

## 2015-09-20 LAB — MAGNESIUM: Magnesium: 2 mg/dL (ref 1.5–2.5)

## 2015-09-20 MED ORDER — PREDNISONE 20 MG PO TABS: ORAL_TABLET | ORAL | Status: DC

## 2015-09-24 LAB — METHYLMALONIC ACID, SERUM: METHYLMALONIC ACID, QUANT: 78 nmol/L — AB (ref 87–318)

## 2015-09-26 ENCOUNTER — Encounter: Payer: Self-pay | Admitting: Physician Assistant

## 2015-10-03 ENCOUNTER — Encounter: Payer: Self-pay | Admitting: Physician Assistant

## 2015-10-03 ENCOUNTER — Other Ambulatory Visit: Payer: Self-pay

## 2015-10-03 DIAGNOSIS — F341 Dysthymic disorder: Secondary | ICD-10-CM

## 2015-10-03 MED ORDER — ALPRAZOLAM 0.5 MG PO TABS: 0.5000 mg | ORAL_TABLET | Freq: Three times a day (TID) | ORAL | Status: DC | PRN

## 2015-10-06 ENCOUNTER — Emergency Department (HOSPITAL_COMMUNITY): Payer: 59

## 2015-10-06 ENCOUNTER — Encounter (HOSPITAL_COMMUNITY): Payer: Self-pay | Admitting: Physical Medicine and Rehabilitation

## 2015-10-06 ENCOUNTER — Emergency Department (HOSPITAL_COMMUNITY)
Admission: EM | Admit: 2015-10-06 | Discharge: 2015-10-06 | Disposition: A | Discharge status: Home / Self Care | Payer: 59 | Attending: Emergency Medicine | Admitting: Emergency Medicine

## 2015-10-06 DIAGNOSIS — D649 Anemia, unspecified: Secondary | ICD-10-CM | POA: Diagnosis not present

## 2015-10-06 DIAGNOSIS — Y92410 Unspecified street and highway as the place of occurrence of the external cause: Secondary | ICD-10-CM | POA: Insufficient documentation

## 2015-10-06 DIAGNOSIS — J45909 Unspecified asthma, uncomplicated: Secondary | ICD-10-CM | POA: Diagnosis not present

## 2015-10-06 DIAGNOSIS — Z880 Allergy status to penicillin: Secondary | ICD-10-CM | POA: Diagnosis not present

## 2015-10-06 DIAGNOSIS — I10 Essential (primary) hypertension: Secondary | ICD-10-CM | POA: Diagnosis not present

## 2015-10-06 DIAGNOSIS — Y9389 Activity, other specified: Secondary | ICD-10-CM | POA: Insufficient documentation

## 2015-10-06 DIAGNOSIS — S2232XA Fracture of one rib, left side, initial encounter for closed fracture: Secondary | ICD-10-CM | POA: Diagnosis not present

## 2015-10-06 DIAGNOSIS — S6992XA Unspecified injury of left wrist, hand and finger(s), initial encounter: Secondary | ICD-10-CM | POA: Diagnosis present

## 2015-10-06 DIAGNOSIS — F419 Anxiety disorder, unspecified: Secondary | ICD-10-CM | POA: Diagnosis not present

## 2015-10-06 DIAGNOSIS — S63502A Unspecified sprain of left wrist, initial encounter: Secondary | ICD-10-CM | POA: Insufficient documentation

## 2015-10-06 DIAGNOSIS — Y998 Other external cause status: Secondary | ICD-10-CM | POA: Diagnosis not present

## 2015-10-06 LAB — I-STAT CHEM 8, ED
BUN: 12 mg/dL (ref 6–20)
CALCIUM ION: 1.24 mmol/L — AB (ref 1.12–1.23)
Chloride: 99 mmol/L — ABNORMAL LOW (ref 101–111)
Creatinine, Ser: 0.9 mg/dL (ref 0.44–1.00)
GLUCOSE: 90 mg/dL (ref 65–99)
HCT: 43 % (ref 36.0–46.0)
Hemoglobin: 14.6 g/dL (ref 12.0–15.0)
Potassium: 3.9 mmol/L (ref 3.5–5.1)
SODIUM: 141 mmol/L (ref 135–145)
TCO2: 24 mmol/L (ref 0–100)

## 2015-10-06 MED ORDER — MORPHINE SULFATE (PF) 4 MG/ML IV SOLN
4.0000 mg | Freq: Once | INTRAVENOUS | Status: AC
  Administered 2015-10-06: 4 mg via INTRAVENOUS
  Filled 2015-10-06: qty 1

## 2015-10-06 MED ORDER — OXYCODONE-ACETAMINOPHEN 5-325 MG PO TABS: ORAL_TABLET | ORAL | Status: DC

## 2015-10-06 NOTE — ED Notes (Signed)
Incentive spirometer given to patient. Education performed, pt demonstrated use. Has no further questions.
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
## 2015-10-06 NOTE — ED Notes (Signed)
Ortho tech called to place L wrist splint.

## 2015-10-06 NOTE — Discharge Instructions (Signed)
Take percocet for breakthrough pain, do not drink alcohol, drive, care for children or do other critical tasks while taking percocet.   It is very important that you take deep breaths to prevent lung collapse and infection.  Either use your incentive spirometer or take 10 deep breaths every hour to prevent lung collapse.  If you develop cough, fever or shortness of breath return immediately to the emergency room.   You were diagnosed with a right wrist sprain, a fracture of the scaphoid bone cannot be ruled out at this time. For this reason, please wear your splint at all times,  cover with a plastic bag when you bathe. You will need a repeat x-ray in 7-10 days to be sure this bone is not broken. Please follow with your primary doctor, or the orthopedist you have been referred to, or, you may return to the emergency room for the repeat x-ray.   Please follow with your primary care doctor in the next 2 days for a check-up. They must obtain records for further management.   Do not hesitate to return to the Emergency Department for any new, worsening or concerning symptoms.

## 2015-10-06 NOTE — Progress Notes (Signed)
Orthopedic Tech Progress Note Patient Details:  Valerie Hobbs Jan 01, 1961 [id number]  Ortho Devices Type of Ortho Device: Thumb spica splint Ortho Device/Splint Interventions: Application   [place] Pink 10/06/2015, 10:08 AM

## 2015-10-08 ENCOUNTER — Ambulatory Visit (INDEPENDENT_AMBULATORY_CARE_PROVIDER_SITE_OTHER): Payer: 59 | Admitting: Internal Medicine

## 2015-10-08 VITALS — BP 116/68 | HR 88 | Temp 99.3°F | Wt 164.0 lb

## 2015-10-08 DIAGNOSIS — M25532 Pain in left wrist: Secondary | ICD-10-CM

## 2015-10-08 DIAGNOSIS — S2232XA Fracture of one rib, left side, initial encounter for closed fracture: Secondary | ICD-10-CM

## 2015-10-08 MED ORDER — OXYCODONE-ACETAMINOPHEN 5-325 MG PO TABS: ORAL_TABLET | ORAL | Status: DC
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
## 2015-10-09 ENCOUNTER — Other Ambulatory Visit: Payer: Self-pay | Admitting: Physician Assistant

## 2015-10-10 ENCOUNTER — Other Ambulatory Visit: Payer: Self-pay | Admitting: Physician Assistant

## 2015-10-24 ENCOUNTER — Ambulatory Visit (HOSPITAL_COMMUNITY)
Admission: RE | Admit: 2015-10-24 | Discharge: 2015-10-24 | Disposition: A | Discharge status: Home / Self Care | Payer: 59 | Source: Ambulatory Visit | Attending: Internal Medicine | Admitting: Internal Medicine

## 2015-10-24 ENCOUNTER — Other Ambulatory Visit: Payer: Self-pay | Admitting: Internal Medicine

## 2015-10-24 DIAGNOSIS — S6000XS Contusion of unspecified finger without damage to nail, sequela: Secondary | ICD-10-CM

## 2015-10-24 DIAGNOSIS — M25532 Pain in left wrist: Secondary | ICD-10-CM

## 2015-10-24 DIAGNOSIS — X58XXXS Exposure to other specified factors, sequela: Secondary | ICD-10-CM | POA: Diagnosis not present

## 2015-10-31 ENCOUNTER — Encounter: Payer: Self-pay | Admitting: Internal Medicine

## 2015-10-31 ENCOUNTER — Ambulatory Visit (INDEPENDENT_AMBULATORY_CARE_PROVIDER_SITE_OTHER): Payer: 59 | Admitting: Internal Medicine

## 2015-10-31 VITALS — BP 142/80 | HR 78 | Temp 98.0°F | Resp 18 | Ht 63.5 in

## 2015-10-31 DIAGNOSIS — S2249XD Multiple fractures of ribs, unspecified side, subsequent encounter for fracture with routine healing: Secondary | ICD-10-CM

## 2015-10-31 MED ORDER — VALACYCLOVIR HCL 500 MG PO TABS: 500.0000 mg | ORAL_TABLET | Freq: Two times a day (BID) | ORAL | Status: DC

## 2015-10-31 MED ORDER — HYDROCODONE-ACETAMINOPHEN 5-325 MG PO TABS: 1.0000 | ORAL_TABLET | Freq: Four times a day (QID) | ORAL | Status: DC | PRN
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
## 2015-11-21 ENCOUNTER — Other Ambulatory Visit: Payer: Self-pay | Admitting: Internal Medicine

## 2015-11-21 MED ORDER — HYDROCODONE-ACETAMINOPHEN 5-325 MG PO TABS: 1.0000 | ORAL_TABLET | Freq: Four times a day (QID) | ORAL | Status: DC | PRN

## 2015-11-24 ENCOUNTER — Other Ambulatory Visit: Payer: Self-pay | Admitting: Physician Assistant

## 2015-12-12 ENCOUNTER — Ambulatory Visit (INDEPENDENT_AMBULATORY_CARE_PROVIDER_SITE_OTHER): Payer: 59 | Admitting: Physician Assistant

## 2015-12-12 ENCOUNTER — Encounter: Payer: Self-pay | Admitting: Physician Assistant

## 2015-12-12 VITALS — BP 140/100 | HR 110 | Temp 97.6°F | Resp 16 | Ht 62.5 in | Wt 159.8 lb

## 2015-12-12 DIAGNOSIS — E785 Hyperlipidemia, unspecified: Secondary | ICD-10-CM

## 2015-12-12 DIAGNOSIS — M25562 Pain in left knee: Secondary | ICD-10-CM | POA: Diagnosis not present

## 2015-12-12 DIAGNOSIS — I10 Essential (primary) hypertension: Secondary | ICD-10-CM

## 2015-12-12 DIAGNOSIS — E559 Vitamin D deficiency, unspecified: Secondary | ICD-10-CM | POA: Diagnosis not present

## 2015-12-12 DIAGNOSIS — D649 Anemia, unspecified: Secondary | ICD-10-CM

## 2015-12-12 DIAGNOSIS — R6889 Other general symptoms and signs: Secondary | ICD-10-CM | POA: Diagnosis not present

## 2015-12-12 DIAGNOSIS — T7840XD Allergy, unspecified, subsequent encounter: Secondary | ICD-10-CM

## 2015-12-12 DIAGNOSIS — Z136 Encounter for screening for cardiovascular disorders: Secondary | ICD-10-CM

## 2015-12-12 DIAGNOSIS — Z0001 Encounter for general adult medical examination with abnormal findings: Secondary | ICD-10-CM | POA: Diagnosis not present

## 2015-12-12 DIAGNOSIS — M109 Gout, unspecified: Secondary | ICD-10-CM | POA: Diagnosis not present

## 2015-12-12 DIAGNOSIS — J45909 Unspecified asthma, uncomplicated: Secondary | ICD-10-CM

## 2015-12-12 DIAGNOSIS — E876 Hypokalemia: Secondary | ICD-10-CM | POA: Diagnosis not present

## 2015-12-12 DIAGNOSIS — F419 Anxiety disorder, unspecified: Secondary | ICD-10-CM

## 2015-12-12 NOTE — Patient Instructions (Addendum)
Get xray left knee If negative will refer to ortho  Increase lasix to 2 20mg  in the AM, or 40mg  total Check BP  Monitor your blood pressure at home. Go to the ER if any CP, SOB, nausea, dizziness, severe HA, changes vision/speech  Goal BP:  For patients younger than 60: Goal BP < 140/90. For patients 60 and older: Goal BP < 150/90. For patients with diabetes: Goal BP < 140/90. Your most recent BP: BP: (!) 140/100 mmHg   Take your medications faithfully as instructed. Maintain a healthy weight. Get at least 150 minutes of aerobic exercise per week. Minimize salt intake. Minimize alcohol intake  DASH Eating Plan DASH stands for "Dietary Approaches to Stop Hypertension." The DASH eating plan is a healthy eating plan that has been shown to reduce high blood pressure (hypertension). Additional health benefits may include reducing the risk of type 2 diabetes mellitus, heart disease, and stroke. The DASH eating plan may also help with weight loss. WHAT DO I NEED TO KNOW ABOUT THE DASH EATING PLAN? For the DASH eating plan, you will follow these general guidelines:  Choose foods with a percent daily value for sodium of less than 5% (as listed on the food label).  Use salt-free seasonings or herbs instead of table salt or sea salt.  Check with your health care provider or pharmacist before using salt substitutes.  Eat lower-sodium products, often labeled as "lower sodium" or "no salt added."  Eat fresh foods.  Eat more vegetables, fruits, and low-fat dairy products.  Choose whole grains. Look for the word "whole" as the first word in the ingredient list.  Choose fish and skinless chicken or [place] more often than red meat. Limit fish, poultry, and meat to 6 oz (170 g) each day.  Limit sweets, desserts, sugars, and sugary drinks.  Choose heart-healthy fats.  Limit cheese to 1 oz (28 g) per day.  Eat more home-cooked food and less restaurant, buffet, and fast food.  Limit fried  foods.  Cook foods using methods other than frying.  Limit canned vegetables. If you do use them, rinse them well to decrease the sodium.  When eating at a restaurant, ask that your food be prepared with less salt, or no salt if possible. WHAT FOODS CAN I EAT? Seek help from a dietitian for individual calorie needs. Grains Whole grain or whole wheat bread. Brown rice. Whole grain or whole wheat pasta. Quinoa, bulgur, and whole grain cereals. Low-sodium cereals. Corn or whole wheat flour tortillas. Whole grain cornbread. Whole grain crackers. Low-sodium crackers. Vegetables Fresh or frozen vegetables (raw, steamed, roasted, or grilled). Low-sodium or reduced-sodium tomato and vegetable juices. Low-sodium or reduced-sodium tomato sauce and paste. Low-sodium or reduced-sodium canned vegetables.  Fruits All fresh, canned (in natural juice), or frozen fruits. Meat and Other Protein Products Ground beef (85% or leaner), grass-fed beef, or beef trimmed of fat. Skinless chicken or [place]. Ground chicken or [place]. Pork trimmed of fat. All fish and seafood. Eggs. Dried beans, peas, or lentils. Unsalted nuts and seeds. Unsalted canned beans. Dairy Low-fat dairy products, such as skim or 1% milk, 2% or reduced-fat cheeses, low-fat ricotta or cottage cheese, or plain low-fat yogurt. Low-sodium or reduced-sodium cheeses. Fats and Oils Tub margarines without trans fats. Light or reduced-fat mayonnaise and salad dressings (reduced sodium). Avocado. Safflower, olive, or canola oils. Natural peanut or almond butter. Other Unsalted popcorn and pretzels. The items listed above may not be a complete list of recommended foods or beverages.  Contact your dietitian for more options. WHAT FOODS ARE NOT RECOMMENDED? Grains White bread. White pasta. White rice. Refined cornbread. Bagels and croissants. Crackers that contain trans fat. Vegetables Creamed or fried vegetables. Vegetables in a cheese sauce. Regular  canned vegetables. Regular canned tomato sauce and paste. Regular tomato and vegetable juices. Fruits Dried fruits. Canned fruit in light or heavy syrup. Fruit juice. Meat and Other Protein Products Fatty cuts of meat. Ribs, chicken wings, bacon, sausage, bologna, salami, chitterlings, fatback, hot dogs, bratwurst, and packaged luncheon meats. Salted nuts and seeds. Canned beans with salt. Dairy Whole or 2% milk, cream, half-and-half, and cream cheese. Whole-fat or sweetened yogurt. Full-fat cheeses or blue cheese. Nondairy creamers and whipped toppings. Processed cheese, cheese spreads, or cheese curds. Condiments Onion and garlic salt, seasoned salt, table salt, and sea salt. Canned and packaged gravies. Worcestershire sauce. Tartar sauce. Barbecue sauce. Teriyaki sauce. Soy sauce, including reduced sodium. Steak sauce. Fish sauce. Oyster sauce. Cocktail sauce. Horseradish. Ketchup and mustard. Meat flavorings and tenderizers. Bouillon cubes. Hot sauce. Tabasco sauce. Marinades. Taco seasonings. Relishes. Fats and Oils Butter, stick margarine, lard, shortening, ghee, and bacon fat. Coconut, palm kernel, or palm oils. Regular salad dressings. Other Pickles and olives. Salted popcorn and pretzels. The items listed above may not be a complete list of foods and beverages to avoid. Contact your dietitian for more information. WHERE CAN I FIND MORE INFORMATION? National Heart, Lung, and Blood Institute: travelstabloid.com Document Released: 06/05/2011 Document Revised: 10/31/2013 Document Reviewed: 04/20/2013 [facility] Patient Information 2015 [place], [place]. This information is not intended to replace advice given to you by your health care provider. Make sure you discuss any questions you have with your health care provider.    Potassium Content of Foods  The body needs potassium to control blood pressure and to keep the muscles and nervous system healthy. Here  are some healthy foods below that are high in potassium. Also you can get the white label salt of "NO SALT" salt substitute, 1/4 teaspoon of this is equivalent to 75meq potassium.   FOODS AND DRINKS HIGH IN POTASSIUM FOODS MODERATE IN POTASSIUM   Fruits  Avocado (cubed),  c / 50 g.  Banana (sliced), 75 g.  Cantaloupe (cubed), 80 g.  Honeydew, 1 wedge / 85 g.  Kiwi (sliced), 90 g.  Nectarine, 1 small / 129 g.  Orange, 1 medium / 131 g. Vegetables  Artichoke,  of a medium / 64 g.  Asparagus (boiled), 90 g..  Broccoli (boiled), 78 g.  Brussels sprout (boiled), 78 g.  Butternut squash (baked), 103 g.  Chickpea (cooked), 82 g.  Green peas (cooked), 80 g.  Kidney beans (cooked), 5 tbsp / 55 g.  Lima beans (cooked),  c / 43 g.  Navy beans (cooked),  c / 61 g.  Spinach (cooked),  c / 45 g.  Sweet potato (baked),  c / 50 g.  Tomato (chopped or sliced), 90 g.  Vegetable juice.  White mushrooms (cooked), 78 g.  Yam (cooked or baked),  c / 34 g.  Zucchini squash (boiled), 90 g. Other Foods and Drinks  Almonds (whole),  c / 36 g.  Fish, 3 oz / 85 g.  Nonfat fruit variety yogurt, 123 g.  Pistachio nuts, 1 oz / 28 g.  Pumpkin seeds, 1 oz / 28 g.  Red meat (broiled, cooked, grilled), 3 oz / 85 g.  Scallops (steamed), 3 oz / 85 g.  Spaghetti sauce,  c / 66 g.  Sunflower  seeds (dry roasted), 1 oz / 28 g.  Veggie burger, 1 patty / 70 g. Fruits  Grapefruit,  of the fruit / [id number] g  Plums (sliced), 83 g.  Tangerine, 1 large / 120 g. Vegetables  Carrots (boiled), 78 g.  Carrots (sliced), 61 g.  Rhubarb (cooked with sugar), 120 g.  Rutabaga (cooked), 120 g.  Yellow snap beans (cooked), 63 g. Other Foods and Drinks   Chicken breast (roasted and chopped),  c / 70 g.  Pita bread, 1 large / 64 g.  Shrimp (steamed), 4 oz / 113 g.  Swiss cheese (diced), 70 g.
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
## 2015-12-13 LAB — URINALYSIS, MICROSCOPIC ONLY
CASTS: NONE SEEN [LPF]
CRYSTALS: NONE SEEN [HPF]
SQUAMOUS EPITHELIAL / LPF: NONE SEEN [HPF] (ref <=5)
WBC, UA: >60 WBC/HPF — AB (ref <=5)
YEAST: NONE SEEN [HPF]

## 2015-12-13 LAB — URINALYSIS, ROUTINE W REFLEX MICROSCOPIC
Bilirubin Urine: NEGATIVE
Glucose, UA: NEGATIVE
HGB URINE DIPSTICK: NEGATIVE
KETONES UR: NEGATIVE
NITRITE: POSITIVE — AB
PH: 6 (ref 5.0–8.0)
Protein, ur: NEGATIVE
Specific Gravity, Urine: 1.02 (ref 1.001–1.035)

## 2015-12-13 LAB — BASIC METABOLIC PANEL WITH GFR
BUN: 11 mg/dL (ref 7–25)
CALCIUM: 9.7 mg/dL (ref 8.6–10.4)
CO2: 21 mmol/L (ref 20–31)
CREATININE: 0.92 mg/dL (ref 0.50–1.05)
Chloride: 103 mmol/L (ref 98–110)
GFR, EST AFRICAN AMERICAN: 80 mL/min (ref >=60)
GFR, Est Non African American: 70 mL/min (ref >=60)
Glucose, Bld: 95 mg/dL (ref 65–99)
Potassium: 3.7 mmol/L (ref 3.5–5.3)
SODIUM: 143 mmol/L (ref 135–146)

## 2015-12-13 LAB — MAGNESIUM: MAGNESIUM: 2.3 mg/dL (ref 1.5–2.5)

## 2015-12-13 LAB — CBC WITH DIFFERENTIAL/PLATELET
BASOS ABS: 125 {cells}/uL (ref 0–200)
Basophils Relative: 1 %
EOS ABS: 250 {cells}/uL (ref 15–500)
EOS PCT: 2 %
HCT: 40.8 % (ref 35.0–45.0)
Hemoglobin: 13.7 g/dL (ref 11.7–15.5)
Lymphocytes Relative: 24 %
Lymphs Abs: 3000 cells/uL (ref 850–3900)
MCH: 33.7 pg — AB (ref 27.0–33.0)
MCHC: 33.6 g/dL (ref 32.0–36.0)
MCV: 100.2 fL — AB (ref 80.0–100.0)
MONOS PCT: 7 %
MPV: 10.2 fL (ref 7.5–12.5)
Monocytes Absolute: 875 cells/uL (ref 200–950)
NEUTROS ABS: 8250 {cells}/uL — AB (ref 1500–7800)
Neutrophils Relative %: 66 %
PLATELETS: 294 10*3/uL (ref 140–400)
RBC: 4.07 MIL/uL (ref 3.80–5.10)
RDW: 14.4 % (ref 11.0–15.0)
WBC: 12.5 10*3/uL — ABNORMAL HIGH (ref 3.8–10.8)

## 2015-12-13 LAB — LIPID PANEL
CHOL/HDL RATIO: 2.5 ratio (ref <=5.0)
CHOLESTEROL: 182 mg/dL (ref 125–200)
HDL: 73 mg/dL (ref >=46)
LDL CALC: 91 mg/dL (ref <130)
TRIGLYCERIDES: 92 mg/dL (ref <150)
VLDL: 18 mg/dL (ref <30)

## 2015-12-13 LAB — HEPATIC FUNCTION PANEL
ALBUMIN: 4.3 g/dL (ref 3.6–5.1)
ALT: 41 U/L — ABNORMAL HIGH (ref 6–29)
AST: 36 U/L — AB (ref 10–35)
Alkaline Phosphatase: 67 U/L (ref 33–130)
BILIRUBIN DIRECT: 0.1 mg/dL (ref <=0.2)
BILIRUBIN TOTAL: 0.4 mg/dL (ref 0.2–1.2)
Indirect Bilirubin: 0.3 mg/dL (ref 0.2–1.2)
Total Protein: 6.9 g/dL (ref 6.1–8.1)

## 2015-12-13 LAB — VITAMIN D 25 HYDROXY (VIT D DEFICIENCY, FRACTURES): Vit D, 25-Hydroxy: 83 ng/mL (ref 30–100)

## 2015-12-13 LAB — TSH: TSH: 1.57 m[IU]/L

## 2015-12-14 LAB — MICROALBUMIN / CREATININE URINE RATIO
Creatinine, Urine: 149 mg/dL (ref 20–320)
MICROALB UR: 2.2 mg/dL — AB
Microalb Creat Ratio: 15 mcg/mg creat (ref <30)

## 2015-12-14 MED ORDER — SULFAMETHOXAZOLE-TRIMETHOPRIM 800-160 MG PO TABS: 1.0000 | ORAL_TABLET | Freq: Two times a day (BID) | ORAL | Status: DC

## 2015-12-14 NOTE — Addendum Note (Signed)
Addended by: Vicie Mutters R on: 12/14/2015 08:37 AM   Modules accepted: Orders, SmartSet

## 2015-12-16 LAB — URINE CULTURE: Colony Count: >=100000

## 2015-12-17 ENCOUNTER — Other Ambulatory Visit: Payer: Self-pay | Admitting: *Deleted

## 2015-12-17 DIAGNOSIS — F341 Dysthymic disorder: Secondary | ICD-10-CM

## 2015-12-17 MED ORDER — ALPRAZOLAM 0.5 MG PO TABS: 0.5000 mg | ORAL_TABLET | Freq: Three times a day (TID) | ORAL | Status: DC | PRN

## 2015-12-25 ENCOUNTER — Encounter: Payer: Self-pay | Admitting: Internal Medicine

## 2016-01-15 ENCOUNTER — Other Ambulatory Visit: Payer: 59

## 2016-01-15 ENCOUNTER — Other Ambulatory Visit: Payer: Self-pay | Admitting: Physician Assistant

## 2016-01-15 DIAGNOSIS — N390 Urinary tract infection, site not specified: Secondary | ICD-10-CM

## 2016-01-15 DIAGNOSIS — Z79899 Other long term (current) drug therapy: Secondary | ICD-10-CM

## 2016-01-15 MED ORDER — FUROSEMIDE 40 MG PO TABS: 40.0000 mg | ORAL_TABLET | Freq: Every day | ORAL | Status: DC

## 2016-01-16 LAB — URINALYSIS, ROUTINE W REFLEX MICROSCOPIC
BILIRUBIN URINE: NEGATIVE
Glucose, UA: NEGATIVE
HGB URINE DIPSTICK: NEGATIVE
KETONES UR: NEGATIVE
Leukocytes, UA: NEGATIVE
NITRITE: NEGATIVE
Protein, ur: NEGATIVE
Specific Gravity, Urine: 1.007 (ref 1.001–1.035)
pH: 7.5 (ref 5.0–8.0)

## 2016-01-18 LAB — URINE CULTURE: Culture: >=100000

## 2016-01-21 ENCOUNTER — Other Ambulatory Visit: Payer: Self-pay

## 2016-01-21 ENCOUNTER — Other Ambulatory Visit: Payer: Self-pay | Admitting: Physician Assistant

## 2016-01-21 DIAGNOSIS — N390 Urinary tract infection, site not specified: Secondary | ICD-10-CM

## 2016-01-21 MED ORDER — SULFAMETHOXAZOLE-TRIMETHOPRIM 800-160 MG PO TABS: 1.0000 | ORAL_TABLET | Freq: Two times a day (BID) | ORAL | 0 refills | Status: DC

## 2016-01-30 ENCOUNTER — Other Ambulatory Visit: Payer: Self-pay | Admitting: Physician Assistant

## 2016-02-05 ENCOUNTER — Other Ambulatory Visit: Payer: Self-pay | Admitting: Internal Medicine

## 2016-02-07 ENCOUNTER — Other Ambulatory Visit: Payer: Self-pay

## 2016-02-07 MED ORDER — VALSARTAN 320 MG PO TABS: 320.0000 mg | ORAL_TABLET | Freq: Every day | ORAL | 1 refills | Status: DC

## 2016-02-22 ENCOUNTER — Other Ambulatory Visit: Payer: Self-pay | Admitting: Internal Medicine

## 2016-02-22 ENCOUNTER — Ambulatory Visit (HOSPITAL_COMMUNITY)
Admission: RE | Admit: 2016-02-22 | Discharge: 2016-02-22 | Disposition: A | Discharge status: Home / Self Care | Payer: 59 | Source: Ambulatory Visit | Attending: Physician Assistant | Admitting: Physician Assistant

## 2016-02-22 DIAGNOSIS — M25562 Pain in left knee: Secondary | ICD-10-CM | POA: Insufficient documentation

## 2016-02-26 ENCOUNTER — Other Ambulatory Visit: Payer: 59

## 2016-02-26 DIAGNOSIS — N390 Urinary tract infection, site not specified: Secondary | ICD-10-CM

## 2016-02-27 LAB — URINALYSIS, ROUTINE W REFLEX MICROSCOPIC
Bilirubin Urine: NEGATIVE
GLUCOSE, UA: NEGATIVE
HGB URINE DIPSTICK: NEGATIVE
KETONES UR: NEGATIVE
Leukocytes, UA: NEGATIVE
NITRITE: NEGATIVE
PH: 7 (ref 5.0–8.0)
Protein, ur: NEGATIVE
Specific Gravity, Urine: 1.008 (ref 1.001–1.035)

## 2016-02-27 LAB — URINE CULTURE: Organism ID, Bacteria: NO GROWTH

## 2016-03-18 ENCOUNTER — Other Ambulatory Visit: Payer: Self-pay | Admitting: *Deleted

## 2016-03-18 DIAGNOSIS — F341 Dysthymic disorder: Secondary | ICD-10-CM

## 2016-03-18 MED ORDER — ALPRAZOLAM 0.5 MG PO TABS: 0.5000 mg | ORAL_TABLET | Freq: Three times a day (TID) | ORAL | 0 refills | Status: DC | PRN

## 2016-03-20 ENCOUNTER — Ambulatory Visit (INDEPENDENT_AMBULATORY_CARE_PROVIDER_SITE_OTHER): Payer: 59 | Admitting: Physician Assistant

## 2016-03-20 ENCOUNTER — Encounter: Payer: Self-pay | Admitting: Physician Assistant

## 2016-03-20 VITALS — BP 126/80 | HR 80 | Temp 97.3°F | Resp 14 | Ht 62.5 in | Wt 164.2 lb

## 2016-03-20 DIAGNOSIS — Z79899 Other long term (current) drug therapy: Secondary | ICD-10-CM

## 2016-03-20 DIAGNOSIS — I10 Essential (primary) hypertension: Secondary | ICD-10-CM

## 2016-03-20 DIAGNOSIS — B354 Tinea corporis: Secondary | ICD-10-CM

## 2016-03-20 DIAGNOSIS — E785 Hyperlipidemia, unspecified: Secondary | ICD-10-CM | POA: Diagnosis not present

## 2016-03-20 MED ORDER — BUPROPION HCL ER (XL) 150 MG PO TB24: 300.0000 mg | ORAL_TABLET | ORAL | 1 refills | Status: DC

## 2016-03-20 MED ORDER — FLUCONAZOLE 150 MG PO TABS: 150.0000 mg | ORAL_TABLET | Freq: Every day | ORAL | 1 refills | Status: DC

## 2016-03-20 NOTE — Progress Notes (Signed)
Assessment and Plan:  1. Hypertension -increase atenolol to 100mg , monitor blood pressure at home. Continue DASH diet.  Reminder to go to the ER if any CP, SOB, nausea, dizziness, severe HA, changes vision/speech, left arm numbness and tingling and jaw pain.  2. Cholesterol -Continue diet and exercise. Check cholesterol.   3. Tinea corporis - fluconazole (DIFLUCAN) 150 MG tablet; Take 1 tablet (150 mg total) by mouth daily.  Dispense: 4 tablet; Refill: 1  4. Medication management   Continue diet and meds as discussed. Further disposition pending results of labs. Over 30 minutes of exam, counseling, chart review, and critical decision making was performed  HPI 54 y.o. female  presents for 3 month follow up on hypertension, cholesterol, prediabetes, and vitamin D deficiency.  Mom passed in August, she is doing well, still taking care of her 39 year old dad so some stress but denies trouble sleeping, stomach issues, etc.   Her blood pressure has been controlled at home, she is on atenolol at night 50mg , today their BP is    She does workout. She denies chest pain, shortness of breath, dizziness.  She is not on cholesterol medication and denies myalgias. Her cholesterol is at goal. The cholesterol last visit was:   Lab Results  Component Value Date   CHOL 182 12/12/2015   HDL 73 12/12/2015   LDLCALC 91 12/12/2015   TRIG 92 12/12/2015   CHOLHDL 2.5 12/12/2015   Last A1C in the office was:  Lab Results  Component Value Date   HGBA1C 5.3 11/29/2013   Patient is on Vitamin D supplement.   Lab Results  Component Value Date   VD25OH 83 12/12/2015     BMI is Body mass index is 29.55 kg/m., she is working on diet and exercise. Wt Readings from Last 3 Encounters:  03/20/16 164 lb 3.2 oz (74.5 kg)  12/12/15 159 lb 12.8 oz (72.5 kg)  10/08/15 164 lb (74.4 kg)   Patient is on allopurinol for gout and does not report a recent flare.  Lab Results  Component Value Date   LABURIC 2.8  09/20/2015    Current Medications:  Current Outpatient Prescriptions on File Prior to Visit  Medication Sig Dispense Refill  . albuterol (PROVENTIL HFA;VENTOLIN HFA) 108 (90 BASE) MCG/ACT inhaler Inhale 1-2 puffs into the lungs every 6 (six) hours as needed for wheezing or shortness of breath. 18 g 2  . allopurinol (ZYLOPRIM) 300 MG tablet Take 1 tablet by mouth  daily 90 tablet 1  . ALPRAZolam (XANAX) 0.5 MG tablet Take 1 tablet (0.5 mg total) by mouth 3 (three) times daily as needed for anxiety. 270 tablet 0  . Ascorbic Acid (VITAMIN C PO) Take by mouth daily.    Marland Kitchen atenolol (TENORMIN) 100 MG tablet Take 1 tablet by mouth  daily 90 tablet 1  . buPROPion (WELLBUTRIN XL) 150 MG 24 hr tablet TAKE 1 TABLET (150 MG TOTAL) BY MOUTH EVERY MORNING. 90 tablet 0  . cholecalciferol (VITAMIN D) 1000 units tablet Take 1,000 Units by mouth daily.    . Cyanocobalamin (B-12) 1000 MCG SUBL Place 1,000 mg under the tongue daily.    . furosemide (LASIX) 40 MG tablet Take 1 tablet (40 mg total) by mouth daily. 90 tablet 3  . levonorgestrel-ethinyl estradiol (NORDETTE) 0.15-30 MG-MCG tablet Take 1 tablet by mouth  daily 84 tablet 1  . MAGNESIUM PO Take 400 mg by mouth daily.     . milk thistle 175 MG tablet Take 175  mg by mouth daily.    Marland Kitchen MITIGARE 0.6 MG CAPS TAKE 1 CAPSULE EVERY DAY 90 capsule 1  . montelukast (SINGULAIR) 10 MG tablet Take 1 tablet by mouth at  bedtime 90 tablet 1  . Omega-3 Fatty Acids (FISH OIL) 1000 MG CAPS Take 1,000 mg by mouth daily.    Marland Kitchen omeprazole (PRILOSEC) 20 MG capsule Take 20 mg by mouth daily.     Marland Kitchen sulfamethoxazole-trimethoprim (BACTRIM DS,SEPTRA DS) 800-160 MG tablet Take 1 tablet by mouth 2 (two) times daily. 20 tablet 0  . traMADol (ULTRAM) 50 MG tablet TAKE 1 TABLET BY MOUTH EVERY 6 HOURS AS NEEDED FOR PAIN 90 tablet 0  . valACYclovir (VALTREX) 500 MG tablet Take 1 tablet (500 mg total) by mouth 2 (two) times daily. 60 tablet 0  . valsartan (DIOVAN) 320 MG tablet Take 1  tablet (320 mg total) by mouth daily. 90 tablet 1   No current facility-administered medications on file prior to visit.    Medical History:  Past Medical History:  Diagnosis Date  . Allergy   . Anemia   . Anxiety   . Asthma   . Hypertension    Allergies:  Allergies  Allergen Reactions  . Ciprofloxacin Nausea Only    Headache  . Penicillins     Allergy has been more than 59yrs but unsure of what reaction was    Review of Systems:  Review of Systems  Constitutional: Negative.   HENT: Negative.   Eyes: Negative.   Respiratory: Negative.   Cardiovascular: Negative.   Gastrointestinal: Negative.   Genitourinary: Negative.   Musculoskeletal: Positive for back pain. Negative for falls, joint pain, myalgias and neck pain.  Skin: Negative.   Neurological: Negative.   Endo/Heme/Allergies: Negative.   Psychiatric/Behavioral: Negative for depression, hallucinations, memory loss, substance abuse and suicidal ideas. The patient is nervous/anxious. The patient does not have insomnia.     Family history- Review and unchanged Social history- Review and unchanged Physical Exam: LMP 01/28/2016  Wt Readings from Last 3 Encounters:  12/12/15 159 lb 12.8 oz (72.5 kg)  10/08/15 164 lb (74.4 kg)  10/06/15 160 lb (72.6 kg)   General Appearance: Well nourished, in no apparent distress. Eyes: PERRLA, EOMs, conjunctiva no swelling or erythema Sinuses: No Frontal/maxillary tenderness ENT/Mouth: Ext aud canals clear, TMs without erythema, bulging. No erythema, swelling, or exudate on post pharynx.  Tonsils not swollen or erythematous. Hearing normal.  Neck: Supple, thyroid normal.  Respiratory: Respiratory effort normal, BS equal bilaterally without rales, rhonchi, wheezing or stridor.  Cardio: RRR with no MRGs. Brisk peripheral pulses without edema.  Abdomen: Soft, + BS,  Non tender, no guarding, rebound, hernias, masses. Lymphatics: Non tender without lymphadenopathy.  Musculoskeletal:  Full ROM, 5/5 strength, Normal gait Skin: Warm, dry without rashes, lesions, ecchymosis.  Neuro: Cranial nerves intact. Normal muscle tone, no cerebellar symptoms. Psych: Awake and oriented X 3, normal affect, Insight and Judgment appropriate.    Vicie Mutters, PA-C 1:41 PM [facility]

## 2016-03-27 ENCOUNTER — Other Ambulatory Visit: Payer: Self-pay | Admitting: Physician Assistant

## 2016-03-27 MED ORDER — TRAMADOL HCL 50 MG PO TABS: 50.0000 mg | ORAL_TABLET | Freq: Four times a day (QID) | ORAL | 0 refills | Status: DC | PRN

## 2016-04-02 ENCOUNTER — Encounter: Payer: Self-pay | Admitting: Physician Assistant

## 2016-04-02 MED ORDER — MONTELUKAST SODIUM 10 MG PO TABS: 10.0000 mg | ORAL_TABLET | Freq: Every day | ORAL | 1 refills | Status: DC

## 2016-04-02 MED ORDER — FUROSEMIDE 40 MG PO TABS: 40.0000 mg | ORAL_TABLET | Freq: Every day | ORAL | 3 refills | Status: DC

## 2016-04-17 ENCOUNTER — Other Ambulatory Visit: Payer: Self-pay | Admitting: Physician Assistant

## 2016-05-06 ENCOUNTER — Ambulatory Visit (INDEPENDENT_AMBULATORY_CARE_PROVIDER_SITE_OTHER): Payer: 59 | Admitting: Internal Medicine

## 2016-05-06 VITALS — BP 136/78 | HR 80 | Temp 98.0°F | Ht 62.5 in | Wt 163.0 lb

## 2016-05-06 DIAGNOSIS — S60221A Contusion of right hand, initial encounter: Secondary | ICD-10-CM

## 2016-05-06 DIAGNOSIS — W5503XA Scratched by cat, initial encounter: Secondary | ICD-10-CM | POA: Diagnosis not present

## 2016-05-06 MED ORDER — VALACYCLOVIR HCL 500 MG PO TABS: 500.0000 mg | ORAL_TABLET | Freq: Two times a day (BID) | ORAL | 0 refills | Status: DC

## 2016-05-06 MED ORDER — DOXYCYCLINE HYCLATE 100 MG PO CAPS: 100.0000 mg | ORAL_CAPSULE | Freq: Two times a day (BID) | ORAL | 0 refills | Status: DC

## 2016-05-06 MED ORDER — MELOXICAM 15 MG PO TABS: 15.0000 mg | ORAL_TABLET | Freq: Every day | ORAL | 0 refills | Status: DC

## 2016-05-06 NOTE — Patient Instructions (Addendum)
Hematoma A hematoma is a collection of blood under the skin, in an organ, in a body space, in a joint space, or in other tissue. The blood can clot to form a lump that you can see and feel. The lump is often firm and may sometimes become sore and tender. Most hematomas get better in a few days to weeks. However, some hematomas may be serious and require medical care. Hematomas can range in size from very small to very large. CAUSES  A hematoma can be caused by a blunt or penetrating injury. It can also be caused by spontaneous leakage from a blood vessel under the skin. Spontaneous leakage from a blood vessel is more likely to occur in older people, especially those taking blood thinners. Sometimes, a hematoma can develop after certain medical procedures. SIGNS AND SYMPTOMS   A firm lump on the body.  Possible pain and tenderness in the area.  Bruising.Blue, dark blue, purple-red, or yellowish skin may appear at the site of the hematoma if the hematoma is close to the surface of the skin. For hematomas in deeper tissues or body spaces, the signs and symptoms may be subtle. For example, an intra-abdominal hematoma may cause abdominal pain, weakness, fainting, and shortness of breath. An intracranial hematoma may cause a headache or symptoms such as weakness, trouble speaking, or a change in consciousness. DIAGNOSIS  A hematoma can usually be diagnosed based on your medical history and a physical exam. Imaging tests may be needed if your health care provider suspects a hematoma in deeper tissues or body spaces, such as the abdomen, head, or chest. These tests may include ultrasonography or a CT scan.  TREATMENT  Hematomas usually go away on their own over time. Rarely does the blood need to be drained out of the body. Large hematomas or those that may affect vital organs will sometimes need surgical drainage or monitoring. HOME CARE INSTRUCTIONS   Apply ice to the injured area:   Put ice in a  plastic bag.   Place a towel between your skin and the bag.   Leave the ice on for 20 minutes, 2-3 times a day for the first 1 to 2 days.   After the first 2 days, switch to using warm compresses on the hematoma.   Elevate the injured area to help decrease pain and swelling. Wrapping the area with an elastic bandage may also be helpful. Compression helps to reduce swelling and promotes shrinking of the hematoma. Make sure the bandage is not wrapped too tight.   If your hematoma is on a lower extremity and is painful, crutches may be helpful for a couple days.   Only take over-the-counter or prescription medicines as directed by your health care provider. SEEK IMMEDIATE MEDICAL CARE IF:   You have increasing pain, or your pain is not controlled with medicine.   You have a fever.   You have worsening swelling or discoloration.   Your skin over the hematoma breaks or starts bleeding.   Your hematoma is in your chest or abdomen and you have weakness, shortness of breath, or a change in consciousness.  Your hematoma is on your scalp (caused by a fall or injury) and you have a worsening headache or a change in alertness or consciousness. MAKE SURE YOU:   Understand these instructions.  Will watch your condition.  Will get help right away if you are not doing well or get worse.   This information is not intended to replace  advice given to you by your health care provider. Make sure you discuss any questions you have with your health care provider.   Document Released: 01/29/2004 Document Revised: 02/16/2013 Document Reviewed: 11/24/2012 Elsevier Interactive Patient Education 2016 Elsevier Inc[employer]  Phlebitis Phlebitis is soreness and swelling (inflammation) of a vein. This can occur in your arms, legs, or torso (trunk), as well as deeper inside your body. Phlebitis is usually not serious when it occurs close to the surface of the body. However, it can cause serious problems  when it occurs in a vein deeper inside the body. CAUSES  Phlebitis can be triggered by various things, including:   Reduced blood flow through your veins. This can happen with:  Bed rest over a long period.  Long-distance travel.  Injury.  Surgery.  Being overweight (obese) or pregnant.  Having an IV tube put in the vein and getting certain medicines through the vein.  Cancer and cancer treatment.  Use of illegal drugs taken through the vein.  Inflammatory diseases.  Inherited (genetic) diseases that increase the risk of blood clots.  Hormone therapy, such as birth control pills. SIGNS AND SYMPTOMS   Red, tender, swollen, and painful area on your skin. Usually, the area will be long and narrow.  Firmness along the center of the affected area. This can indicate that a blood clot has formed.  Low-grade fever. DIAGNOSIS  A health care provider can usually diagnose phlebitis by examining the affected area and asking about your symptoms. To check for infection or blood clots, your health care provider may order blood tests or an ultrasound exam of the area. Blood tests and your family history may also indicate if you have an underlying genetic disease that causes blood clots. Occasionally, a piece of tissue is taken from the body (biopsy sample) if an unusual cause of phlebitis is suspected. TREATMENT  Treatment will vary depending on the severity of the condition and the area of the body affected. Treatment may include:  Use of a warm compress or heating pad.  Use of compression stockings or bandages.  Anti-inflammatory medicines.  Removal of any IV tube that may be causing the problem.  Medicines that kill germs (antibiotics) if an infection is present.  Blood-thinning medicines if a blood clot is suspected or present.  In rare cases, surgery may be needed to remove damaged sections of vein. HOME CARE INSTRUCTIONS   Only take over-the-counter or prescription  medicines as directed by your health care provider. Take all medicines exactly as prescribed.  Raise (elevate) the affected area above the level of your heart as directed by your health care provider.  Apply a warm compress or heating pad to the affected area as directed by your health care provider. Do not sleep with the heating pad.  Use compression stockings or bandages as directed. These will speed healing and prevent the condition from coming back.  If you are on blood thinners:  Get follow-up blood tests as directed by your health care provider.  Check with your health care provider before using any new medicines.  Carry a medical alert card or wear your medical alert jewelry to show that you are on blood thinners.  For phlebitis in the legs:  Avoid prolonged standing or bed rest.  Keep your legs moving. Raise your legs when sitting or lying.  Do not smoke.  Women, particularly those over the age of 34, should consider the risks and benefits of taking the contraceptive pill. This kind of  hormone treatment can increase your risk for blood clots.  Follow up with your health care provider as directed. SEEK MEDICAL CARE IF:   You have unusual bruising or any bleeding problems.  Your swelling or pain in the affected area is not improving.  You are on anti-inflammatory medicine, and you develop belly (abdominal) pain. SEEK IMMEDIATE MEDICAL CARE IF:   You have a sudden onset of chest pain or difficulty breathing.  You have a fever or persistent symptoms for more than 2-3 days.  You have a fever and your symptoms suddenly get worse. MAKE SURE YOU:  Understand these instructions.  Will watch your condition.  Will get help right away if you are not doing well or get worse.   This information is not intended to replace advice given to you by your health care provider. Make sure you discuss any questions you have with your health care provider.   Document Released:  06/10/2001 Document Revised: 04/06/2013 Document Reviewed: 02/21/2013 Elsevier Interactive Patient Education [employer].

## 2016-05-06 NOTE — Progress Notes (Signed)
   Subjective:    Patient ID: Valerie Hobbs, female    DOB: 10-08-1960, 55 y.o.   MRN: [id number]  HPI   Patient presents to the office for evaluation of a cat scratch to her right hand which happened a week ago.  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  She reports that she was picking up a feral cat.  She reports that this has been getting increasingly red and swollen and increased bruising.  She did have an egg form with the initial scratch and since then it has been tender and painful.  The cat definitely didn't bite her.  She has an UTD tetanus shot.    Review of Systems  Constitutional: Negative for chills, fatigue and fever.  Gastrointestinal: Negative for nausea and vomiting.  Skin: Positive for wound. Negative for color change and rash.       Objective:   Physical Exam  Constitutional: She appears well-developed and well-nourished. No distress.  HENT:  Head: Normocephalic.  Mouth/Throat: Oropharynx is clear and moist. No oropharyngeal exudate.  Eyes: Conjunctivae are normal. No scleral icterus.  Neck: Normal range of motion. Neck supple. No JVD present. No thyromegaly present.  Cardiovascular: Normal rate, regular rhythm, normal heart sounds and intact distal pulses.  Exam reveals no gallop and no friction rub.   No murmur heard. Lymphadenopathy:    She has no cervical adenopathy.  Skin: Skin is warm and dry. She is not diaphoretic.     Nursing note and vitals reviewed.   Vitals:   05/06/16 1410  BP: 136/78  Pulse: 80  Temp: 98 F (36.7 C)          Assessment & Plan:    1. Traumatic hematoma of right hand, initial encounter -warm compresses -compress and elevate  2. Cat scratch -possible early cellulitis vs. Color change secondary to hematoma -doxycycline -mobic  Call office if worsening symptoms.  Will follow-up prn.  Patient up to date on her TDAP.

## 2016-05-15 ENCOUNTER — Encounter: Payer: Self-pay | Admitting: Physician Assistant

## 2016-05-15 MED ORDER — BUPROPION HCL ER (XL) 150 MG PO TB24: 300.0000 mg | ORAL_TABLET | ORAL | 1 refills | Status: DC

## 2016-05-29 ENCOUNTER — Other Ambulatory Visit: Payer: Self-pay | Admitting: Internal Medicine

## 2016-06-05 ENCOUNTER — Telehealth: Payer: Self-pay

## 2016-06-05 ENCOUNTER — Encounter: Payer: Self-pay | Admitting: Physician Assistant

## 2016-06-05 ENCOUNTER — Other Ambulatory Visit: Payer: Self-pay | Admitting: Physician Assistant

## 2016-06-05 DIAGNOSIS — F341 Dysthymic disorder: Secondary | ICD-10-CM

## 2016-06-05 MED ORDER — ALPRAZOLAM 0.5 MG PO TABS: 0.5000 mg | ORAL_TABLET | Freq: Three times a day (TID) | ORAL | 0 refills | Status: DC | PRN

## 2016-06-05 NOTE — Telephone Encounter (Signed)
Xanax called into pharmacy @ 9:27am

## 2016-06-17 ENCOUNTER — Other Ambulatory Visit: Payer: Self-pay | Admitting: Internal Medicine

## 2016-06-17 MED ORDER — TRAMADOL HCL 50 MG PO TABS: 50.0000 mg | ORAL_TABLET | Freq: Four times a day (QID) | ORAL | 0 refills | Status: DC | PRN

## 2016-06-17 NOTE — Telephone Encounter (Signed)
Tramadol was called into [facility] pharmacy @ 4:30pm

## 2016-07-21 ENCOUNTER — Other Ambulatory Visit: Payer: Self-pay | Admitting: Internal Medicine

## 2016-07-21 MED ORDER — VALACYCLOVIR HCL 500 MG PO TABS: 500.0000 mg | ORAL_TABLET | Freq: Two times a day (BID) | ORAL | 1 refills | Status: DC

## 2016-07-30 ENCOUNTER — Encounter: Payer: Self-pay | Admitting: Physician Assistant

## 2016-07-30 ENCOUNTER — Ambulatory Visit (INDEPENDENT_AMBULATORY_CARE_PROVIDER_SITE_OTHER): Payer: 59 | Admitting: Physician Assistant

## 2016-07-30 VITALS — BP 128/74 | HR 81 | Temp 97.3°F | Resp 14 | Ht 62.5 in | Wt 164.0 lb

## 2016-07-30 DIAGNOSIS — E559 Vitamin D deficiency, unspecified: Secondary | ICD-10-CM

## 2016-07-30 DIAGNOSIS — G4700 Insomnia, unspecified: Secondary | ICD-10-CM | POA: Diagnosis not present

## 2016-07-30 DIAGNOSIS — Z79899 Other long term (current) drug therapy: Secondary | ICD-10-CM

## 2016-07-30 DIAGNOSIS — I10 Essential (primary) hypertension: Secondary | ICD-10-CM

## 2016-07-30 DIAGNOSIS — E785 Hyperlipidemia, unspecified: Secondary | ICD-10-CM

## 2016-07-30 DIAGNOSIS — E876 Hypokalemia: Secondary | ICD-10-CM

## 2016-07-30 DIAGNOSIS — D649 Anemia, unspecified: Secondary | ICD-10-CM | POA: Diagnosis not present

## 2016-07-30 MED ORDER — PHENTERMINE HCL 37.5 MG PO TABS: 37.5000 mg | ORAL_TABLET | Freq: Every day | ORAL | 2 refills | Status: DC

## 2016-07-30 MED ORDER — TRAZODONE HCL 50 MG PO TABS: ORAL_TABLET | ORAL | 2 refills | Status: DC

## 2016-07-30 NOTE — Patient Instructions (Addendum)
Trazodone 50mg  Start 1/2-1 pill with one of the xanax at night Can go up to a total of 150mg  night of the trazodone if needed Our goal is to taper you off the xanax  Phentermine  While taking the medication we may ask that you come into the office once a month or once every 2-3 months to monitor your weight, blood pressure, and heart rate. In addition we can help answer your questions about diet, exercise, and help you every step of the way with your weight loss journey. Sometime it is helpful if you bring in a food diary or use an app on your phone such as myfitnesspal to record your calorie intake, especially in the beginning.   You can start out on 1/3 to 1/2 a pill in the morning and if you are tolerating it well you can increase to one pill daily. I also have some patients that take 1/3 or 1/2 at lunch to help prevent night time eating.  This medication is cheapest CASH pay at [place] is 14-17 dollars and you do NOT need a membership to get meds from there.    What is this medicine? PHENTERMINE (FEN ter meen) decreases your appetite. This medicine is intended to be used in addition to a healthy reduced calorie diet and exercise. The best results are achieved this way. This medicine is only indicated for short-term use. Eventually your weight loss may level out and the medication will no longer be needed.   How should I use this medicine? Take this medicine by mouth. Follow the directions on the prescription label. The tablets should stay in the bottle until immediately before you take your dose. Take your doses at regular intervals. Do not take your medicine more often than directed.  Overdosage: If you think you have taken too much of this medicine contact a poison control center or emergency room at once. NOTE: This medicine is only for you. Do not share this medicine with others.  What if I miss a dose? If you miss a dose, take it as soon as you can. If it is  almost time for your next dose, take only that dose. Do not take double or extra doses. Do not increase or in any way change your dose without consulting your doctor.  What should I watch for while using this medicine? Notify your physician immediately if you become short of breath while doing your normal activities. Do not take this medicine within 6 hours of bedtime. It can keep you from getting to sleep. Avoid drinks that contain caffeine and try to stick to a regular bedtime every night. Do not stand or sit up quickly, especially if you are an older patient. This reduces the risk of dizzy or fainting spells. Avoid alcoholic drinks.  What side effects may I notice from receiving this medicine? Side effects that you should report to your doctor or health care professional as soon as possible: -chest pain, palpitations -depression or severe changes in mood -increased blood pressure.info -irritability -nervousness or restlessness -severe dizziness -shortness of breath -problems urinating -unusual swelling of the legs -vomiting  Side effects that usually do not require medical attention (report to your doctor or health care professional if they continue or are bothersome): -blurred vision or other eye problems -changes in sexual ability or desire -constipation or diarrhea -difficulty sleeping -dry mouth or unpleasant taste -headache -nausea This list may not describe all possible side effects. Call your doctor  for medical advice about side effects. You may report side effects to FDA at 1-800-FDA-1088.    Simple math prevails.    1st - exercise does not produce significant weight loss - at best one converts fat into muscle , "bulks up", loses inches, but usually stays "weight neutral"     2nd - think of your body weightas a check book: If you eat more calories than you burn up - you save money or gain weight .... Or if you spend more money than you put in the check book, ie burn up  more calories than you eat, then you lose weight     3rd - if you walk or run 1 mile, you burn up 100 calories - you have to burn up 3,500 calories to lose 1 pound, ie you have to walk/run 35 miles to lose 1 measly pound. So if you want to lose 10 #, then you have to walk/run 350 miles, so.... clearly exercise is not the solution.     4. So if you consume 1,500 calories, then you have to burn up the equivalent of 15 miles to stay weight neutral - It also stands to reason that if you consume 1,500 cal/day and don't lose weight, then you must be burning up about 1,500 cals/day to stay weight neutral.     5. If you really want to lose weight, you must cut your calorie intake 300 calories /day and at that rate you should lose about 1 # every 3 days.   6. Please purchase Dr Fara Olden Fuhrman's book(s) "The End of Dieting" & "Eat to Live" . It has some great concepts and recipes.

## 2016-07-30 NOTE — Progress Notes (Signed)
Assessment and Plan:  Essential hypertension - continue medications, DASH diet, exercise and monitor at home. Call if greater than 130/80.   Morbid Obesity with co morbidities - long discussion about weight loss, diet, and exercise -     phentermine (ADIPEX-P) 37.5 MG tablet; Take 1 tablet (37.5 mg total) by mouth daily before breakfast. - follow up 4-6 weeks  Hyperlipidemia, unspecified hyperlipidemia type -continue medications, check lipids, decrease fatty foods, increase activity. '  Anemia, unspecified type - monitor, continue iron supp with Vitamin C and increase green leafy veggies  Medication management  Vitamin D deficiency  Insomnia, unspecified type -     traZODone (DESYREL) 50 MG tablet; 1/2-1 tablet for sleep - will try to decrease xanax use at night, to use as needed  Will get labs next OV Continue diet and meds as discussed. Further disposition pending results of labs. Over 30 minutes of exam, counseling, chart review, and critical decision making was performed Future Appointments Date Time Provider [place]  07/30/2016 8:45 AM Vicie Mutters, PA-C [facility] None  01/14/2017 9:00 AM Vicie Mutters, PA-C [facility] None    HPI 56 y.o. female  presents for 3 month follow up on hypertension, cholesterol, prediabetes, and vitamin D deficiency.   Her blood pressure has been controlled at home, she is on atenolol at night 50mg , today their BP is BP: 128/74  Taking care of her dad after her mom passed August, he is 82, can not drive, trying to keep him at home. She has increased from 1 to 2 xanax at night for sleep after mom passed, will try different sleep medication to try to decrease need.   She does workout. She denies chest pain, shortness of breath, dizziness.  She is not on cholesterol medication and denies myalgias. Her cholesterol is at goal. The cholesterol last visit was:   Lab Results  Component Value Date   CHOL 182 12/12/2015   HDL 73 12/12/2015    LDLCALC 91 12/12/2015   TRIG 92 12/12/2015   CHOLHDL 2.5 12/12/2015   Last A1C in the office was:  Lab Results  Component Value Date   HGBA1C 5.3 11/29/2013   Patient is on Vitamin D supplement.   Lab Results  Component Value Date   VD25OH 83 12/12/2015     BMI is Body mass index is 29.52 kg/m., she is working on diet and exercise. She is struggling with her weight and wishing to try phentermine or contrave.  Wt Readings from Last 3 Encounters:  07/30/16 164 lb (74.4 kg)  05/06/16 163 lb (73.9 kg)  03/20/16 164 lb 3.2 oz (74.5 kg)   Patient is on allopurinol for gout and does not report a recent flare.  Lab Results  Component Value Date   LABURIC 2.8 09/20/2015    Current Medications:  Current Outpatient Prescriptions on File Prior to Visit  Medication Sig Dispense Refill  . allopurinol (ZYLOPRIM) 300 MG tablet TAKE 1 TABLET BY MOUTH  DAILY 90 tablet 0  . ALPRAZolam (XANAX) 0.5 MG tablet Take 1 tablet (0.5 mg total) by mouth 3 (three) times daily as needed for anxiety. 270 tablet 0  . Ascorbic Acid (VITAMIN C PO) Take by mouth daily.    Marland Kitchen atenolol (TENORMIN) 100 MG tablet Take 1 tablet by mouth  daily 90 tablet 1  . buPROPion (WELLBUTRIN XL) 150 MG 24 hr tablet Take 2 tablets (300 mg total) by mouth every morning. 180 tablet 1  . cholecalciferol (VITAMIN D) 1000 units tablet  Take 1,000 Units by mouth daily.    . furosemide (LASIX) 40 MG tablet Take 1 tablet (40 mg total) by mouth daily. 90 tablet 3  . MAGNESIUM PO Take 400 mg by mouth daily.     Marland Kitchen MARLISSA 0.15-30 MG-MCG tablet TAKE 1 TABLET BY MOUTH  DAILY 84 tablet 1  . milk thistle 175 MG tablet Take 175 mg by mouth daily.    Marland Kitchen MITIGARE 0.6 MG CAPS TAKE 1 CAPSULE EVERY DAY 90 capsule 1  . montelukast (SINGULAIR) 10 MG tablet Take 1 tablet (10 mg total) by mouth at bedtime. 90 tablet 1  . Omega-3 Fatty Acids (FISH OIL) 1000 MG CAPS Take 1,000 mg by mouth daily.    Marland Kitchen omeprazole (PRILOSEC) 20 MG capsule Take 20 mg by  mouth daily.     . traMADol (ULTRAM) 50 MG tablet Take 1 tablet (50 mg total) by mouth every 6 (six) hours as needed. for pain 90 tablet 0  . valACYclovir (VALTREX) 500 MG tablet Take 1 tablet (500 mg total) by mouth 2 (two) times daily. 60 tablet 1  . valsartan (DIOVAN) 320 MG tablet Take 1 tablet (320 mg total) by mouth daily. 90 tablet 1   No current facility-administered medications on file prior to visit.    Medical History:  Past Medical History:  Diagnosis Date  . Allergy   . Anemia   . Anxiety   . Asthma   . Hypertension    Allergies:  Allergies  Allergen Reactions  . Ciprofloxacin Nausea Only    Headache  . Penicillins     Allergy has been more than 65yrs but unsure of what reaction was    Review of Systems:  Review of Systems  Constitutional: Negative.   HENT: Negative.   Eyes: Negative.   Respiratory: Negative.   Cardiovascular: Negative.   Gastrointestinal: Negative.   Genitourinary: Negative.   Musculoskeletal: Positive for back pain. Negative for falls, joint pain, myalgias and neck pain.  Skin: Negative.   Neurological: Negative.   Endo/Heme/Allergies: Negative.   Psychiatric/Behavioral: Negative for depression, hallucinations, memory loss, substance abuse and suicidal ideas. The patient is nervous/anxious. The patient does not have insomnia.     Family history- Review and unchanged Social history- Review and unchanged Physical Exam: BP 128/74   Pulse 81   Temp 97.3 F (36.3 C)   Resp 14   Ht 5' 2.5" (1.588 m)   Wt 164 lb (74.4 kg)   LMP 06/29/2016   SpO2 97%   BMI 29.52 kg/m  Wt Readings from Last 3 Encounters:  07/30/16 164 lb (74.4 kg)  05/06/16 163 lb (73.9 kg)  03/20/16 164 lb 3.2 oz (74.5 kg)   General Appearance: Well nourished, in no apparent distress. Eyes: PERRLA, EOMs, conjunctiva no swelling or erythema Sinuses: No Frontal/maxillary tenderness ENT/Mouth: Ext aud canals clear, TMs without erythema, bulging. No erythema, swelling,  or exudate on post pharynx.  Tonsils not swollen or erythematous. Hearing normal.  Neck: Supple, thyroid normal.  Respiratory: Respiratory effort normal, BS equal bilaterally without rales, rhonchi, wheezing or stridor.  Cardio: RRR with no MRGs. Brisk peripheral pulses without edema.  Abdomen: Soft, + BS,  Non tender, no guarding, rebound, hernias, masses. Lymphatics: Non tender without lymphadenopathy.  Musculoskeletal: Full ROM, 5/5 strength, Normal gait Skin: Warm, dry without rashes, lesions, ecchymosis.  Neuro: Cranial nerves intact. Normal muscle tone, no cerebellar symptoms. Psych: Awake and oriented X 3, normal affect, Insight and Judgment appropriate.    Vicie Mutters,  PA-C 8:41 AM  [facility]

## 2016-07-31 ENCOUNTER — Other Ambulatory Visit: Payer: Self-pay | Admitting: Internal Medicine

## 2016-08-18 ENCOUNTER — Other Ambulatory Visit: Payer: Self-pay | Admitting: Internal Medicine

## 2016-08-29 ENCOUNTER — Other Ambulatory Visit: Payer: Self-pay | Admitting: Physician Assistant

## 2016-09-01 ENCOUNTER — Ambulatory Visit: Payer: Self-pay | Admitting: Physician Assistant

## 2016-09-17 ENCOUNTER — Encounter: Payer: Self-pay | Admitting: Physician Assistant

## 2016-09-17 ENCOUNTER — Ambulatory Visit (INDEPENDENT_AMBULATORY_CARE_PROVIDER_SITE_OTHER): Payer: 59 | Admitting: Physician Assistant

## 2016-09-17 VITALS — BP 120/80 | HR 68 | Temp 97.5°F | Resp 16 | Ht 62.5 in | Wt 155.6 lb

## 2016-09-17 DIAGNOSIS — I10 Essential (primary) hypertension: Secondary | ICD-10-CM | POA: Diagnosis not present

## 2016-09-17 DIAGNOSIS — D649 Anemia, unspecified: Secondary | ICD-10-CM

## 2016-09-17 DIAGNOSIS — E785 Hyperlipidemia, unspecified: Secondary | ICD-10-CM

## 2016-09-17 DIAGNOSIS — E559 Vitamin D deficiency, unspecified: Secondary | ICD-10-CM | POA: Diagnosis not present

## 2016-09-17 DIAGNOSIS — Z79899 Other long term (current) drug therapy: Secondary | ICD-10-CM | POA: Diagnosis not present

## 2016-09-17 LAB — CBC WITH DIFFERENTIAL/PLATELET
BASOS ABS: 118 {cells}/uL (ref 0–200)
BASOS PCT: 1 %
EOS ABS: 236 {cells}/uL (ref 15–500)
Eosinophils Relative: 2 %
HCT: 43 % (ref 35.0–45.0)
HEMOGLOBIN: 14.4 g/dL (ref 11.7–15.5)
LYMPHS PCT: 19 %
Lymphs Abs: 2242 cells/uL (ref 850–3900)
MCH: 34 pg — ABNORMAL HIGH (ref 27.0–33.0)
MCHC: 33.5 g/dL (ref 32.0–36.0)
MCV: 101.7 fL — AB (ref 80.0–100.0)
MONO ABS: 826 {cells}/uL (ref 200–950)
MONOS PCT: 7 %
MPV: 10.3 fL (ref 7.5–12.5)
NEUTROS PCT: 71 %
Neutro Abs: 8378 cells/uL — ABNORMAL HIGH (ref 1500–7800)
PLATELETS: 371 10*3/uL (ref 140–400)
RBC: 4.23 MIL/uL (ref 3.80–5.10)
RDW: 13.8 % (ref 11.0–15.0)
WBC: 11.8 10*3/uL — ABNORMAL HIGH (ref 3.8–10.8)

## 2016-09-17 LAB — HEPATIC FUNCTION PANEL
ALBUMIN: 4.7 g/dL (ref 3.6–5.1)
ALK PHOS: 58 U/L (ref 33–130)
ALT: 66 U/L — ABNORMAL HIGH (ref 6–29)
AST: 67 U/L — AB (ref 10–35)
BILIRUBIN DIRECT: 0.1 mg/dL (ref <=0.2)
BILIRUBIN TOTAL: 0.4 mg/dL (ref 0.2–1.2)
Indirect Bilirubin: 0.3 mg/dL (ref 0.2–1.2)
Total Protein: 7.3 g/dL (ref 6.1–8.1)

## 2016-09-17 LAB — LIPID PANEL
CHOL/HDL RATIO: 2.8 ratio (ref <5.0)
Cholesterol: 218 mg/dL — ABNORMAL HIGH (ref <200)
HDL: 77 mg/dL (ref >50)
LDL CALC: 119 mg/dL — AB (ref <100)
Triglycerides: 109 mg/dL (ref <150)
VLDL: 22 mg/dL (ref <30)

## 2016-09-17 LAB — MAGNESIUM: Magnesium: 1.9 mg/dL (ref 1.5–2.5)

## 2016-09-17 LAB — BASIC METABOLIC PANEL WITH GFR
BUN: 10 mg/dL (ref 7–25)
CHLORIDE: 98 mmol/L (ref 98–110)
CO2: 30 mmol/L (ref 20–31)
Calcium: 10 mg/dL (ref 8.6–10.4)
Creat: 0.83 mg/dL (ref 0.50–1.05)
GFR, Est African American: >89 mL/min (ref >=60)
GFR, Est Non African American: 79 mL/min (ref >=60)
Glucose, Bld: 83 mg/dL (ref 65–99)
POTASSIUM: 3.5 mmol/L (ref 3.5–5.3)
Sodium: 141 mmol/L (ref 135–146)

## 2016-09-17 LAB — IRON AND TIBC
%SAT: 29 % (ref 11–50)
Iron: 106 ug/dL (ref 45–160)
TIBC: 361 ug/dL (ref 250–450)
UIBC: 255 ug/dL (ref 125–400)

## 2016-09-17 LAB — FERRITIN: Ferritin: 252 ng/mL — ABNORMAL HIGH (ref 10–232)

## 2016-09-17 LAB — TSH: TSH: 1.37 mIU/L

## 2016-09-17 MED ORDER — TRAZODONE HCL 50 MG PO TABS: ORAL_TABLET | ORAL | 2 refills | Status: DC

## 2016-09-17 MED ORDER — PHENTERMINE HCL 37.5 MG PO TABS: 37.5000 mg | ORAL_TABLET | Freq: Every day | ORAL | 2 refills | Status: DC

## 2016-09-17 NOTE — Progress Notes (Signed)
Assessment and Plan:  Essential hypertension - continue medications, DASH diet, exercise and monitor at home. Call if greater than 130/80.   Morbid Obesity with co morbidities - long discussion about weight loss, diet, and exercise -     phentermine (ADIPEX-P) 37.5 MG tablet; Take 1 tablet (37.5 mg total) by mouth daily before breakfast. - follow up at CPE  Hyperlipidemia, unspecified hyperlipidemia type -continue medications, check lipids, decrease fatty foods, increase activity. '  Anemia, unspecified type - monitor, continue iron supp with Vitamin C and increase green leafy veggies - check iron/ferritin, continue B12  Insomnia, unspecified type -     traZODone (DESYREL) 50 MG tablet; 1/2-1 tablet for sleep sent to [facility] - continue xanax PRN  Continue diet and meds as discussed. Further disposition pending results of labs. Over 30 minutes of exam, counseling, chart review, and critical decision making was performed Future Appointments Date Time Provider [place]  01/14/2017 9:00 AM Vicie Mutters, PA-C [facility] None    HPI 56 y.o. female  presents for 3 month follow up on hypertension, cholesterol, prediabetes, and vitamin D deficiency.   Her blood pressure has been controlled at home, she is on atenolol at night 50mg , today their BP is BP: 120/80  Taking care of her dad after her mom passed August, he is 62, can not drive, trying to keep him at home. Shew as given trazodone last visit to try to decrease xanax use at night. She states she is on the 51 and doing very well.   She does workout. She denies chest pain, shortness of breath, dizziness.  She is not on cholesterol medication and denies myalgias. Her cholesterol is at goal. The cholesterol last visit was:   Lab Results  Component Value Date   CHOL 182 12/12/2015   HDL 73 12/12/2015   LDLCALC 91 12/12/2015   TRIG 92 12/12/2015   CHOLHDL 2.5 12/12/2015   Last A1C in the office was:  Lab Results  Component  Value Date   HGBA1C 5.3 11/29/2013   Patient is on Vitamin D supplement.   Lab Results  Component Value Date   VD25OH 83 12/12/2015     BMI is Body mass index is 28.01 kg/m., she is working on diet and exercise. She is struggling with her weight, given phentermine lats visit and is down close to 10 lbs.  Wt Readings from Last 3 Encounters:  09/17/16 155 lb 9.6 oz (70.6 kg)  07/30/16 164 lb (74.4 kg)  05/06/16 163 lb (73.9 kg)    Current Medications:  Current Outpatient Prescriptions on File Prior to Visit  Medication Sig Dispense Refill  . allopurinol (ZYLOPRIM) 300 MG tablet TAKE 1 TABLET BY MOUTH  DAILY 90 tablet 1  . ALPRAZolam (XANAX) 0.5 MG tablet Take 1 tablet (0.5 mg total) by mouth 3 (three) times daily as needed for anxiety. 270 tablet 0  . Ascorbic Acid (VITAMIN C PO) Take by mouth daily.    Marland Kitchen atenolol (TENORMIN) 100 MG tablet TAKE 1 TABLET BY MOUTH  DAILY 90 tablet 1  . buPROPion (WELLBUTRIN XL) 150 MG 24 hr tablet Take 2 tablets (300 mg total) by mouth every morning. 180 tablet 1  . cholecalciferol (VITAMIN D) 1000 units tablet Take 1,000 Units by mouth daily.    . furosemide (LASIX) 40 MG tablet Take 1 tablet (40 mg total) by mouth daily. 90 tablet 3  . MAGNESIUM PO Take 400 mg by mouth daily.     Marland Kitchen MARLISSA 0.15-30 MG-MCG  tablet TAKE 1 TABLET BY MOUTH  DAILY 84 tablet 3  . milk thistle 175 MG tablet Take 175 mg by mouth daily.    Marland Kitchen MITIGARE 0.6 MG CAPS TAKE 1 CAPSULE EVERY DAY 90 capsule 1  . montelukast (SINGULAIR) 10 MG tablet Take 1 tablet (10 mg total) by mouth at bedtime. 90 tablet 1  . Omega-3 Fatty Acids (FISH OIL) 1000 MG CAPS Take 1,000 mg by mouth daily.    Marland Kitchen omeprazole (PRILOSEC) 20 MG capsule Take 20 mg by mouth daily.     . phentermine (ADIPEX-P) 37.5 MG tablet Take 1 tablet (37.5 mg total) by mouth daily before breakfast. 30 tablet 2  . traMADol (ULTRAM) 50 MG tablet Take 1 tablet (50 mg total) by mouth every 6 (six) hours as needed. for pain 90 tablet 0   . traZODone (DESYREL) 50 MG tablet 1/2-1 tablet for sleep 30 tablet 2  . valACYclovir (VALTREX) 500 MG tablet Take 1 tablet (500 mg total) by mouth 2 (two) times daily. 60 tablet 1  . valsartan (DIOVAN) 320 MG tablet Take 1 tablet (320 mg total) by mouth daily. 90 tablet 1   No current facility-administered medications on file prior to visit.    Medical History:  Past Medical History:  Diagnosis Date  . Allergy   . Anemia   . Anxiety   . Asthma   . Hypertension    Allergies:  Allergies  Allergen Reactions  . Ciprofloxacin Nausea Only    Headache  . Penicillins     Allergy has been more than 97yrs but unsure of what reaction was    Review of Systems:  Review of Systems  Constitutional: Negative.   HENT: Negative.   Eyes: Negative.   Respiratory: Negative.   Cardiovascular: Negative.   Gastrointestinal: Negative.   Genitourinary: Negative.   Musculoskeletal: Positive for back pain. Negative for falls, joint pain, myalgias and neck pain.  Skin: Negative.   Neurological: Negative.   Endo/Heme/Allergies: Negative.   Psychiatric/Behavioral: Negative for depression, hallucinations, memory loss, substance abuse and suicidal ideas. The patient is nervous/anxious. The patient does not have insomnia.     Family history- Review and unchanged Social history- Review and unchanged Physical Exam: BP 120/80   Pulse 68   Temp 97.5 F (36.4 C)   Resp 16   Ht 5' 2.5" (1.588 m)   Wt 155 lb 9.6 oz (70.6 kg)   LMP 09/17/2016   SpO2 95%   BMI 28.01 kg/m  Wt Readings from Last 3 Encounters:  09/17/16 155 lb 9.6 oz (70.6 kg)  07/30/16 164 lb (74.4 kg)  05/06/16 163 lb (73.9 kg)   General Appearance: Well nourished, in no apparent distress. Eyes: PERRLA, EOMs, conjunctiva no swelling or erythema Sinuses: No Frontal/maxillary tenderness ENT/Mouth: Ext aud canals clear, TMs without erythema, bulging. No erythema, swelling, or exudate on post pharynx.  Tonsils not swollen or  erythematous. Hearing normal.  Neck: Supple, thyroid normal.  Respiratory: Respiratory effort normal, BS equal bilaterally without rales, rhonchi, wheezing or stridor.  Cardio: RRR with no MRGs. Brisk peripheral pulses without edema.  Abdomen: Soft, + BS,  Non tender, no guarding, rebound, hernias, masses. Lymphatics: Non tender without lymphadenopathy.  Musculoskeletal: Full ROM, 5/5 strength, Normal gait Skin: Warm, dry without rashes, lesions, ecchymosis.  Neuro: Cranial nerves intact. Normal muscle tone, no cerebellar symptoms. Psych: Awake and oriented X 3, normal affect, Insight and Judgment appropriate.    Vicie Mutters, PA-C 8:46 AM [facility]

## 2016-09-17 NOTE — Patient Instructions (Signed)
Simple math prevails.    1st - exercise does not produce significant weight loss - at best one converts fat into muscle , "bulks up", loses inches, but usually stays "weight neutral"     2nd - think of your body weightas a check book: If you eat more calories than you burn up - you save money or gain weight .... Or if you spend more money than you put in the check book, ie burn up more calories than you eat, then you lose weight     3rd - if you walk or run 1 mile, you burn up 100 calories - you have to burn up 3,500 calories to lose 1 pound, ie you have to walk/run 35 miles to lose 1 measly pound. So if you want to lose 10 #, then you have to walk/run 350 miles, so.... clearly exercise is not the solution.     4. So if you consume 1,500 calories, then you have to burn up the equivalent of 15 miles to stay weight neutral - It also stands to reason that if you consume 1,500 cal/day and don't lose weight, then you must be burning up about 1,500 cals/day to stay weight neutral.     5. If you really want to lose weight, you must cut your calorie intake 300 calories /day and at that rate you should lose about 1 # every 3 days.   6. Please purchase Dr Joel Fuhrman's book(s) "The End of Dieting" & "Eat to Live" . It has some great concepts and recipes.      Before you even begin to attack a weight-loss plan, it pays to remember this: You are not fat. You have fat. Losing weight isn't about blame or shame; it's simply another achievement to accomplish. Dieting is like any other skill-you have to buckle down and work at it. As long as you act in a smart, reasonable way, you'll ultimately get where you want to be. Here are some weight loss pearls for you.  1. It's Not a Diet. It's a Lifestyle Thinking of a diet as something you're on and suffering through only for the short term doesn't work. To shed weight and keep it off, you need to make permanent changes to the way you eat. It's OK to indulge  occasionally, of course, but if you cut calories temporarily and then revert to your old way of eating, you'll gain back the weight quicker than you can say yo-yo. Use it to lose it. Research shows that one of the best predictors of long-term weight loss is how many pounds you drop in the first month. For that reason, nutritionists often suggest being stricter for the first two weeks of your new eating strategy to build momentum. Cut out added sugar and alcohol and avoid unrefined carbs. After that, figure out how you can reincorporate them in a way that's healthy and maintainable.  2. There's a Right Way to Exercise Working out burns calories and fat and boosts your metabolism by building muscle. But those trying to lose weight are notorious for overestimating the number of calories they burn and underestimating the amount they take in. Unfortunately, your system is biologically programmed to hold on to extra pounds and that means when you start exercising, your body senses the deficit and ramps up its hunger signals. If you're not diligent, you'll eat everything you burn and then some. Use it to lose it. Cardio gets all the exercise glory, but strength and interval   training are the real heroes. They help you build lean muscle, which in turn increases your metabolism and calorie-burning ability 3. Don't Overreact to Mild Hunger Some people have a hard time losing weight because of hunger anxiety. To them, being hungry is bad-something to be avoided at all costs-so they carry snacks with them and eat when they don't need to. Others eat because they're stressed out or bored. While you never want to get to the point of being ravenous (that's when bingeing is likely to happen), a hunger pang, a craving, or the fact that it's 3:00 p.m. should not send you racing for the vending machine or obsessing about the energy bar in your purse. Ideally, you should put off eating until your stomach is growling and it's  difficult to concentrate.  Use it to lose it. When you feel the urge to eat, use the HALT method. Ask yourself, Am I really hungry? Or am I angry or anxious, lonely or bored, or tired? If you're still not certain, try the apple test. If you're truly hungry, an apple should seem delicious; if it doesn't, something else is going on. Or you can try drinking water and making yourself busy, if you are still hungry try a healthy snack.  4. Not All Calories Are Created Equal The mechanics of weight loss are pretty simple: Take in fewer calories than you use for energy. But the kind of food you eat makes all the difference. Processed food that's high in saturated fat and refined starch or sugar can cause inflammation that disrupts the hormone signals that tell your brain you're full. The result: You eat a lot more.  Use it to lose it. Clean up your diet. Swap in whole, unprocessed foods, including vegetables, lean protein, and healthy fats that will fill you up and give you the biggest nutritional bang for your calorie buck. In a few weeks, as your brain starts receiving regular hunger and fullness signals once again, you'll notice that you feel less hungry overall and naturally start cutting back on the amount you eat.  5. Protein, Produce, and Plant-Based Fats Are Your Weight-Loss Trinity Here's why eating the three Ps regularly will help you drop pounds. Protein fills you up. You need it to build lean muscle, which keeps your metabolism humming so that you can torch more fat. People in a weight-loss program who ate double the recommended daily allowance for protein (about 110 grams for a 150-pound woman) lost 70 percent of their weight from fat, while people who ate the RDA lost only about 40 percent, one study found. Produce is packed with filling fiber. "It's very difficult to consume too many calories if you're eating a lot of vegetables. Example: Three cups of broccoli is a lot of food, yet only 93 calories.  (Fruit is another story. It can be easy to overeat and can contain a lot of calories from sugar, so be sure to monitor your intake.) Plant-based fats like olive oil and those in avocados and nuts are healthy and extra satiating.  Use it to lose it. Aim to incorporate each of the three Ps into every meal and snack. People who eat protein throughout the day are able to keep weight off, according to a study in the American Journal of Clinical Nutrition. In addition to meat, poultry and seafood, good sources are beans, lentils, eggs, tofu, and yogurt. As for fat, keep portion sizes in check by measuring out salad dressing, oil, and nut butters (shoot   for one to two tablespoons). Finally, eat veggies or a little fruit at every meal. People who did that consumed 308 fewer calories but didn't feel any hungrier than when they didn't eat more produce.  7. How You Eat Is As Important As What You Eat In order for your brain to register that you're full, you need to focus on what you're eating. Sit down whenever you eat, preferably at a table. Turn off the TV or computer, put down your phone, and look at your food. Smell it. Chew slowly, and don't put another bite on your fork until you swallow. When women ate lunch this attentively, they consumed 30 percent less when snacking later than those who listened to an audiobook at lunchtime, according to a study in the Day of Nutrition. 8. Weighing Yourself Really Works The scale provides the best evidence about whether your efforts are paying off. Seeing the numbers tick up or down or stagnate is motivation to keep going-or to rethink your approach. A 2015 study at [facility] found that daily weigh-ins helped people lose more weight, keep it off, and maintain that loss, even after two years. Use it to lose it. Step on the scale at the same time every day for the best results. If your weight shoots up several pounds from one weigh-in to the next, don't  freak out. Eating a lot of salt the night before or having your period is the likely culprit. The number should return to normal in a day or two. It's a steady climb that you need to do something about. 9. Too Much Stress and Too Little Sleep Are Your Enemies When you're tired and frazzled, your body cranks up the production of cortisol, the stress hormone that can cause carb cravings. Not getting enough sleep also boosts your levels of ghrelin, a hormone associated with hunger, while suppressing leptin, a hormone that signals fullness and satiety. People on a diet who slept only five and a half hours a night for two weeks lost 55 percent less fat and were hungrier than those who slept eight and a half hours, according to a study in the [place]. Use it to lose it. Prioritize sleep, aiming for seven hours or more a night, which research shows helps lower stress. And make sure you're getting quality zzz's. If a snoring spouse or a fidgety cat wakes you up frequently throughout the night, you may end up getting the equivalent of just four hours of sleep, according to a study from [facility]. Keep pets out of the bedroom, and use a white-noise app to drown out snoring. 10. You Will Hit a plateau-And You Can Bust Through It As you slim down, your body releases much less leptin, the fullness hormone.  If you're not strength training, start right now. Building muscle can raise your metabolism to help you overcome a plateau. To keep your body challenged and burning calories, incorporate new moves and more intense intervals into your workouts or add another sweat session to your weekly routine. Alternatively, cut an extra 100 calories or so a day from your diet. Now that you've lost weight, your body simply doesn't need as much fuel.   Ways to cut 100 calories  1. Eat your eggs with hot sauce OR salsa instead of cheese.  Eggs are great for breakfast, but many people consider  eggs and cheese to be BFFs. Instead of cheese-1 oz. of cheddar has 114 calories-top your eggs with  hot sauce, which contains no calories and helps with satiety and metabolism. Salsa is also a great option!!  2. Top your toast, waffles or pancakes with mashed berries instead of jelly or syrup. Half a cup of berries-fresh, frozen or thawed-has about 40 calories, compared with 2 tbsp. of maple syrup or jelly, which both have about 100 calories. The berries will also give you a good punch of fiber, which helps keep you full and satisfied and won't spike blood sugar quickly like the jelly or syrup. 3. Swap the non-fat latte for black coffee with a splash of half-and-half. Contrary to its name, that non-fat latte has 130 calories and a startling 19g of carbohydrates per 16 oz. serving. Replacing that 'light' drinkable dessert with a black coffee with a splash of half-and-half saves you more than 100 calories per 16 oz. serving. 4. Sprinkle salads with freeze-dried raspberries instead of dried cranberries. If you want a sweet addition to your nutritious salad, stay away from dried cranberries. They have a whopping 130 calories per  cup and 30g carbohydrates. Instead, sprinkle freeze-dried raspberries guilt-free and save more than 100 calories per  cup serving, adding 3g of belly-filling fiber. 5. Go for mustard in place of mayo on your sandwich. Mustard can add really nice flavor to any sandwich, and there are tons of varieties, from spicy to honey. A serving of mayo is 95 calories, versus 10 calories in a serving of mustard. 6. Choose a DIY salad dressing instead of the store-bought kind. Mix Dijon or whole grain mustard with low-fat Kefir or red wine vinegar and garlic. 7. Use hummus as a spread instead of a dip. Use hummus as a spread on a high-fiber cracker or tortilla with a sandwich and save on calories without sacrificing taste. 8. Pick just one salad "accessory." Salad isn't automatically a calorie  winner. It's easy to over-accessorize with toppings. Instead of topping your salad with nuts, avocado and cranberries (all three will clock in at 313 calories), just pick one. The next day, choose a different accessory, which will also keep your salad interesting. You don't wear all your jewelry every day, right? 9. Ditch the white pasta in favor of spaghetti squash. One cup of cooked spaghetti squash has about 40 calories, compared with traditional spaghetti, which comes with more than 200. Spaghetti squash is also nutrient-dense. It's a good source of fiber and Vitamins A and C, and it can be eaten just like you would eat pasta-with a great tomato sauce and [place] meatballs or with pesto, tofu and spinach, for example. 10. Dress up your chili, soups and stews with non-fat [place] yogurt instead of sour cream. Just a 'dollop' of sour cream can set you back 115 calories and a whopping 12g of fat-seven of which are of the artery-clogging variety. Added bonus: [place] yogurt is packed with muscle-building protein, calcium and B Vitamins. 11. Mash cauliflower instead of mashed potatoes. One cup of traditional mashed potatoes-in all their creamy goodness-has more than 200 calories, compared to mashed cauliflower, which you can typically eat for less than 100 calories per 1 cup serving. Cauliflower is a great source of the antioxidant indole-3-carbinol (I3C), which may help reduce the risk of some cancers, like breast cancer. 12. Ditch the ice cream sundae in favor of a [place] yogurt parfait. Instead of a cup of ice cream or fro-yo for dessert, try 1 cup of nonfat Greek yogurt topped with fresh berries and a sprinkle of cacao nibs. Both toppings are packed  with antioxidants, which can help reduce cellular inflammation and oxidative damage. And the comparison is a no-brainer: One cup of ice cream has about 275 calories; one cup of frozen yogurt has about 230; and a cup of Greek yogurt has just 130, plus twice the  protein, so you're less likely to return to the freezer for a second helping. 13. Put olive oil in a spray container instead of using it directly from the bottle. Each tablespoon of olive oil is 120 calories and 15g of fat. Use a mister instead of pouring it straight into the pan or onto a salad. This allows for portion control and will save you more than 100 calories. 14. When baking, substitute canned pumpkin for butter or oil. Canned pumpkin-not pumpkin pie mix-is loaded with Vitamin A, which is important for skin and eye health, as well as immunity. And the comparisons are pretty crazy:  cup of canned pumpkin has about 40 calories, compared to butter or oil, which has more than 800 calories. Yes, 800 calories. Applesauce and mashed banana can also serve as good substitutions for butter or oil, usually in a 1:1 ratio. 15. Top casseroles with high-fiber cereal instead of breadcrumbs. Breadcrumbs are typically made with white bread, while breakfast cereals contain 5-9g of fiber per serving. Not only will you save more than 150 calories per  cup serving, the swap will also keep you more full and you'll get a metabolism boost from the added fiber. 16. Snack on pistachios instead of macadamia nuts. Believe it or not, you get the same amount of calories from 35 pistachios (100 calories) as you would from only five macadamia nuts. 17. Chow down on kale chips rather than potato chips. This is my favorite 'don't knock it 'till you try it' swap. Kale chips are so easy to make at home, and you can spice them up with a little grated parmesan or chili powder. Plus, they're a mere fraction of the calories of potato chips, but with the same crunch factor we crave so often. 18. Add seltzer and some fruit slices to your cocktail instead of soda or fruit juice. One cup of soda or fruit juice can pack on as much as 140 calories. Instead, use seltzer and fruit slices. The fruit provides valuable phytochemicals, such as  flavonoids and anthocyanins, which help to combat cancer and stave off the aging process.

## 2016-09-18 ENCOUNTER — Other Ambulatory Visit: Payer: Self-pay | Admitting: Physician Assistant

## 2016-09-18 DIAGNOSIS — R7989 Other specified abnormal findings of blood chemistry: Secondary | ICD-10-CM

## 2016-09-18 DIAGNOSIS — R945 Abnormal results of liver function studies: Principal | ICD-10-CM

## 2016-09-18 LAB — VITAMIN D 25 HYDROXY (VIT D DEFICIENCY, FRACTURES): Vit D, 25-Hydroxy: 74 ng/mL (ref 30–100)

## 2016-09-19 ENCOUNTER — Other Ambulatory Visit: Payer: Self-pay | Admitting: Internal Medicine

## 2016-10-08 ENCOUNTER — Other Ambulatory Visit: Payer: Self-pay | Admitting: Physician Assistant

## 2016-10-08 DIAGNOSIS — F341 Dysthymic disorder: Secondary | ICD-10-CM

## 2016-10-08 MED ORDER — ALPRAZOLAM 0.5 MG PO TABS: 0.5000 mg | ORAL_TABLET | Freq: Three times a day (TID) | ORAL | 0 refills | Status: DC | PRN

## 2016-10-19 ENCOUNTER — Other Ambulatory Visit: Payer: Self-pay | Admitting: Physician Assistant

## 2016-10-20 NOTE — Telephone Encounter (Signed)
TRAMADOL WAS CALLED INTO PHARMACY ON 23rd April 2018 @ 12:11pm

## 2016-10-24 ENCOUNTER — Other Ambulatory Visit: Payer: Self-pay

## 2016-10-24 DIAGNOSIS — R945 Abnormal results of liver function studies: Principal | ICD-10-CM

## 2016-10-24 DIAGNOSIS — R7989 Other specified abnormal findings of blood chemistry: Secondary | ICD-10-CM

## 2016-10-24 LAB — HEPATIC FUNCTION PANEL
ALT: 36 U/L — AB (ref 6–29)
AST: 34 U/L (ref 10–35)
Albumin: 4.4 g/dL (ref 3.6–5.1)
Alkaline Phosphatase: 58 U/L (ref 33–130)
BILIRUBIN DIRECT: 0.1 mg/dL (ref <=0.2)
BILIRUBIN INDIRECT: 0.4 mg/dL (ref 0.2–1.2)
TOTAL PROTEIN: 7.1 g/dL (ref 6.1–8.1)
Total Bilirubin: 0.5 mg/dL (ref 0.2–1.2)

## 2016-10-25 LAB — HEPATITIS C ANTIBODY: HCV Ab: NEGATIVE

## 2016-10-30 ENCOUNTER — Encounter: Payer: Self-pay | Admitting: Physician Assistant

## 2016-10-30 MED ORDER — COLCHICINE 0.6 MG PO CAPS: 1.0000 | ORAL_CAPSULE | Freq: Every day | ORAL | 1 refills | Status: DC

## 2016-11-13 ENCOUNTER — Encounter: Payer: Self-pay | Admitting: Physician Assistant

## 2016-11-14 ENCOUNTER — Other Ambulatory Visit: Payer: Self-pay | Admitting: Internal Medicine

## 2016-12-29 ENCOUNTER — Encounter: Payer: Self-pay | Admitting: Physician Assistant

## 2017-01-07 ENCOUNTER — Other Ambulatory Visit: Payer: Self-pay | Admitting: Internal Medicine

## 2017-01-12 NOTE — Progress Notes (Signed)
Complete Physical  Assessment and Plan: Essential hypertension continue medications, DASH diet, exercise and monitor at home. Call if greater than 130/80. - CBC with Differential/Platelet - BASIC METABOLIC PANEL WITH GFR - Hepatic function panel  - TSH - Urinalysis, Routine w reflex microscopic (not at [facility]) - Microalbumin / creatinine urine ratio - EKG 12-Lead   Anemia, unspecified anemia type - , continue iron supp with Vitamin C and increase green leafy veggies   Asthma, unspecified asthma severity, uncomplicated Continue singulair, albuterol PRN, avoid triggers.   Allergy, subsequent encounter Continue OTC allergy pills  Hypokalemia - Magnesium   Hyperlipidemia - Lipid panel  Vitamin D deficiency - Vit D  25 hydroxy (rtn osteoporosis monitoring)  Anxiety -     Discontinue: buPROPion (WELLBUTRIN XL) 150 MG 24 hr tablet; Take 2 tablets (300 mg total) by mouth every morning. -     buPROPion (WELLBUTRIN XL) 150 MG 24 hr tablet; Take 2 tablets (300 mg total) by mouth every morning.  Chronic gout without tophus, unspecified cause, unspecified site -     Uric acid   Discussed med's effects and SE's. Screening labs and tests as requested with regular follow-up as recommended.  HPI 56 y.o. female  presents for a complete physical. Her blood pressure has been controlled at home, she is on lasix 20, atenolol 100mg  and diovan 320mg   today their BP is BP: 128/80 She does workout, walks the stairs at work. She denies chest pain, shortness of breath, dizziness.  She is not on cholesterol medication and denies myalgias. Her cholesterol is at goal. The cholesterol last visit was:   Lab Results  Component Value Date   CHOL 218 (H) 09/17/2016   HDL 77 09/17/2016   LDLCALC 119 (H) 09/17/2016   TRIG 109 09/17/2016   CHOLHDL 2.8 09/17/2016   Last A1C in the office was:  Lab Results  Component Value Date   HGBA1C 5.3 11/29/2013   Patient is on Vitamin D supplement.   Lab  Results  Component Value Date   VD25OH 74 09/17/2016   Patient is on allopurinol for gout and does not report a recent flare, she is on 1/2 of the allopurinol.   Lab Results  Component Value Date   LABURIC 2.8 09/20/2015   BMI is Body mass index is 24.98 kg/m., she is working on diet and exercise. She has been on phentermine and taking 1/2 AM and 1/2 PM, doing very well with weight loss, no issues with it.  Wt Readings from Last 3 Encounters:  01/14/17 138 lb 12.8 oz (63 kg)  09/17/16 155 lb 9.6 oz (70.6 kg)  07/30/16 164 lb (74.4 kg)   Current Medications:  Current Outpatient Prescriptions on File Prior to Visit  Medication Sig  . allopurinol (ZYLOPRIM) 300 MG tablet TAKE 1 TABLET BY MOUTH  DAILY  . ALPRAZolam (XANAX) 0.5 MG tablet Take 1 tablet (0.5 mg total) by mouth 3 (three) times daily as needed for anxiety.  . Ascorbic Acid (VITAMIN C PO) Take by mouth daily.  Marland Kitchen atenolol (TENORMIN) 100 MG tablet TAKE 1 TABLET BY MOUTH  DAILY  . buPROPion (WELLBUTRIN XL) 150 MG 24 hr tablet Take 2 tablets (300 mg total) by mouth every morning.  . cholecalciferol (VITAMIN D) 1000 units tablet Take 1,000 Units by mouth daily.  . Colchicine (MITIGARE) 0.6 MG CAPS Take 1 capsule by mouth daily.  . furosemide (LASIX) 40 MG tablet Take 1 tablet (40 mg total) by mouth daily.  Marland Kitchen MAGNESIUM  PO Take 400 mg by mouth daily.   Marland Kitchen MARLISSA 0.15-30 MG-MCG tablet TAKE 1 TABLET BY MOUTH  DAILY  . milk thistle 175 MG tablet Take 175 mg by mouth daily.  . montelukast (SINGULAIR) 10 MG tablet TAKE 1 TABLET BY MOUTH AT  BEDTIME  . Omega-3 Fatty Acids (FISH OIL) 1000 MG CAPS Take 1,000 mg by mouth daily.  Marland Kitchen omeprazole (PRILOSEC) 20 MG capsule Take 20 mg by mouth daily.   . phentermine (ADIPEX-P) 37.5 MG tablet Take 1 tablet (37.5 mg total) by mouth daily before breakfast.  . traMADol (ULTRAM) 50 MG tablet TAKE 1 TABLET BY MOUTH EVERY 6 HOURS AS NEEDED FOR PAIN  . traZODone (DESYREL) 50 MG tablet 1/2-1 tablet for  sleep  . valACYclovir (VALTREX) 500 MG tablet TAKE 1 TABLET (500 MG TOTAL) BY MOUTH 2 (TWO) TIMES DAILY.  . valsartan (DIOVAN) 320 MG tablet TAKE 1 TABLET BY MOUTH  DAILY   No current facility-administered medications on file prior to visit.     Patient Active Problem List   Diagnosis Date Noted  . Gout 12/12/2015  . Hyperlipidemia 12/12/2015  . Vitamin D deficiency 12/12/2015  . Hypokalemia 07/15/2013  . Hypertension   . Allergy   . Anemia   . Anxiety   . Asthma     Health Maintenance:   Immunization History  Administered Date(s) Administered  . Influenza-Unspecified 04/09/2016  . Pneumococcal Polysaccharide-23 06/30/1996  . Tdap 11/16/2012   Tetanus: 2014 Pneumovax: 1998 Prevnar 13: declines Flu vaccine: declines Zostavax: N/A  Pap: 2015 Negative due 5 years, never abnormal test MGM: 10/2014 OVER DUE DEXA: N/A Colonoscopy: 2011 due 2021 EGD: N/A  Allergies Allergies  Allergen Reactions  . Ciprofloxacin Nausea Only    Headache  . Penicillins     Allergy has been more than 28yrs but unsure of what reaction was    SURGICAL HISTORY She  has no past surgical history on file. FAMILY HISTORY Her family history includes Cataracts in her mother; Diabetes in her father and mother; Heart disease in her father; Hypertension in her father and mother; Thyroid disease in her mother. SOCIAL HISTORY She  reports that she has never smoked. She has never used smokeless tobacco. She reports that she drinks about 7.0 oz of alcohol per week . She reports that she does not use drugs.   Review of Systems  Constitutional: Negative.   HENT: Negative.   Eyes: Negative.   Respiratory: Negative.   Cardiovascular: Negative.   Gastrointestinal: Negative.   Genitourinary: Negative.   Musculoskeletal: Negative for back pain, falls, joint pain, myalgias and neck pain.  Skin: Negative.   Neurological: Negative.   Endo/Heme/Allergies: Negative.   Psychiatric/Behavioral: Negative for  depression, hallucinations, memory loss, substance abuse and suicidal ideas. The patient is not nervous/anxious and does not have insomnia.      Physical Exam: Estimated body mass index is 24.98 kg/m as calculated from the following:   Height as of this encounter: 5' 2.5" (1.588 m).   Weight as of this encounter: 138 lb 12.8 oz (63 kg). BP 128/80   Pulse 67   Temp 97.6 F (36.4 C)   Resp 14   Ht 5' 2.5" (1.588 m)   Wt 138 lb 12.8 oz (63 kg)   LMP 01/09/2017   SpO2 96%   BMI 24.98 kg/m  General Appearance: Well nourished, in no apparent distress. Eyes: PERRLA, EOMs, conjunctiva no swelling or erythema, normal fundi and vessels. Sinuses: No Frontal/maxillary tenderness ENT/Mouth: Ext  aud canals clear, normal light reflex with TMs without erythema, bulging.  Good dentition. No erythema, swelling, or exudate on post pharynx. Tonsils not swollen or erythematous. Hearing normal.  Neck: Supple, thyroid normal. No bruits Respiratory: Respiratory effort normal, BS equal bilaterally without rales, rhonchi, wheezing or stridor. Cardio: RRR without murmurs, rubs or gallops. Brisk peripheral pulses without edema.  Chest: symmetric, with normal excursions and percussion. Breasts: Symmetric, without lumps, nipple discharge, retractions. Abdomen: Soft, +BS. Non tender, no guarding, rebound, hernias, masses, or organomegaly. .  Lymphatics: Non tender without lymphadenopathy.  Genitourinary: defer Musculoskeletal: Full ROM all peripheral extremities,5/5 strength, and normal gait. Skin: Warm, dry without rashes, lesions, ecchymosis.  Neuro: Cranial nerves intact, reflexes equal bilaterally. Normal muscle tone, no cerebellar symptoms. Sensation intact.  Psych: Awake and oriented X 3, normal affect, Insight and Judgment appropriate.   EKG: WNL no changes. AORTA SCAN: N/A   Vicie Mutters 9:24 AM

## 2017-01-14 ENCOUNTER — Ambulatory Visit (INDEPENDENT_AMBULATORY_CARE_PROVIDER_SITE_OTHER): Payer: 59 | Admitting: Physician Assistant

## 2017-01-14 ENCOUNTER — Encounter: Payer: Self-pay | Admitting: Physician Assistant

## 2017-01-14 VITALS — BP 128/80 | HR 67 | Temp 97.6°F | Resp 14 | Ht 62.5 in | Wt 138.8 lb

## 2017-01-14 DIAGNOSIS — I10 Essential (primary) hypertension: Secondary | ICD-10-CM

## 2017-01-14 DIAGNOSIS — E559 Vitamin D deficiency, unspecified: Secondary | ICD-10-CM

## 2017-01-14 DIAGNOSIS — M1A9XX0 Chronic gout, unspecified, without tophus (tophi): Secondary | ICD-10-CM

## 2017-01-14 DIAGNOSIS — E876 Hypokalemia: Secondary | ICD-10-CM

## 2017-01-14 DIAGNOSIS — Z79899 Other long term (current) drug therapy: Secondary | ICD-10-CM

## 2017-01-14 DIAGNOSIS — Z0000 Encounter for general adult medical examination without abnormal findings: Secondary | ICD-10-CM

## 2017-01-14 DIAGNOSIS — Z136 Encounter for screening for cardiovascular disorders: Secondary | ICD-10-CM

## 2017-01-14 DIAGNOSIS — F419 Anxiety disorder, unspecified: Secondary | ICD-10-CM

## 2017-01-14 DIAGNOSIS — E785 Hyperlipidemia, unspecified: Secondary | ICD-10-CM

## 2017-01-14 DIAGNOSIS — T7840XD Allergy, unspecified, subsequent encounter: Secondary | ICD-10-CM

## 2017-01-14 DIAGNOSIS — Z0001 Encounter for general adult medical examination with abnormal findings: Secondary | ICD-10-CM

## 2017-01-14 DIAGNOSIS — D649 Anemia, unspecified: Secondary | ICD-10-CM

## 2017-01-14 DIAGNOSIS — J45909 Unspecified asthma, uncomplicated: Secondary | ICD-10-CM

## 2017-01-14 LAB — CBC WITH DIFFERENTIAL/PLATELET
BASOS ABS: 100 {cells}/uL (ref 0–200)
Basophils Relative: 1 %
EOS ABS: 200 {cells}/uL (ref 15–500)
Eosinophils Relative: 2 %
HEMATOCRIT: 40.8 % (ref 35.0–45.0)
HEMOGLOBIN: 13.7 g/dL (ref 11.7–15.5)
Lymphocytes Relative: 27 %
Lymphs Abs: 2700 cells/uL (ref 850–3900)
MCH: 34.3 pg — AB (ref 27.0–33.0)
MCHC: 33.6 g/dL (ref 32.0–36.0)
MCV: 102.3 fL — AB (ref 80.0–100.0)
MONO ABS: 700 {cells}/uL (ref 200–950)
MPV: 10 fL (ref 7.5–12.5)
Monocytes Relative: 7 %
NEUTROS ABS: 6300 {cells}/uL (ref 1500–7800)
Neutrophils Relative %: 63 %
Platelets: 343 10*3/uL (ref 140–400)
RBC: 3.99 MIL/uL (ref 3.80–5.10)
RDW: 14 % (ref 11.0–15.0)
WBC: 10 10*3/uL (ref 3.8–10.8)

## 2017-01-14 LAB — HEPATIC FUNCTION PANEL
ALBUMIN: 4.1 g/dL (ref 3.6–5.1)
ALK PHOS: 56 U/L (ref 33–130)
ALT: 28 U/L (ref 6–29)
AST: 24 U/L (ref 10–35)
Bilirubin, Direct: 0.1 mg/dL (ref <=0.2)
Indirect Bilirubin: 0.4 mg/dL (ref 0.2–1.2)
Total Bilirubin: 0.5 mg/dL (ref 0.2–1.2)
Total Protein: 6.6 g/dL (ref 6.1–8.1)

## 2017-01-14 LAB — BASIC METABOLIC PANEL WITH GFR
BUN: 9 mg/dL (ref 7–25)
CHLORIDE: 100 mmol/L (ref 98–110)
CO2: 27 mmol/L (ref 20–31)
CREATININE: 0.81 mg/dL (ref 0.50–1.05)
Calcium: 9.3 mg/dL (ref 8.6–10.4)
GFR, Est African American: >89 mL/min (ref >=60)
GFR, Est Non African American: 81 mL/min (ref >=60)
Glucose, Bld: 80 mg/dL (ref 65–99)
Potassium: 3.8 mmol/L (ref 3.5–5.3)
Sodium: 140 mmol/L (ref 135–146)

## 2017-01-14 LAB — LIPID PANEL
CHOL/HDL RATIO: 2.9 ratio (ref <5.0)
Cholesterol: 168 mg/dL (ref <200)
HDL: 57 mg/dL (ref >50)
LDL CALC: 90 mg/dL (ref <100)
Triglycerides: 105 mg/dL (ref <150)
VLDL: 21 mg/dL (ref <30)

## 2017-01-14 LAB — TSH: TSH: 1.38 m[IU]/L

## 2017-01-14 MED ORDER — BUPROPION HCL ER (XL) 150 MG PO TB24: 300.0000 mg | ORAL_TABLET | ORAL | 1 refills | Status: DC

## 2017-01-14 NOTE — Patient Instructions (Addendum)
[facility]  7 a.m.-6:30 p.m., Monday 7 a.m.-5 p.m., Tuesday-Friday Schedule an appointment by calling [phone].  Encourage you to get the 3D Mammogram  The 3D Mammogram is much more specific and sensitive to pick up breast cancer. For women with fibrocystic breast or lumpy breast it can be hard to determine if it is cancer or not but the 3D mammogram is able to tell this difference which cuts back on unneeded additional tests or scary call backs.   - over 40% increase in detection of breast cancer - over 40% reduction in false positives.  - fewer call backs - reduced anxiety - improved outcomes - [place] M[place]    Simple math prevails.    1st - exercise does not produce significant weight loss - at best one converts fat into muscle , "bulks up", loses inches, but usually stays "weight neutral"     2nd - think of your body weightas a check book: If you eat more calories than you burn up - you save money or gain weight .... Or if you spend more money than you put in the check book, ie burn up more calories than you eat, then you lose weight     3rd - if you walk or run 1 mile, you burn up 100 calories - you have to burn up 3,500 calories to lose 1 pound, ie you have to walk/run 35 miles to lose 1 measly pound. So if you want to lose 10 #, then you have to walk/run 350 miles, so.... clearly exercise is not the solution.     4. So if you consume 1,500 calories, then you have to burn up the equivalent of 15 miles to stay weight neutral - It also stands to reason that if you consume 1,500 cal/day and don't lose weight, then you must be burning up about 1,500 cals/day to stay weight neutral.     5. If you really want to lose weight, you must cut your calorie intake 300 calories /day and at that rate you should lose about 1 # every 3 days.   6. Please purchase Valerie Hobbs's book(s) "The End of Dieting" & "Eat to Live" . It has some great concepts and  recipes.      We want weight loss that will last so you should lose 1-2 pounds a week.  THAT IS IT! Please pick THREE things a month to change. Once it is a habit check off the item. Then pick another three items off the list to become habits.  If you are already doing a habit on the list GREAT!  Cross that item off! o Don't drink your calories. Ie, alcohol, soda, fruit juice, and sweet tea.  o Drink more water. Drink a glass when you feel hungry or before each meal.  o Eat breakfast - Complex carb and protein (likeDannon light and fit yogurt, oatmeal, fruit, eggs, [place] bacon). o Measure your cereal.  Eat no more than one cup a day. (ie [place]) o Eat an apple a day. o Add a vegetable a day. o Try a new vegetable a month. o Use Pam! Stop using oil or butter to cook. o Don't finish your plate or use smaller plates. o Share your dessert. o Eat sugar free Jello for dessert or frozen grapes. o Don't eat 2-3 hours before bed. o Switch to whole wheat bread, pasta, and brown rice. o Make healthier choices when you eat out. No  fries! o Pick baked chicken, NOT fried. o Don't forget to SLOW DOWN when you eat. It is not going anywhere.  o Take the stairs. o Park far away in the parking lot o [employer] (or weights) for 10 minutes while watching TV. o Walk at work for 10 minutes during break. o Walk outside 1 time a week with your friend, kids, dog, or significant other. o Start a walking group at [place] the mall as much as you can tolerate.  o Keep a food diary. o Weigh yourself daily. o Walk for 15 minutes 3 days per week. o Cook at home more often and eat out less.  If life happens and you go back to old habits, it is okay.  Just start over. You can do it!   If you experience chest pain, get short of breath, or tired during the exercise, please stop immediately and inform your doctor.   Before you even begin to attack a weight-loss plan, it pays to remember this: You are not  fat. You have fat. Losing weight isn't about blame or shame; it's simply another achievement to accomplish. Dieting is like any other skill-you have to buckle down and work at it. As long as you act in a smart, reasonable way, you'll ultimately get where you want to be. Here are some weight loss pearls for you.  1. It's Not a Diet. It's a Lifestyle Thinking of a diet as something you're on and suffering through only for the short term doesn't work. To shed weight and keep it off, you need to make permanent changes to the way you eat. It's OK to indulge occasionally, of course, but if you cut calories temporarily and then revert to your old way of eating, you'll gain back the weight quicker than you can say yo-yo. Use it to lose it. Research shows that one of the best predictors of long-term weight loss is how many pounds you drop in the first month. For that reason, nutritionists often suggest being stricter for the first two weeks of your new eating strategy to build momentum. Cut out added sugar and alcohol and avoid unrefined carbs. After that, figure out how you can reincorporate them in a way that's healthy and maintainable.  2. There's a Right Way to Exercise Working out burns calories and fat and boosts your metabolism by building muscle. But those trying to lose weight are notorious for overestimating the number of calories they burn and underestimating the amount they take in. Unfortunately, your system is biologically programmed to hold on to extra pounds and that means when you start exercising, your body senses the deficit and ramps up its hunger signals. If you're not diligent, you'll eat everything you burn and then some. Use it to lose it. Cardio gets all the exercise glory, but strength and interval training are the real heroes. They help you build lean muscle, which in turn increases your metabolism and calorie-burning ability 3. Don't Overreact to Mild Hunger Some people have a hard time  losing weight because of hunger anxiety. To them, being hungry is bad-something to be avoided at all costs-so they carry snacks with them and eat when they don't need to. Others eat because they're stressed out or bored. While you never want to get to the point of being ravenous (that's when bingeing is likely to happen), a hunger pang, a craving, or the fact that it's 3:00 p.m. should not send you racing for the  vending machine or obsessing about the energy bar in your purse. Ideally, you should put off eating until your stomach is growling and it's difficult to concentrate.  Use it to lose it. When you feel the urge to eat, use the HALT method. Ask yourself, Am I really hungry? Or am I angry or anxious, lonely or bored, or tired? If you're still not certain, try the apple test. If you're truly hungry, an apple should seem delicious; if it doesn't, something else is going on. Or you can try drinking water and making yourself busy, if you are still hungry try a healthy snack.  4. Not All Calories Are Created Equal The mechanics of weight loss are pretty simple: Take in fewer calories than you use for energy. But the kind of food you eat makes all the difference. Processed food that's high in saturated fat and refined starch or sugar can cause inflammation that disrupts the hormone signals that tell your brain you're full. The result: You eat a lot more.  Use it to lose it. Clean up your diet. Swap in whole, unprocessed foods, including vegetables, lean protein, and healthy fats that will fill you up and give you the biggest nutritional bang for your calorie buck. In a few weeks, as your brain starts receiving regular hunger and fullness signals once again, you'll notice that you feel less hungry overall and naturally start cutting back on the amount you eat.  5. Protein, Produce, and Plant-Based Fats Are Your Weight-Loss Trinity Here's why eating the three Ps regularly will help you drop pounds. Protein fills  you up. You need it to build lean muscle, which keeps your metabolism humming so that you can torch more fat. People in a weight-loss program who ate double the recommended daily allowance for protein (about 110 grams for a 150-pound woman) lost 70 percent of their weight from fat, while people who ate the RDA lost only about 40 percent, one study found. Produce is packed with filling fiber. "It's very difficult to consume too many calories if you're eating a lot of vegetables. Example: Three cups of broccoli is a lot of food, yet only 93 calories. (Fruit is another story. It can be easy to overeat and can contain a lot of calories from sugar, so be sure to monitor your intake.) Plant-based fats like olive oil and those in avocados and nuts are healthy and extra satiating.  Use it to lose it. Aim to incorporate each of the three Ps into every meal and snack. People who eat protein throughout the day are able to keep weight off, according to a study in the [place] of Clinical Nutrition. In addition to meat, poultry and seafood, good sources are beans, lentils, eggs, tofu, and yogurt. As for fat, keep portion sizes in check by measuring out salad dressing, oil, and nut butters (shoot for one to two tablespoons). Finally, eat veggies or a little fruit at every meal. People who did that consumed 308 fewer calories but didn't feel any hungrier than when they didn't eat more produce.  7. How You Eat Is As Important As What You Eat In order for your brain to register that you're full, you need to focus on what you're eating. Sit down whenever you eat, preferably at a table. Turn off the TV or computer, put down your phone, and look at your food. Smell it. Chew slowly, and don't put another bite on your fork until you swallow. When women ate lunch this  attentively, they consumed 30 percent less when snacking later than those who listened to an audiobook at lunchtime, according to a study in the [place] of Nutrition. 8. Weighing Yourself Really Works The scale provides the best evidence about whether your efforts are paying off. Seeing the numbers tick up or down or stagnate is motivation to keep going-or to rethink your approach. A 2015 study at [facility] found that daily weigh-ins helped people lose more weight, keep it off, and maintain that loss, even after two years. Use it to lose it. Step on the scale at the same time every day for the best results. If your weight shoots up several pounds from one weigh-in to the next, don't freak out. Eating a lot of salt the night before or having your period is the likely culprit. The number should return to normal in a day or two. It's a steady climb that you need to do something about. 9. Too Much Stress and Too Little Sleep Are Your Enemies When you're tired and frazzled, your body cranks up the production of cortisol, the stress hormone that can cause carb cravings. Not getting enough sleep also boosts your levels of ghrelin, a hormone associated with hunger, while suppressing leptin, a hormone that signals fullness and satiety. People on a diet who slept only five and a half hours a night for two weeks lost 55 percent less fat and were hungrier than those who slept eight and a half hours, according to a study in the [place]. Use it to lose it. Prioritize sleep, aiming for seven hours or more a night, which research shows helps lower stress. And make sure you're getting quality zzz's. If a snoring spouse or a fidgety cat wakes you up frequently throughout the night, you may end up getting the equivalent of just four hours of sleep, according to a study from [facility]. Keep pets out of the bedroom, and use a white-noise app to drown out snoring. 10. You Will Hit a plateau-And You Can Bust Through It As you slim down, your body releases much less leptin, the fullness hormone.  If you're not strength  training, start right now. Building muscle can raise your metabolism to help you overcome a plateau. To keep your body challenged and burning calories, incorporate new moves and more intense intervals into your workouts or add another sweat session to your weekly routine. Alternatively, cut an extra 100 calories or so a day from your diet. Now that you've lost weight, your body simply doesn't need as much fuel.

## 2017-01-15 ENCOUNTER — Encounter: Payer: Self-pay | Admitting: Physician Assistant

## 2017-01-15 LAB — URINALYSIS, ROUTINE W REFLEX MICROSCOPIC
Bilirubin Urine: NEGATIVE
GLUCOSE, UA: NEGATIVE
HGB URINE DIPSTICK: NEGATIVE
KETONES UR: NEGATIVE
LEUKOCYTES UA: NEGATIVE
NITRITE: NEGATIVE
PROTEIN: NEGATIVE
Specific Gravity, Urine: 1.005 (ref 1.001–1.035)
pH: 7.5 (ref 5.0–8.0)

## 2017-01-15 LAB — MICROALBUMIN / CREATININE URINE RATIO: CREATININE, URINE: 13 mg/dL — AB (ref 20–320)

## 2017-01-15 LAB — URIC ACID: Uric Acid, Serum: 3.3 mg/dL (ref 2.5–7.0)

## 2017-01-15 LAB — VITAMIN D 25 HYDROXY (VIT D DEFICIENCY, FRACTURES): VIT D 25 HYDROXY: 109 ng/mL — AB (ref 30–100)

## 2017-01-15 LAB — MAGNESIUM: MAGNESIUM: 2.1 mg/dL (ref 1.5–2.5)

## 2017-01-26 ENCOUNTER — Encounter: Payer: Self-pay | Admitting: Physician Assistant

## 2017-01-26 MED ORDER — TELMISARTAN 80 MG PO TABS: 80.0000 mg | ORAL_TABLET | Freq: Every day | ORAL | 0 refills | Status: DC

## 2017-02-02 ENCOUNTER — Other Ambulatory Visit: Payer: Self-pay | Admitting: Physician Assistant

## 2017-02-02 NOTE — Progress Notes (Unsigned)
Future Appointments Date Time Provider [place]  07/17/2017 8:45 AM Vicie Mutters, PA-C [facility] None  01/14/2018 9:00 AM Vicie Mutters, PA-C [facility] None

## 2017-02-03 ENCOUNTER — Other Ambulatory Visit: Payer: Self-pay | Admitting: Physician Assistant

## 2017-02-03 DIAGNOSIS — F341 Dysthymic disorder: Secondary | ICD-10-CM

## 2017-02-03 MED ORDER — ALPRAZOLAM 0.5 MG PO TABS: 0.5000 mg | ORAL_TABLET | Freq: Three times a day (TID) | ORAL | 0 refills | Status: DC | PRN

## 2017-02-07 ENCOUNTER — Other Ambulatory Visit: Payer: Self-pay | Admitting: Physician Assistant

## 2017-02-11 ENCOUNTER — Other Ambulatory Visit: Payer: Self-pay | Admitting: Internal Medicine

## 2017-02-24 ENCOUNTER — Other Ambulatory Visit: Payer: Self-pay | Admitting: Physician Assistant

## 2017-02-25 MED ORDER — TELMISARTAN 80 MG PO TABS: 80.0000 mg | ORAL_TABLET | Freq: Every day | ORAL | 0 refills | Status: DC

## 2017-03-12 ENCOUNTER — Other Ambulatory Visit: Payer: Self-pay | Admitting: Physician Assistant

## 2017-03-12 DIAGNOSIS — Z1231 Encounter for screening mammogram for malignant neoplasm of breast: Secondary | ICD-10-CM

## 2017-03-17 ENCOUNTER — Other Ambulatory Visit: Payer: Self-pay | Admitting: Physician Assistant

## 2017-03-17 ENCOUNTER — Encounter: Payer: Self-pay | Admitting: Physician Assistant

## 2017-03-17 MED ORDER — MELOXICAM 15 MG PO TABS
ORAL_TABLET | ORAL | 1 refills | Status: DC
Start: 2017-03-17 — End: 2017-07-17

## 2017-04-14 ENCOUNTER — Other Ambulatory Visit: Payer: Self-pay | Admitting: Physician Assistant

## 2017-04-15 ENCOUNTER — Other Ambulatory Visit: Payer: Self-pay | Admitting: Physician Assistant

## 2017-04-15 DIAGNOSIS — F341 Dysthymic disorder: Secondary | ICD-10-CM

## 2017-04-15 MED ORDER — ALPRAZOLAM 0.5 MG PO TABS: 0.5000 mg | ORAL_TABLET | Freq: Three times a day (TID) | ORAL | 0 refills | Status: DC | PRN

## 2017-04-15 NOTE — Progress Notes (Unsigned)
Future Appointments Date Time Provider [place]  04/27/2017 6:00 PM [facility]  07/17/2017 8:45 AM Vicie Mutters, PA-C [facility] None  01/14/2018 9:00 AM Vicie Mutters, PA-C [facility] None

## 2017-04-15 NOTE — Progress Notes (Signed)
XANAX HAS BEEN CALLED INTO PHARMACY ON 17TH OCT 2018  BY DD

## 2017-04-18 DIAGNOSIS — Z23 Encounter for immunization: Secondary | ICD-10-CM | POA: Diagnosis not present

## 2017-04-27 ENCOUNTER — Ambulatory Visit: Payer: 59

## 2017-05-18 ENCOUNTER — Ambulatory Visit
Admission: RE | Admit: 2017-05-18 | Discharge: 2017-05-18 | Disposition: A | Discharge status: Home / Self Care | Payer: 59 | Source: Ambulatory Visit | Attending: Physician Assistant | Admitting: Physician Assistant

## 2017-05-18 ENCOUNTER — Encounter: Payer: Self-pay | Admitting: Physician Assistant

## 2017-05-18 DIAGNOSIS — Z1231 Encounter for screening mammogram for malignant neoplasm of breast: Secondary | ICD-10-CM

## 2017-05-18 MED ORDER — PHENTERMINE HCL 37.5 MG PO TABS: 37.5000 mg | ORAL_TABLET | Freq: Every day | ORAL | 2 refills | Status: DC

## 2017-06-04 ENCOUNTER — Other Ambulatory Visit: Payer: Self-pay | Admitting: Internal Medicine

## 2017-06-04 ENCOUNTER — Other Ambulatory Visit: Payer: Self-pay | Admitting: Physician Assistant

## 2017-07-16 NOTE — Progress Notes (Signed)
Assessment and Plan:  Essential hypertension - continue medications, DASH diet, exercise and monitor at home. Call if greater than 130/80.   Morbid Obesity with co morbidities - follow up 6 months for progress monitoring - increase veggies, decrease carbs - long discussion about weight loss, diet, and exercise -     phentermine (ADIPEX-P) 37.5 MG tablet; Take 1 tablet (37.5 mg total) by mouth daily before breakfast. - follow up at CPE  Hyperlipidemia, unspecified hyperlipidemia type -continue medications, check lipids, decrease fatty foods, increase activity. '  Anemia, unspecified type - monitor, continue iron supp with Vitamin C and increase green leafy veggies - check iron/ferritin, continue B12  Insomnia, unspecified type -     traZODone (DESYREL) 50 MG tablet; 1/2-1 tablet for sleep sent to [facility] - continue xanax PRN  Continue diet and meds as discussed. Further disposition pending results of labs. Over 30 minutes of exam, counseling, chart review, and critical decision making was performed Future Appointments  Date Time Provider [place]  07/17/2017  8:45 AM Vicie Mutters, PA-C [facility] None  01/14/2018  9:00 AM Vicie Mutters, PA-C [facility] None    HPI 57 y.o. female  presents for 3 month follow up on hypertension, cholesterol, prediabetes, and vitamin D deficiency.   Her blood pressure has been controlled at home, she is on atenolol at night 50mg , today their BP is BP: 132/80  Taking care of her dad after her mom passed August, he is 45, can not drive, trying to keep him at home.  She is on trazodone 50mg  at night to decrease xanax use at night, she is on xanax as needed, the most is once a day.   She does workout. She denies chest pain, shortness of breath, dizziness.  She is not on cholesterol medication and denies myalgias. Her cholesterol is at goal. The cholesterol last visit was:   Lab Results  Component Value Date   CHOL 168 01/14/2017   HDL 57  01/14/2017   LDLCALC 90 01/14/2017   TRIG 105 01/14/2017   CHOLHDL 2.9 01/14/2017   Last A1C in the office was:  Lab Results  Component Value Date   HGBA1C 5.3 11/29/2013   Patient is on Vitamin D supplement.   Lab Results  Component Value Date   VD25OH 109 (H) 01/14/2017     Eats 2 meals a day, she does bored eat/emotional eat BMI is Body mass index is 27.36 kg/m., she is working on diet and exercise. Has been off phentermine since July, started back in Dec but got sick, has recovered from sickness but has gained weight, wants to retry the phentermine.  Wt Readings from Last 3 Encounters:  07/17/17 152 lb (68.9 kg)  01/14/17 138 lb 12.8 oz (63 kg)  09/17/16 155 lb 9.6 oz (70.6 kg)    Current Medications:  Current Outpatient Medications on File Prior to Visit  Medication Sig Dispense Refill  . allopurinol (ZYLOPRIM) 300 MG tablet TAKE 1 TABLET BY MOUTH  DAILY 90 tablet 1  . ALPRAZolam (XANAX) 0.5 MG tablet Take 1 tablet (0.5 mg total) by mouth 3 (three) times daily as needed for anxiety. 90 tablet 0  . Ascorbic Acid (VITAMIN C PO) Take by mouth daily.    Marland Kitchen atenolol (TENORMIN) 100 MG tablet TAKE 1 TABLET BY MOUTH  DAILY 90 tablet 1  . buPROPion (WELLBUTRIN XL) 150 MG 24 hr tablet Take 2 tablets (300 mg total) by mouth every morning. 180 tablet 1  . cholecalciferol (VITAMIN D)  1000 units tablet Take 1,000 Units by mouth daily.    . furosemide (LASIX) 40 MG tablet TAKE 1 TABLET BY MOUTH  DAILY 90 tablet 1  . MAGNESIUM PO Take 400 mg by mouth daily.     . milk thistle 175 MG tablet Take 175 mg by mouth daily.    . montelukast (SINGULAIR) 10 MG tablet TAKE 1 TABLET BY MOUTH AT  BEDTIME 90 tablet 3  . Omega-3 Fatty Acids (FISH OIL) 1000 MG CAPS Take 1,000 mg by mouth daily.    Marland Kitchen omeprazole (PRILOSEC) 20 MG capsule Take 20 mg by mouth daily.     . phentermine (ADIPEX-P) 37.5 MG tablet Take 1 tablet (37.5 mg total) daily before breakfast by mouth. 30 tablet 2  . PORTIA-28 0.15-30  MG-MCG tablet TAKE 1 TABLET BY MOUTH  DAILY 84 tablet 3  . telmisartan (MICARDIS) 80 MG tablet TAKE 1 TABLET BY MOUTH  DAILY 90 tablet 0  . traZODone (DESYREL) 50 MG tablet TAKE 1/2-1 TABLET FOR SLEEP 90 tablet 1  . valACYclovir (VALTREX) 500 MG tablet TAKE 1 TABLET (500 MG TOTAL) BY MOUTH 2 (TWO) TIMES DAILY. 180 tablet 1   No current facility-administered medications on file prior to visit.    Medical History:  Past Medical History:  Diagnosis Date  . Allergy   . Anemia   . Anxiety   . Asthma   . Hypertension    Allergies:  Allergies  Allergen Reactions  . Ciprofloxacin Nausea Only    Headache  . Penicillins     Allergy has been more than 71yrs but unsure of what reaction was    Review of Systems:  Review of Systems  Constitutional: Negative.   HENT: Negative.   Eyes: Negative.   Respiratory: Negative.   Cardiovascular: Negative.   Gastrointestinal: Negative.   Genitourinary: Negative.   Musculoskeletal: Positive for back pain. Negative for falls, joint pain, myalgias and neck pain.  Skin: Negative.   Neurological: Negative.   Endo/Heme/Allergies: Negative.   Psychiatric/Behavioral: Negative for depression, hallucinations, memory loss, substance abuse and suicidal ideas. The patient is nervous/anxious. The patient does not have insomnia.     Family history- Review and unchanged Social history- Review and unchanged Physical Exam: BP 132/80   Pulse 83   Temp 98.6 F (37 C)   Resp 14   Ht 5' 2.5" (1.588 m)   Wt 152 lb (68.9 kg)   LMP 07/08/2017   SpO2 99%   BMI 27.36 kg/m  Wt Readings from Last 3 Encounters:  07/17/17 152 lb (68.9 kg)  01/14/17 138 lb 12.8 oz (63 kg)  09/17/16 155 lb 9.6 oz (70.6 kg)   General Appearance: Well nourished, in no apparent distress. Eyes: PERRLA, EOMs, conjunctiva no swelling or erythema Sinuses: No Frontal/maxillary tenderness ENT/Mouth: Ext aud canals clear, TMs without erythema, bulging. No erythema, swelling, or exudate on  post pharynx.  Tonsils not swollen or erythematous. Hearing normal.  Neck: Supple, thyroid normal.  Respiratory: Respiratory effort normal, BS equal bilaterally without rales, rhonchi, wheezing or stridor.  Cardio: RRR with no MRGs. Brisk peripheral pulses without edema.  Abdomen: Soft, + BS,  Non tender, no guarding, rebound, hernias, masses. Lymphatics: Non tender without lymphadenopathy.  Musculoskeletal: Full ROM, 5/5 strength, Normal gait Skin: Warm, dry without rashes, lesions, ecchymosis.  Neuro: Cranial nerves intact. Normal muscle tone, no cerebellar symptoms. Psych: Awake and oriented X 3, normal affect, Insight and Judgment appropriate.    Vicie Mutters, PA-C 8:39 AM [facility]

## 2017-07-17 ENCOUNTER — Encounter: Payer: Self-pay | Admitting: Physician Assistant

## 2017-07-17 ENCOUNTER — Ambulatory Visit: Payer: 59 | Admitting: Physician Assistant

## 2017-07-17 VITALS — BP 132/80 | HR 83 | Temp 98.6°F | Resp 14 | Ht 62.5 in | Wt 152.0 lb

## 2017-07-17 DIAGNOSIS — D649 Anemia, unspecified: Secondary | ICD-10-CM | POA: Diagnosis not present

## 2017-07-17 DIAGNOSIS — I10 Essential (primary) hypertension: Secondary | ICD-10-CM | POA: Diagnosis not present

## 2017-07-17 DIAGNOSIS — E876 Hypokalemia: Secondary | ICD-10-CM | POA: Diagnosis not present

## 2017-07-17 DIAGNOSIS — E785 Hyperlipidemia, unspecified: Secondary | ICD-10-CM

## 2017-07-17 DIAGNOSIS — E559 Vitamin D deficiency, unspecified: Secondary | ICD-10-CM | POA: Diagnosis not present

## 2017-07-17 NOTE — Patient Instructions (Signed)
Being dehydrated can hurt your kidneys, cause fatigue, headaches, muscle aches, joint pain, and dry skin/nails so please increase your fluids.   Drink 80-100 oz a day of water, measure it out Can check out plantnanny app on your phone to help you keep track of your water   8 Critical Weight-Loss Tips That Aren't Diet and Exercise  1. STARVE THE DISTRACTIONS  All too often when we eat, we're also multitasking: watching TV, answering emails, scrolling through social media. These habits are detrimental to having a strong, clear, healthy relationship with food, and they can hinder our ability to make dietary changes.  In order to truly focus on what you're eating, how much you're eating, why you're eating those specific foods and, most importantly, how those foods make you feel, you need to starve the distractions. That means when you eat, just eat. Focus on your food, the process it went through to end up on your plate, where it came from and how it nourishes you. With this technique, you're more likely to finish a meal feeling satiated.  2.  CONSIDER WHAT YOU'RE NOT WILLING TO DO  This might sound counterintuitive, but it can help provide a "why" when motivation is waning. Declare, in writing, what you are unwilling to do, for example "I am unwilling to be the old dad who cannot play sports with my children".  So consider what you're not willing to accept, write it down, and keep it at the ready.  3.  STOP LABELING FOOD "GOOD" AND "BAD"  You've probably heard someone say they ate something "bad." Maybe you've even said it yourself.  The trouble with 'bad' foods isn't that they'll send you to the grave after a bite or two. The trouble comes when we eat excessive portions of really calorie-dense foods meal after meal, day after day.  Instead of labeling foods as good or bad, think about which foods you can eat a lot of, and which ones you should just eat a little of. Then, plan ways to eat the  foods you really like in portions that fit with your overall goals. A good example of this would be having a slice of pizza alongside a club salad with chicken breast, avocado and a bit of dressing. This is vastly different than 3 slices of pizza, 4 breadsticks with cheese sauce and half of a liter of regular soda.  4.  BRUSH YOUR TEETH AFTER YOU EAT  Getting your mindset in order is important, but sometimes small habits can make a big difference. After eating, you still have the taste of food in their mouth, which often causes people to eat more even if they are full or engage in a nibble or two of dessert.  Brushing your teeth will remove the taste of food from your mouth, and the clean, minty freshness will serve as a cue that mealtime is over.  5.  FOCUS ON CROWDING NOT CUTTING  The most common first step during 'dieting' is to cut. We cut our portion sizes down, we cut out 'bad' foods, we cut out entire food groups. This act of cutting puts [place] and our minds into scarcity mode.  When something is off-limits, even if you're able to avoid it for a while, you could end up bingeing on it later because you've gone so long without it. So, instead of cutting, focus on crowding. If you crowd your plate and fill it up with more foods like veggies and protein, it simply  allows less room for the other stuff. In other words, shift your focus away from what you can't eat, and celebrate the foods that will help you reach your goals.  6.  TAKE TRACKING A STEP FURTHER  Track what you eat, when you ate it, how much you ate and how that food made you feel. Being completely honest with yourself and writing down every single thing that passes through your lips will help you start to notice that maybe you actually do snack, possibly take in more sugar than you thought, eat when you're bored rather than just hungry or maybe that you have a habit of snacking before bed while watching TV.  The difference from simply  tracking your food intake is you're taking into account how food makes you feel, as well as what you're doing while you're eating. This is about becoming more mindful of what, when and why you eat.  7.  PRIORITIZE GOOD SLEEP  One of the strongest risk factors for being overweight is poor sleep. When you're feeling tired, you're more likely to choose unhealthy comfort foods and to skip your workout. Additionally, sleep deprivation may slow down your metabolism. [employer]! Therefore, sleeping 7-8 hours per night can help with weight loss without having to change your diet or increase your physical activity. And if you feel you snore and still wake up tired, talk with me about sleep apnea.  8.  SET ASIDE TIME TO DISCONNECT  Just get out there. Disconnect from the electronics and connect to the elements. Not only will this help reduce stress (a major factor in weight gain) by giving your mind a break from the constant stimulation we've all become so accustomed to, but it may also reprogram your brain to connect with yourself and what you're feeling.  Intermittent fasting is more about strategy than starvation. It's meant to reset your body in different ways, hopefully with fitness and nutrition changes as a result.  Like any big switchover, though, results may vary when it comes down to the individual level. What works for your friends may not work for you, or vice versa. That's why it's helpful to play around with variations on intermittent fasting and healthy habits and find what works best for you.  WHAT IS INTERMITTENT FASTING AND WHY DO IT?  Intermittent fasting doesn't involve specific foods, but rather, a strict schedule regarding when you eat. Also called "time-restricted eating," the tactic has been praised for its contribution to weight loss, improved body composition, and decreased cravings. Preliminary research also suggests it may be beneficial for glucose tolerance, hormone regulation, better  muscle mass and lower body fat.  Part of its appeal is the simplicity of the effort. Unlike some other trends, there's no calculations to intermittent fasting.  You simply eat within a certain block of time, usually a window of 8-10 hours. In the other big block of time - about 14-16 hours, including when you're asleep - you don't eat anything, not even snacks. You can drink water, coffee, tea or any other beverage that doesn't have calories.  For example, if you like having a late dinner, you might skip breakfast and have your first meal at noon and your last meal of the day at 8 p.m., and then not eat until noon again the next day.  IDEAS FOR GETTING STARTED  If you're new to the strategy, it may be helpful to eat within the typical circadian rhythm and keep eating within daylight hours. This can  be especially beneficial if you're looking at intermittent fasting for weight-loss goals.  So first try only eating between 12pm to 8pm.  Outside of this time you may have water, black coffee, and hot tea. You may not eat it drink anything that has carbs, sugars, OR artificial sugars like diet soda.   Like any major eating and fitness shift, it can take time to find the perfect fit, so don't be afraid to experiment with different options - including ditching intermittent fasting altogether if it's simply not for you. But if it is, you may be surprised by some of the benefits that come along with the strategy.  Are you an emotional eater? Do you eat more when you're feeling stressed? Do you eat when you're not hungry or when you're full? Do you eat to feel better (to calm and soothe yourself when you're sad, mad, bored, anxious, etc.)? Do you reward yourself with food? Do you regularly eat until you've stuffed yourself? Does food make you feel safe? Do you feel like food is a friend? Do you feel powerless or out of control around food?  If you answered yes to some of these questions than it is  likely that you are an emotional eater. This is normally a learned behavior and can take time to first recognize the signs and second BREAK THE HABIT. But here is more information and tips to help.   The difference between emotional hunger and physical hunger Emotional hunger can be powerful, so it's easy to mistake it for physical hunger. But there are clues you can look for to help you tell physical and emotional hunger apart.  Emotional hunger comes on suddenly. It hits you in an instant and feels overwhelming and urgent. Physical hunger, on the other hand, comes on more gradually. The urge to eat doesn't feel as dire or demand instant satisfaction (unless you haven't eaten for a very long time).  Emotional hunger craves specific comfort foods. When you're physically hungry, almost anything sounds good-including healthy stuff like vegetables. But emotional hunger craves junk food or sugary snacks that provide an instant rush. You feel like you need cheesecake or pizza, and nothing else will do.  Emotional hunger often leads to mindless eating. Before you know it, you've eaten a whole bag of chips or an entire pint of ice cream without really paying attention or fully enjoying it. When you're eating in response to physical hunger, you're typically more aware of what you're doing.  Emotional hunger isn't satisfied once you're full. You keep wanting more and more, often eating until you're uncomfortably stuffed. Physical hunger, on the other hand, doesn't need to be stuffed. You feel satisfied when your stomach is full.  Emotional hunger isn't located in the stomach. Rather than a growling belly or a pang in your stomach, you feel your hunger as a craving you can't get out of your head. You're focused on specific textures, tastes, and smells.  Emotional hunger often leads to regret, guilt, or shame. When you eat to satisfy physical hunger, you're unlikely to feel guilty or ashamed because you're  simply giving your body what it needs. If you feel guilty after you eat, it's likely because you know deep down that you're not eating for nutritional reasons.  Identify your emotional eating triggers What situations, places, or feelings make you reach for the comfort of food? Most emotional eating is linked to unpleasant feelings, but it can also be triggered by positive emotions, such as  rewarding yourself for achieving a goal or celebrating a holiday or happy event. Common causes of emotional eating include:  Stuffing emotions - Eating can be a way to temporarily silence or "stuff down" uncomfortable emotions, including anger, fear, sadness, anxiety, loneliness, resentment, and shame. While you're numbing yourself with food, you can avoid the difficult emotions you'd rather not feel.  Boredom or feelings of emptiness - Do you ever eat simply to give yourself something to do, to relieve boredom, or as a way to fill a void in your life? You feel unfulfilled and empty, and food is a way to occupy your mouth and your time. In the moment, it fills you up and distracts you from underlying feelings of purposelessness and dissatisfaction with your life.  Childhood habits - Think back to your childhood memories of food. Did your parents reward good behavior with ice cream, take you out for pizza when you got a good report card, or serve you sweets when you were feeling sad? These habits can often carry over into adulthood. Or your eating may be driven by nostalgia-for cherished memories of grilling burgers in the backyard with your dad or baking and eating cookies with your mom.  Social influences - Getting together with other people for a meal is a great way to relieve stress, but it can also lead to overeating. It's easy to overindulge simply because the food is there or because everyone else is eating. You may also overeat in social situations out of nervousness. Or perhaps your family or circle of friends  encourages you to overeat, and it's easier to go along with the group.  Stress - Ever notice how stress makes you hungry? It's not just in your mind. When stress is chronic, as it so often is in our chaotic, fast-paced world, your body produces high levels of the stress hormone, cortisol. Cortisol triggers cravings for salty, sweet, and fried foods-foods that give you a burst of energy and pleasure. The more uncontrolled stress in your life, the more likely you are to turn to food for emotional relief.  Find other ways to feed your feelings If you don't know how to manage your emotions in a way that doesn't involve food, you won't be able to control your eating habits for very long. Diets so often fail because they offer logical nutritional advice which only works if you have conscious control over your eating habits. It doesn't work when emotions hijack the process, demanding an immediate payoff with food.  In order to stop emotional eating, you have to find other ways to fulfill yourself emotionally. It's not enough to understand the cycle of emotional eating or even to understand your triggers, although that's a huge first step. You need alternatives to food that you can turn to for emotional fulfillment.  Alternatives to emotional eating If you're depressed or lonely, call someone who always makes you feel better, play with your dog or cat, or look at a favorite photo or cherished memento.  If you're anxious, expend your nervous energy by dancing to your favorite song, squeezing a stress ball, or taking a brisk walk.  If you're exhausted, treat yourself with a hot cup of tea, take a bath, light some scented candles, or wrap yourself in a warm blanket.  If you're bored, read a good book, watch a comedy show, explore the outdoors, or turn to an activity you enjoy (woodworking, playing the guitar, shooting hoops, scrapbooking, etc.).  What is mindful eating? Mindful  eating is a practice that  develops your awareness of eating habits and allows you to pause between your triggers and your actions. Most emotional eaters feel powerless over their food cravings. When the urge to eat hits, you feel an almost unbearable tension that demands to be fed, right now. Because you've tried to resist in the past and failed, you believe that your willpower just isn't up to snuff. But the truth is that you have more power over your cravings than you think.  Take 5 before you give in to a craving Emotional eating tends to be automatic and virtually mindless. Before you even realize what you're doing, you've reached for a tub of ice cream and polished off half of it. But if you can take a moment to pause and reflect when you're hit with a craving, you give yourself the opportunity to make a different decision.  Can you put off eating for five minutes? Or just start with one minute. Don't tell yourself you can't give in to the craving; remember, the forbidden is extremely tempting. Just tell yourself to wait.  While you're waiting, check in with yourself. How are you feeling? What's going on emotionally? Even if you end up eating, you'll have a better understanding of why you did it. This can help you set yourself up for a different response next time.  How to practice mindful eating Eating while you're also doing other things-such as watching TV, driving, or playing with your phone-can prevent you from fully enjoying your food. Since your mind is elsewhere, you may not feel satisfied or continue eating even though you're no longer hungry. Eating more mindfully can help focus your mind on your food and the pleasure of a meal and curb overeating.   Eat your meals in a calm place with no distractions, aside from any dining companions.  Try eating with your non-dominant hand or using chopsticks instead of a knife and fork. Eating in such a non-familiar way can slow down how fast you eat and ensure your mind  stays focused on your food.  Allow yourself enough time not to have to rush your meal. Set a timer for 20 minutes and pace yourself so you spend at least that much time eating.  Take small bites and chew them well, taking time to notice the different flavors and textures of each mouthful.  Put your utensils down between bites. Take time to consider how you feel-hungry, satiated-before picking up your utensils again.  Try to stop eating before you are full.It takes time for the signal to reach your brain that you've had enough. Don't feel obligated to always clean your plate.  When you've finished your food, take a few moments to assess if you're really still hungry before opting for an extra serving or dessert.  Learn to accept your feelings-even the bad ones  While it may seem that the core problem is that you're powerless over food, emotional eating actually stems from feeling powerless over your emotions. You don't feel capable of dealing with your feelings head on, so you avoid them with food.  Recommended reading  Healthy Eating: A guide to the new nutrition - [employer]  10 Tips for Mindful Eating - How mindfulness can help you fully enjoy a meal and the experience of eating-with moderation and restraint. ([place])  Weight Loss: Gain Control of Emotional Eating - Tips to regain control of your eating habits. [facility])  Why Stress Causes People to Overeat -Tips on controlling stress eating. ([place])  Mindful Eating Meditations -Free online mindfulness meditations. ([facility])

## 2017-07-18 ENCOUNTER — Other Ambulatory Visit: Payer: Self-pay | Admitting: Physician Assistant

## 2017-07-18 DIAGNOSIS — F341 Dysthymic disorder: Secondary | ICD-10-CM

## 2017-07-18 LAB — BASIC METABOLIC PANEL WITH GFR
BUN: 11 mg/dL (ref 7–25)
CHLORIDE: 105 mmol/L (ref 98–110)
CO2: 27 mmol/L (ref 20–32)
CREATININE: 0.77 mg/dL (ref 0.50–1.05)
Calcium: 9.6 mg/dL (ref 8.6–10.4)
GFR, EST AFRICAN AMERICAN: 99 mL/min/{1.73_m2} (ref >=60)
GFR, Est Non African American: 86 mL/min/{1.73_m2} (ref >=60)
Glucose, Bld: 93 mg/dL (ref 65–99)
Potassium: 4.1 mmol/L (ref 3.5–5.3)
Sodium: 140 mmol/L (ref 135–146)

## 2017-07-18 LAB — CBC WITH DIFFERENTIAL/PLATELET
BASOS PCT: 1.2 %
Basophils Absolute: 146 cells/uL (ref 0–200)
EOS ABS: 403 {cells}/uL (ref 15–500)
Eosinophils Relative: 3.3 %
HCT: 40.7 % (ref 35.0–45.0)
Hemoglobin: 13.7 g/dL (ref 11.7–15.5)
Lymphs Abs: 2269 cells/uL (ref 850–3900)
MCH: 33 pg (ref 27.0–33.0)
MCHC: 33.7 g/dL (ref 32.0–36.0)
MCV: 98.1 fL (ref 80.0–100.0)
MPV: 10.6 fL (ref 7.5–12.5)
Monocytes Relative: 6.8 %
Neutro Abs: 8552 cells/uL — ABNORMAL HIGH (ref 1500–7800)
Neutrophils Relative %: 70.1 %
PLATELETS: 318 10*3/uL (ref 140–400)
RBC: 4.15 10*6/uL (ref 3.80–5.10)
RDW: 12.6 % (ref 11.0–15.0)
TOTAL LYMPHOCYTE: 18.6 %
WBC: 12.2 10*3/uL — ABNORMAL HIGH (ref 3.8–10.8)
WBCMIX: 830 {cells}/uL (ref 200–950)

## 2017-07-18 LAB — TSH: TSH: 1.08 mIU/L (ref 0.40–4.50)

## 2017-07-18 LAB — LIPID PANEL
CHOL/HDL RATIO: 2.4 (calc) (ref <5.0)
Cholesterol: 170 mg/dL (ref <200)
HDL: 71 mg/dL (ref >50)
LDL Cholesterol (Calc): 83 mg/dL (calc)
NON-HDL CHOLESTEROL (CALC): 99 mg/dL (ref <130)
Triglycerides: 77 mg/dL (ref <150)

## 2017-07-18 LAB — HEPATIC FUNCTION PANEL
AG RATIO: 1.8 (calc) (ref 1.0–2.5)
ALT: 21 U/L (ref 6–29)
AST: 23 U/L (ref 10–35)
Albumin: 4.3 g/dL (ref 3.6–5.1)
Alkaline phosphatase (APISO): 68 U/L (ref 33–130)
BILIRUBIN INDIRECT: 0.4 mg/dL (ref 0.2–1.2)
BILIRUBIN TOTAL: 0.5 mg/dL (ref 0.2–1.2)
Bilirubin, Direct: 0.1 mg/dL (ref 0.0–0.2)
GLOBULIN: 2.4 g/dL (ref 1.9–3.7)
Total Protein: 6.7 g/dL (ref 6.1–8.1)

## 2017-07-18 LAB — VITAMIN D 25 HYDROXY (VIT D DEFICIENCY, FRACTURES): VIT D 25 HYDROXY: 41 ng/mL (ref 30–100)

## 2017-07-23 ENCOUNTER — Encounter: Payer: Self-pay | Admitting: Physician Assistant

## 2017-07-24 ENCOUNTER — Other Ambulatory Visit: Payer: Self-pay | Admitting: Physician Assistant

## 2017-07-24 MED ORDER — TOPIRAMATE 50 MG PO TABS: ORAL_TABLET | ORAL | 1 refills | Status: DC

## 2017-07-25 ENCOUNTER — Encounter: Payer: Self-pay | Admitting: Physician Assistant

## 2017-07-26 ENCOUNTER — Encounter: Payer: Self-pay | Admitting: Physician Assistant

## 2017-09-08 ENCOUNTER — Other Ambulatory Visit: Payer: Self-pay | Admitting: Physician Assistant

## 2017-09-14 ENCOUNTER — Other Ambulatory Visit: Payer: Self-pay | Admitting: Physician Assistant

## 2017-09-19 ENCOUNTER — Other Ambulatory Visit: Payer: Self-pay | Admitting: Physician Assistant

## 2017-10-04 ENCOUNTER — Other Ambulatory Visit: Payer: Self-pay | Admitting: Internal Medicine

## 2017-10-15 ENCOUNTER — Other Ambulatory Visit: Payer: Self-pay | Admitting: Physician Assistant

## 2017-10-15 DIAGNOSIS — F341 Dysthymic disorder: Secondary | ICD-10-CM

## 2017-10-15 MED ORDER — ALPRAZOLAM 0.5 MG PO TABS: ORAL_TABLET | ORAL | 0 refills | Status: DC

## 2017-10-19 ENCOUNTER — Encounter: Payer: Self-pay | Admitting: Physician Assistant

## 2017-11-05 ENCOUNTER — Other Ambulatory Visit: Payer: Self-pay | Admitting: Physician Assistant

## 2017-11-05 ENCOUNTER — Encounter: Payer: Self-pay | Admitting: Physician Assistant

## 2017-11-05 MED ORDER — PHENTERMINE HCL 37.5 MG PO TABS: 37.5000 mg | ORAL_TABLET | Freq: Every day | ORAL | 2 refills | Status: DC

## 2017-11-27 ENCOUNTER — Other Ambulatory Visit: Payer: Self-pay | Admitting: *Deleted

## 2017-11-27 MED ORDER — ALLOPURINOL 300 MG PO TABS: 300.0000 mg | ORAL_TABLET | Freq: Every day | ORAL | 1 refills | Status: DC

## 2017-12-16 ENCOUNTER — Other Ambulatory Visit: Payer: Self-pay | Admitting: Physician Assistant

## 2017-12-20 ENCOUNTER — Other Ambulatory Visit: Payer: Self-pay | Admitting: Internal Medicine

## 2017-12-28 ENCOUNTER — Other Ambulatory Visit: Payer: Self-pay | Admitting: Internal Medicine

## 2017-12-28 ENCOUNTER — Encounter: Payer: Self-pay | Admitting: Internal Medicine

## 2017-12-28 MED ORDER — ACYCLOVIR 400 MG PO TABS: ORAL_TABLET | ORAL | 1 refills | Status: DC

## 2018-01-14 ENCOUNTER — Encounter: Payer: Self-pay | Admitting: Physician Assistant

## 2018-02-17 NOTE — Progress Notes (Signed)
Complete Physical  Assessment and Plan: Essential hypertension continue medications, DASH diet, exercise and monitor at home. Call if greater than 130/80. - CBC with Differential/Platelet - BASIC METABOLIC PANEL WITH GFR - Hepatic function panel  - TSH - Urinalysis, Routine w reflex microscopic (not at [facility]) - Microalbumin / creatinine urine ratio - EKG 12-Lead   Anemia, unspecified anemia type - , continue iron supp with Vitamin C and increase green leafy veggies   Asthma, unspecified asthma severity, uncomplicated Continue singulair, albuterol PRN, avoid triggers.   Allergy, subsequent encounter Continue OTC allergy pills  Hypokalemia - Magnesium   Hyperlipidemia - Lipid panel  Vitamin D deficiency - Vit D  25 hydroxy (rtn osteoporosis monitoring)  Anxiety -     Discontinue: buPROPion (WELLBUTRIN XL) 150 MG 24 hr tablet; Take 2 tablets (300 mg total) by mouth every morning.  Chronic gout without tophus, unspecified cause, unspecified site -     Uric acid  Overweight Continue topamax May restart phentermine in Nov or so  Discussed med's effects and SE's. Screening labs and tests as requested with regular follow-up as recommended. Future Appointments  Date Time Provider [place]  02/25/2019  9:30 AM Vicie Mutters, PA-C [id number] None    HPI 57 y.o. female  presents for a complete physical. Her blood pressure has been controlled at home, she is on lasix 20, atenolol 100mg  and micardis 80mg  today their BP is BP: 132/80 She does workout, walks the stairs at work. She denies chest pain, shortness of breath, dizziness.  She is not on cholesterol medication and denies myalgias. Her cholesterol is at goal. The cholesterol last visit was:   Lab Results  Component Value Date   CHOL 170 07/17/2017   HDL 71 07/17/2017   LDLCALC 83 07/17/2017   TRIG 77 07/17/2017   CHOLHDL 2.4 07/17/2017   Last A1C in the office was:  Lab Results  Component Value Date   HGBA1C 5.3 11/29/2013   Patient is on Vitamin D supplement.   Lab Results  Component Value Date   VD25OH 41 07/17/2017   Patient is on allopurinol for gout and does not report a recent flare, she is on 1/2 of the allopurinol.   Lab Results  Component Value Date   LABURIC 3.3 01/14/2017   BMI is Body mass index is 27.57 kg/m., she is working on diet and exercise. She has been on phentermine but it is not helping. She is on topamax at night.  Wt Readings from Last 3 Encounters:  02/22/18 153 lb 3.2 oz (69.5 kg)  07/17/17 152 lb (68.9 kg)  01/14/17 138 lb 12.8 oz (63 kg)   Current Medications:  Current Outpatient Medications on File Prior to Visit  Medication Sig  . acyclovir (ZOVIRAX) 400 MG tablet Take 1 tablet 2 x/day for Fever Blisters  . allopurinol (ZYLOPRIM) 300 MG tablet Take 1 tablet (300 mg total) by mouth daily.  Marland Kitchen ALPRAZolam (XANAX) 0.5 MG tablet 1 pill AS NEEDED 3 days a week, work very hard to take only as needed- needs to last longer than a month  . Ascorbic Acid (VITAMIN C PO) Take by mouth daily.  Marland Kitchen atenolol (TENORMIN) 100 MG tablet TAKE 1 TABLET BY MOUTH  DAILY  . cholecalciferol (VITAMIN D) 1000 units tablet Take 1,000 Units by mouth daily.  . furosemide (LASIX) 40 MG tablet TAKE 1 TABLET BY MOUTH  DAILY  . MAGNESIUM PO Take 400 mg by mouth daily.   . milk thistle 175 MG  tablet Take 175 mg by mouth daily.  . montelukast (SINGULAIR) 10 MG tablet TAKE 1 TABLET BY MOUTH AT  BEDTIME  . Omega-3 Fatty Acids (FISH OIL) 1000 MG CAPS Take 1,000 mg by mouth daily.  Marland Kitchen omeprazole (PRILOSEC) 20 MG capsule Take 20 mg by mouth daily.   . phentermine (ADIPEX-P) 37.5 MG tablet Take 1 tablet (37.5 mg total) by mouth daily before breakfast.  . PORTIA-28 0.15-30 MG-MCG tablet TAKE 1 TABLET BY MOUTH  DAILY  . telmisartan (MICARDIS) 80 MG tablet TAKE 1 TABLET BY MOUTH  DAILY  . topiramate (TOPAMAX) 50 MG tablet 1/2-1 PILL AT NIGHT WITH DINNER OR AT BED TIME  . traZODone (DESYREL) 50  MG tablet TAKE 1/2-1 TABLET FOR SLEEP   No current facility-administered medications on file prior to visit.     Patient Active Problem List   Diagnosis Date Noted  . Gout 12/12/2015  . Hyperlipidemia 12/12/2015  . Vitamin D deficiency 12/12/2015  . Hypokalemia 07/15/2013  . Hypertension   . Allergy   . Anemia   . Anxiety   . Asthma     Health Maintenance:   Immunization History  Administered Date(s) Administered  . Influenza-Unspecified 04/09/2016, 04/18/2017  . Pneumococcal Polysaccharide-23 06/30/1996  . Tdap 11/16/2012   Tetanus: 2014 Pneumovax: 1998 Prevnar 13: declines Flu vaccine: 2018 Zostavax: N/A  Pap: 2015 Negative due 5 years, never abnormal test MGM: 04/2017 DEXA: N/A Colonoscopy: 2011 due 2021 EGD: N/A  Allergies Allergies  Allergen Reactions  . Ciprofloxacin Nausea Only    Headache  . Penicillins     Allergy has been more than 73yrs but unsure of what reaction was    SURGICAL HISTORY She  has no past surgical history on file. FAMILY HISTORY Her family history includes Cataracts in her mother; Diabetes in her father and mother; Heart disease in her father; Hypertension in her father and mother; Thyroid disease in her mother. SOCIAL HISTORY She  reports that she has never smoked. She has never used smokeless tobacco. She reports that she drinks about 14.0 standard drinks of alcohol per week. She reports that she does not use drugs. Will drink 2 glasses of wine a day.   Review of Systems  Constitutional: Negative.   HENT: Negative.   Eyes: Negative.   Respiratory: Negative.   Cardiovascular: Negative.   Gastrointestinal: Negative.   Genitourinary: Negative.   Musculoskeletal: Negative for back pain, falls, joint pain, myalgias and neck pain.  Skin: Negative.   Neurological: Negative.   Endo/Heme/Allergies: Negative.   Psychiatric/Behavioral: Negative for depression, hallucinations, memory loss, substance abuse and suicidal ideas. The  patient is not nervous/anxious and does not have insomnia.      Physical Exam: Estimated body mass index is 27.57 kg/m as calculated from the following:   Height as of this encounter: 5' 2.5" (1.588 m).   Weight as of this encounter: 153 lb 3.2 oz (69.5 kg). BP 132/80   Pulse 64   Temp 97.9 F (36.6 C)   Resp 14   Ht 5' 2.5" (1.588 m)   Wt 153 lb 3.2 oz (69.5 kg)   SpO2 97%   BMI 27.57 kg/m  General Appearance: Well nourished, in no apparent distress. Eyes: PERRLA, EOMs, conjunctiva no swelling or erythema, normal fundi and vessels. Sinuses: No Frontal/maxillary tenderness ENT/Mouth: Ext aud canals clear, normal light reflex with TMs without erythema, bulging.  Good dentition. No erythema, swelling, or exudate on post pharynx. Tonsils not swollen or erythematous. Hearing normal.  Neck:  Supple, thyroid normal. No bruits Respiratory: Respiratory effort normal, BS equal bilaterally without rales, rhonchi, wheezing or stridor. Cardio: RRR without murmurs, rubs or gallops. Brisk peripheral pulses without edema.  Chest: symmetric, with normal excursions and percussion. Breasts: Symmetric, without lumps, nipple discharge, retractions. Abdomen: Soft, +BS. Non tender, no guarding, rebound, hernias, masses, or organomegaly. .  Lymphatics: Non tender without lymphadenopathy.  Genitourinary: defer Musculoskeletal: Full ROM all peripheral extremities,5/5 strength, and normal gait. Skin: Warm, dry without rashes, lesions, ecchymosis.  Neuro: Cranial nerves intact, reflexes equal bilaterally. Normal muscle tone, no cerebellar symptoms. Sensation intact.  Psych: Awake and oriented X 3, normal affect, Insight and Judgment appropriate.   EKG: WNL no changes. AORTA SCAN: N/A   Vicie Mutters 11:04 AM

## 2018-02-22 ENCOUNTER — Encounter: Payer: Self-pay | Admitting: Physician Assistant

## 2018-02-22 ENCOUNTER — Ambulatory Visit (INDEPENDENT_AMBULATORY_CARE_PROVIDER_SITE_OTHER): Payer: 59 | Admitting: Physician Assistant

## 2018-02-22 VITALS — BP 132/80 | HR 64 | Temp 97.9°F | Resp 14 | Ht 62.5 in | Wt 153.2 lb

## 2018-02-22 DIAGNOSIS — D649 Anemia, unspecified: Secondary | ICD-10-CM

## 2018-02-22 DIAGNOSIS — Z0000 Encounter for general adult medical examination without abnormal findings: Secondary | ICD-10-CM

## 2018-02-22 DIAGNOSIS — Z6827 Body mass index (BMI) 27.0-27.9, adult: Secondary | ICD-10-CM

## 2018-02-22 DIAGNOSIS — Z136 Encounter for screening for cardiovascular disorders: Secondary | ICD-10-CM | POA: Diagnosis not present

## 2018-02-22 DIAGNOSIS — Z131 Encounter for screening for diabetes mellitus: Secondary | ICD-10-CM

## 2018-02-22 DIAGNOSIS — I10 Essential (primary) hypertension: Secondary | ICD-10-CM

## 2018-02-22 DIAGNOSIS — F419 Anxiety disorder, unspecified: Secondary | ICD-10-CM

## 2018-02-22 DIAGNOSIS — J45909 Unspecified asthma, uncomplicated: Secondary | ICD-10-CM

## 2018-02-22 DIAGNOSIS — E876 Hypokalemia: Secondary | ICD-10-CM

## 2018-02-22 DIAGNOSIS — E785 Hyperlipidemia, unspecified: Secondary | ICD-10-CM

## 2018-02-22 DIAGNOSIS — M1A9XX0 Chronic gout, unspecified, without tophus (tophi): Secondary | ICD-10-CM

## 2018-02-22 DIAGNOSIS — E559 Vitamin D deficiency, unspecified: Secondary | ICD-10-CM

## 2018-02-22 DIAGNOSIS — Z0001 Encounter for general adult medical examination with abnormal findings: Secondary | ICD-10-CM

## 2018-02-22 DIAGNOSIS — T7840XD Allergy, unspecified, subsequent encounter: Secondary | ICD-10-CM

## 2018-02-22 NOTE — Patient Instructions (Addendum)
Check out  Mini habits for weight loss book  2 apps for tracking food is myfitness pal  loseit OR can take picture of your food  Your ears and sinuses are connected by the eustachian tube. When your sinuses are inflamed, this can close off the tube and cause fluid to collect in your middle ear. This can then cause dizziness, popping, clicking, ringing, and echoing in your ears. This is often NOT an infection and does NOT require antibiotics, it is caused by inflammation so the treatments help the inflammation. This can take a long time to get better so please be patient.  Here are things you can do to help with this: - Try the Flonase or Nasonex. Remember to spray each nostril twice towards the outer part of your eye.  Do not sniff but instead pinch your nose and tilt your head back to help the medicine get into your sinuses.  The best time to do this is at bedtime.Stop if you get blurred vision or nose bleeds.  -While drinking fluids, pinch and hold nose close and swallow, to help open eustachian tubes to drain fluid behind ear drums. -Please pick one of the over the counter allergy medications below and take it once daily for allergies.  It will also help with fluid behind ear drums. Claritin or loratadine cheapest but likely the weakest  Zyrtec or certizine at night because it can make you sleepy The strongest is allegra or fexafinadine  Cheapest at [employer], [employer], [employer] -can use decongestant over the counter, please do not use if you have high blood pressure or certain heart conditions.   if worsening HA, changes vision/speech, imbalance, weakness go to the ER  VAGINAL DRYNESS OVERVIEW  Vaginal dryness, also known as atrophic vaginitis, is a common condition in postmenopausal women. This condition is also common in women who have had both ovaries removed at the time of hysterectomy.   Some women have uncomfortable symptoms of vaginal dryness, such as pain with sex, burning vaginal  discomfort or itching, or abnormal vaginal discharge, while others have no symptoms at all.  VAGINAL DRYNESS CAUSES   Estrogen helps to keep the vagina moist and to maintain thickness of the vaginal lining. Vaginal dryness occurs when the ovaries produce a decreased amount of estrogen. This can occur at certain times in a woman's life, and may be permanent or temporary. Times when less estrogen is made include: ?At the time of menopause. ?After surgical removal of the ovaries, chemotherapy, or radiation therapy of the pelvis for cancer. ?After having a baby, particularly in women who breastfeed. ?While using certain medications, such as danazol, medroxyprogesterone (brand names: Provera or DepoProvera), leuprolide (brand name: Lupron), or nafarelin. When these medications are stopped, estrogen production resumes.  Women who smoke cigarettes have been shown to have an increased risk of an earlier menopause transition as compared to non-smokers. Therefore, atrophic vaginitis symptoms may appear at a younger age in this population.  VAGINAL DRYNESS TREATMENT   There are three treatment options for women with vaginal dryness:  Vaginal lubricants and moisturizers - Vaginal lubricants and moisturizers can be purchased without a prescription. These products do not contain any hormones and have virtually no side effects. - Albolene is found in the facial cleanser section at [facility], [facility], or [employer]. It is a large jar with a blue top. This is the best lubricant for women because it is hypoallergenic. -Natural lubricants, such as olive, avocado or peanut oil, are easily available products that  may be used as a lubricant with sex.  -Vaginal moisturizes (eg, Replens, Moist Again, Vagisil, K-Y Silk-E, and Feminease) are formulated to allow water to be retained in the vaginal tissues. Moisturizers are applied into the vagina three times weekly to allow a continued moisturizing effect. These should not be used  just before having sex, as they can be irritating.  Vaginal estrogen - Vaginal estrogen is the most effective treatment option for women with vaginal dryness. Vaginal estrogen must be prescribed by a healthcare provider. Very low doses of vaginal estrogen can be used when it is put into the vagina to treat vaginal dryness. A small amount of estrogen is absorbed into the bloodstream, but only about 100 times less than when using estrogen pills or tablets. As a result, there is a much lower risk of side effects, such as blood clots, breast cancer, and heart attack, compared with other estrogen-containing products (birth control pills, menopausal hormone therapy).   Ospemifene - Ospemifene is a prescription medication that is similar to estrogen, but is not estrogen. In the vaginal tissue, it acts similarly to estrogen. In the breast tissue, it acts as an estrogen blocker. It comes in a pill, and is prescribed for women who want to use an estrogen-like medication for vaginal dryness or painful sex associated with vaginal dryness, but prefer not to use a vaginal medication. The medication may cause hot flashes as a side effect. This type of medication may increase the risk of blood clots or uterine cancer. Further study of ospemifene is needed to evaluate the risk of these complications. This medication has not been tested in women who have had breast cancer or are at a high risk of developing breast cancer.    Sexual activity - Vaginal estrogen improves vaginal dryness quickly, usually within a few weeks. You may continue to have sex as you treat vaginal dryness because sex itself can help to keep the vaginal tissues healthy. Vaginal intercourse may help the vaginal tissues by keeping them soft and stretchable and preventing the tissues from shrinking.  If sex continues to be painful despite treatment for vaginal dryness, talk to your healthcare provider.     Are you an emotional eater? Do you eat more  when you're feeling stressed? Do you eat when you're not hungry or when you're full? Do you eat to feel better (to calm and soothe yourself when you're sad, mad, bored, anxious, etc.)? Do you reward yourself with food? Do you regularly eat until you've stuffed yourself? Does food make you feel safe? Do you feel like food is a friend? Do you feel powerless or out of control around food?  If you answered yes to some of these questions than it is likely that you are an emotional eater. This is normally a learned behavior and can take time to first recognize the signs and second BREAK THE HABIT. But here is more information and tips to help.   The difference between emotional hunger and physical hunger Emotional hunger can be powerful, so it's easy to mistake it for physical hunger. But there are clues you can look for to help you tell physical and emotional hunger apart.  Emotional hunger comes on suddenly. It hits you in an instant and feels overwhelming and urgent. Physical hunger, on the other hand, comes on more gradually. The urge to eat doesn't feel as dire or demand instant satisfaction (unless you haven't eaten for a very long time).  Emotional hunger craves specific comfort foods.  When you're physically hungry, almost anything sounds good-including healthy stuff like vegetables. But emotional hunger craves junk food or sugary snacks that provide an instant rush. You feel like you need cheesecake or pizza, and nothing else will do.  Emotional hunger often leads to mindless eating. Before you know it, you've eaten a whole bag of chips or an entire pint of ice cream without really paying attention or fully enjoying it. When you're eating in response to physical hunger, you're typically more aware of what you're doing.  Emotional hunger isn't satisfied once you're full. You keep wanting more and more, often eating until you're uncomfortably stuffed. Physical hunger, on the other hand, doesn't  need to be stuffed. You feel satisfied when your stomach is full.  Emotional hunger isn't located in the stomach. Rather than a growling belly or a pang in your stomach, you feel your hunger as a craving you can't get out of your head. You're focused on specific textures, tastes, and smells.  Emotional hunger often leads to regret, guilt, or shame. When you eat to satisfy physical hunger, you're unlikely to feel guilty or ashamed because you're simply giving your body what it needs. If you feel guilty after you eat, it's likely because you know deep down that you're not eating for nutritional reasons.  Identify your emotional eating triggers What situations, places, or feelings make you reach for the comfort of food? Most emotional eating is linked to unpleasant feelings, but it can also be triggered by positive emotions, such as rewarding yourself for achieving a goal or celebrating a holiday or happy event. Common causes of emotional eating include:  Stuffing emotions - Eating can be a way to temporarily silence or "stuff down" uncomfortable emotions, including anger, fear, sadness, anxiety, loneliness, resentment, and shame. While you're numbing yourself with food, you can avoid the difficult emotions you'd rather not feel.  Boredom or feelings of emptiness - Do you ever eat simply to give yourself something to do, to relieve boredom, or as a way to fill a void in your life? You feel unfulfilled and empty, and food is a way to occupy your mouth and your time. In the moment, it fills you up and distracts you from underlying feelings of purposelessness and dissatisfaction with your life.  Childhood habits - Think back to your childhood memories of food. Did your parents reward good behavior with ice cream, take you out for pizza when you got a good report card, or serve you sweets when you were feeling sad? These habits can often carry over into adulthood. Or your eating may be driven by nostalgia-for  cherished memories of grilling burgers in the backyard with your dad or baking and eating cookies with your mom.  Social influences - Getting together with other people for a meal is a great way to relieve stress, but it can also lead to overeating. It's easy to overindulge simply because the food is there or because everyone else is eating. You may also overeat in social situations out of nervousness. Or perhaps your family or circle of friends encourages you to overeat, and it's easier to go along with the group.  Stress - Ever notice how stress makes you hungry? It's not just in your mind. When stress is chronic, as it so often is in our chaotic, fast-paced world, your body produces high levels of the stress hormone, cortisol. Cortisol triggers cravings for salty, sweet, and fried foods-foods that give you a burst of energy and  pleasure. The more uncontrolled stress in your life, the more likely you are to turn to food for emotional relief.  Find other ways to feed your feelings If you don't know how to manage your emotions in a way that doesn't involve food, you won't be able to control your eating habits for very long. Diets so often fail because they offer logical nutritional advice which only works if you have conscious control over your eating habits. It doesn't work when emotions hijack the process, demanding an immediate payoff with food.  In order to stop emotional eating, you have to find other ways to fulfill yourself emotionally. It's not enough to understand the cycle of emotional eating or even to understand your triggers, although that's a huge first step. You need alternatives to food that you can turn to for emotional fulfillment.  Alternatives to emotional eating If you're depressed or lonely, call someone who always makes you feel better, play with your dog or cat, or look at a favorite photo or cherished memento.  If you're anxious, expend your nervous energy by dancing to your  favorite song, squeezing a stress ball, or taking a brisk walk.  If you're exhausted, treat yourself with a hot cup of tea, take a bath, light some scented candles, or wrap yourself in a warm blanket.  If you're bored, read a good book, watch a comedy show, explore the outdoors, or turn to an activity you enjoy (woodworking, playing the guitar, shooting hoops, scrapbooking, etc.).  What is mindful eating? Mindful eating is a practice that develops your awareness of eating habits and allows you to pause between your triggers and your actions. Most emotional eaters feel powerless over their food cravings. When the urge to eat hits, you feel an almost unbearable tension that demands to be fed, right now. Because you've tried to resist in the past and failed, you believe that your willpower just isn't up to snuff. But the truth is that you have more power over your cravings than you think.  Take 5 before you give in to a craving Emotional eating tends to be automatic and virtually mindless. Before you even realize what you're doing, you've reached for a tub of ice cream and polished off half of it. But if you can take a moment to pause and reflect when you're hit with a craving, you give yourself the opportunity to make a different decision.  Can you put off eating for five minutes? Or just start with one minute. Don't tell yourself you can't give in to the craving; remember, the forbidden is extremely tempting. Just tell yourself to wait.  While you're waiting, check in with yourself. How are you feeling? What's going on emotionally? Even if you end up eating, you'll have a better understanding of why you did it. This can help you set yourself up for a different response next time.  How to practice mindful eating Eating while you're also doing other things-such as watching TV, driving, or playing with your phone-can prevent you from fully enjoying your food. Since your mind is elsewhere, you may not  feel satisfied or continue eating even though you're no longer hungry. Eating more mindfully can help focus your mind on your food and the pleasure of a meal and curb overeating.   Eat your meals in a calm place with no distractions, aside from any dining companions.  Try eating with your non-dominant hand or using chopsticks instead of a knife and fork. Eating in  such a non-familiar way can slow down how fast you eat and ensure your mind stays focused on your food.  Allow yourself enough time not to have to rush your meal. Set a timer for 20 minutes and pace yourself so you spend at least that much time eating.  Take small bites and chew them well, taking time to notice the different flavors and textures of each mouthful.  Put your utensils down between bites. Take time to consider how you feel-hungry, satiated-before picking up your utensils again.  Try to stop eating before you are full.It takes time for the signal to reach your brain that you've had enough. Don't feel obligated to always clean your plate.  When you've finished your food, take a few moments to assess if you're really still hungry before opting for an extra serving or dessert.  Learn to accept your feelings-even the bad ones  While it may seem that the core problem is that you're powerless over food, emotional eating actually stems from feeling powerless over your emotions. You don't feel capable of dealing with your feelings head on, so you avoid them with food.  Recommended reading  Mini Habits for weight loss  Healthy Eating: A guide to the new nutrition - [place] Report  10 Tips for Mindful Eating - How mindfulness can help you fully enjoy a meal and the experience of eating-with moderation and restraint. ([place])  Weight Loss: Gain Control of Emotional Eating - Tips to regain control of your eating habits. [facility])  Why Stress Causes People to Overeat -Tips on  controlling stress eating. ([place])  Mindful Eating Meditations -Free online mindfulness meditations. ([facility] [employer])

## 2018-02-23 LAB — CBC WITH DIFFERENTIAL/PLATELET
BASOS ABS: 115 {cells}/uL (ref 0–200)
Basophils Relative: 1 %
EOS PCT: 2.6 %
Eosinophils Absolute: 299 cells/uL (ref 15–500)
HEMATOCRIT: 37.3 % (ref 35.0–45.0)
HEMOGLOBIN: 12.7 g/dL (ref 11.7–15.5)
LYMPHS ABS: 3370 {cells}/uL (ref 850–3900)
MCH: 33.4 pg — ABNORMAL HIGH (ref 27.0–33.0)
MCHC: 34 g/dL (ref 32.0–36.0)
MCV: 98.2 fL (ref 80.0–100.0)
MPV: 10.5 fL (ref 7.5–12.5)
Monocytes Relative: 7.3 %
NEUTROS ABS: 6877 {cells}/uL (ref 1500–7800)
NEUTROS PCT: 59.8 %
Platelets: 320 10*3/uL (ref 140–400)
RBC: 3.8 10*6/uL (ref 3.80–5.10)
RDW: 12.1 % (ref 11.0–15.0)
Total Lymphocyte: 29.3 %
WBC mixed population: 840 cells/uL (ref 200–950)
WBC: 11.5 10*3/uL — AB (ref 3.8–10.8)

## 2018-02-23 LAB — URINALYSIS, ROUTINE W REFLEX MICROSCOPIC
BILIRUBIN URINE: NEGATIVE
Bacteria, UA: NONE SEEN /HPF
Glucose, UA: NEGATIVE
HGB URINE DIPSTICK: NEGATIVE
HYALINE CAST: NONE SEEN /LPF
Ketones, ur: NEGATIVE
Nitrite: NEGATIVE
PROTEIN: NEGATIVE
Specific Gravity, Urine: 1.011 (ref 1.001–1.03)
pH: 7.5 (ref 5.0–8.0)

## 2018-02-23 LAB — MICROALBUMIN / CREATININE URINE RATIO
CREATININE, URINE: 57 mg/dL (ref 20–275)
MICROALB/CREAT RATIO: 4 ug/mg{creat} (ref <30)
Microalb, Ur: 0.2 mg/dL

## 2018-02-23 LAB — IRON, TOTAL/TOTAL IRON BINDING CAP
%SAT: 41 % (calc) (ref 16–45)
IRON: 155 ug/dL (ref 45–160)
TIBC: 375 mcg/dL (calc) (ref 250–450)

## 2018-02-23 LAB — COMPLETE METABOLIC PANEL WITH GFR
AG Ratio: 1.6 (calc) (ref 1.0–2.5)
ALKALINE PHOSPHATASE (APISO): 53 U/L (ref 33–130)
ALT: 23 U/L (ref 6–29)
AST: 25 U/L (ref 10–35)
Albumin: 4.3 g/dL (ref 3.6–5.1)
BUN: 15 mg/dL (ref 7–25)
CALCIUM: 9.4 mg/dL (ref 8.6–10.4)
CO2: 29 mmol/L (ref 20–32)
CREATININE: 0.9 mg/dL (ref 0.50–1.05)
Chloride: 101 mmol/L (ref 98–110)
GFR, Est African American: 82 mL/min/{1.73_m2} (ref >=60)
GFR, Est Non African American: 70 mL/min/{1.73_m2} (ref >=60)
Globulin: 2.7 g/dL (calc) (ref 1.9–3.7)
Glucose, Bld: 88 mg/dL (ref 65–99)
POTASSIUM: 3.5 mmol/L (ref 3.5–5.3)
Sodium: 140 mmol/L (ref 135–146)
Total Bilirubin: 0.5 mg/dL (ref 0.2–1.2)
Total Protein: 7 g/dL (ref 6.1–8.1)

## 2018-02-23 LAB — HEMOGLOBIN A1C
EAG (MMOL/L): 5.5 (calc)
Hgb A1c MFr Bld: 5.1 % of total Hgb (ref <5.7)
MEAN PLASMA GLUCOSE: 100 (calc)

## 2018-02-23 LAB — LIPID PANEL
CHOLESTEROL: 195 mg/dL (ref <200)
HDL: 64 mg/dL (ref >50)
LDL CHOLESTEROL (CALC): 109 mg/dL — AB
Non-HDL Cholesterol (Calc): 131 mg/dL (calc) — ABNORMAL HIGH (ref <130)
Total CHOL/HDL Ratio: 3 (calc) (ref <5.0)
Triglycerides: 108 mg/dL (ref <150)

## 2018-02-23 LAB — VITAMIN D 25 HYDROXY (VIT D DEFICIENCY, FRACTURES): Vit D, 25-Hydroxy: 57 ng/mL (ref 30–100)

## 2018-02-23 LAB — TSH: TSH: 1.18 m[IU]/L (ref 0.40–4.50)

## 2018-02-23 LAB — URIC ACID: Uric Acid, Serum: 3.7 mg/dL (ref 2.5–7.0)

## 2018-02-23 LAB — MAGNESIUM: Magnesium: 2 mg/dL (ref 1.5–2.5)

## 2018-02-28 ENCOUNTER — Other Ambulatory Visit: Payer: Self-pay

## 2018-02-28 MED ORDER — TELMISARTAN 80 MG PO TABS: 80.0000 mg | ORAL_TABLET | Freq: Every day | ORAL | 1 refills | Status: DC

## 2018-03-22 ENCOUNTER — Telehealth: Payer: Self-pay

## 2018-03-22 MED ORDER — TELMISARTAN 80 MG PO TABS: 80.0000 mg | ORAL_TABLET | Freq: Every day | ORAL | 1 refills | Status: DC

## 2018-03-22 NOTE — Telephone Encounter (Signed)
Patient requesting Telmisartan be sent to local pharmacy, [facility] on backorder.

## 2018-03-30 ENCOUNTER — Other Ambulatory Visit: Payer: Self-pay

## 2018-03-30 MED ORDER — ATENOLOL 100 MG PO TABS: 100.0000 mg | ORAL_TABLET | Freq: Every day | ORAL | 1 refills | Status: DC

## 2018-03-30 NOTE — Telephone Encounter (Signed)
Atenolol refill request

## 2018-04-05 ENCOUNTER — Other Ambulatory Visit: Payer: Self-pay

## 2018-04-05 DIAGNOSIS — F341 Dysthymic disorder: Secondary | ICD-10-CM

## 2018-04-05 MED ORDER — ALPRAZOLAM 0.5 MG PO TABS: ORAL_TABLET | ORAL | 0 refills | Status: DC

## 2018-04-08 ENCOUNTER — Other Ambulatory Visit: Payer: Self-pay | Admitting: Internal Medicine

## 2018-04-21 MED ORDER — PHENTERMINE HCL 37.5 MG PO TABS: 37.5000 mg | ORAL_TABLET | Freq: Every day | ORAL | 0 refills | Status: DC

## 2018-05-18 ENCOUNTER — Other Ambulatory Visit: Payer: Self-pay | Admitting: Internal Medicine

## 2018-05-19 ENCOUNTER — Ambulatory Visit (INDEPENDENT_AMBULATORY_CARE_PROVIDER_SITE_OTHER): Payer: 59 | Admitting: Physician Assistant

## 2018-05-19 ENCOUNTER — Encounter: Payer: Self-pay | Admitting: Physician Assistant

## 2018-05-19 VITALS — BP 130/78 | HR 81 | Temp 97.5°F | Resp 16 | Ht 62.5 in | Wt 154.4 lb

## 2018-05-19 DIAGNOSIS — I10 Essential (primary) hypertension: Secondary | ICD-10-CM

## 2018-05-19 DIAGNOSIS — Z6827 Body mass index (BMI) 27.0-27.9, adult: Secondary | ICD-10-CM | POA: Diagnosis not present

## 2018-05-19 MED ORDER — PHENTERMINE HCL 37.5 MG PO TABS: 37.5000 mg | ORAL_TABLET | Freq: Every day | ORAL | 2 refills | Status: DC

## 2018-05-19 NOTE — Patient Instructions (Signed)
WATER IS IMPORTANT  Being dehydrated can hurt your kidneys, cause fatigue, headaches, muscle aches, joint pain, and dry skin/nails so please increase your fluids.   Drink 80-100 oz a day of water, measure it out! Eat 3 meals a day, have to do breakfast, eat protein- hard boiled eggs, protein bar like nature valley protein bar, greek yogurt like oikos triple zero, chobani 100, or light n fit greek  Can check out plantnanny app on your phone to help you keep track of your water  Use smaller plates for portion sizes.        Google mindful eating and here are some tips and tricks below.   Rate your hunger before you eat on a scale of 1-10, try to eat closer to a 6 or higher. And if you are at below that, why are you eating? Slow down and listen to your body.

## 2018-05-19 NOTE — Progress Notes (Signed)
57 y.o.female presents for a follow up after being on phentermine for weight loss.  While on the medication they have lost 10 lbs since last visit. They deny palpitations, anxiety, trouble sleeping, elevated BP.   BP Readings from Last 3 Encounters:  05/19/18 130/78  02/22/18 132/80  07/17/17 132/80    BMI is Body mass index is 27.79 kg/m., she is working on diet and exercise. She states she had gained 10 lbs on her scale at home, has been on it a month and is down on her scale at home.  Wt Readings from Last 3 Encounters:  05/19/18 154 lb 6.4 oz (70 kg)  02/22/18 153 lb 3.2 oz (69.5 kg)  07/17/17 152 lb (68.9 kg)    Medications: Current Outpatient Medications on File Prior to Visit  Medication Sig Dispense Refill  . acyclovir (ZOVIRAX) 400 MG tablet Take 1 tablet 2 x/day for Fever Blisters 180 tablet 1  . allopurinol (ZYLOPRIM) 300 MG tablet Take 1 tablet (300 mg total) by mouth daily. 90 tablet 1  . ALPRAZolam (XANAX) 0.5 MG tablet 1 pill AS NEEDED 3 days a week, work very hard to take only as needed- needs to last longer than a month 90 tablet 0  . Ascorbic Acid (VITAMIN C PO) Take by mouth daily.    Marland Kitchen atenolol (TENORMIN) 100 MG tablet Take 1 tablet (100 mg total) by mouth daily. 90 tablet 1  . cholecalciferol (VITAMIN D) 1000 units tablet Take 1,000 Units by mouth daily.    . furosemide (LASIX) 40 MG tablet TAKE 1 TABLET BY MOUTH  DAILY 90 tablet 1  . MAGNESIUM PO Take 400 mg by mouth daily.     . milk thistle 175 MG tablet Take 175 mg by mouth daily.    . montelukast (SINGULAIR) 10 MG tablet TAKE 1 TABLET BY MOUTH AT  BEDTIME 90 tablet 3  . Omega-3 Fatty Acids (FISH OIL) 1000 MG CAPS Take 1,000 mg by mouth daily.    Marland Kitchen omeprazole (PRILOSEC) 20 MG capsule Take 20 mg by mouth daily.     . phentermine (ADIPEX-P) 37.5 MG tablet Take 1 tablet (37.5 mg total) by mouth daily before breakfast. 30 tablet 0  . PORTIA-28 0.15-30 MG-MCG tablet TAKE 1 TABLET BY MOUTH  DAILY 84 tablet 3  .  telmisartan (MICARDIS) 80 MG tablet Take 1 tablet (80 mg total) by mouth daily. 90 tablet 1  . topiramate (TOPAMAX) 50 MG tablet 1/2-1 PILL AT NIGHT WITH DINNER OR AT BED TIME 30 tablet 5  . traZODone (DESYREL) 50 MG tablet TAKE 1/2-1 TABLET FOR SLEEP 90 tablet 1   No current facility-administered medications on file prior to visit.     ROS: All negative except for above  Physical exam: Vitals:   05/19/18 0926  BP: 130/78  Pulse: 81  Resp: 16  Temp: (!) 97.5 F (36.4 C)  SpO2: 98%   Physical Exam  Constitutional: She is oriented to person, place, and time. She appears well-developed and well-nourished.  HENT:  Head: Normocephalic and atraumatic.  Right Ear: External ear normal.  Left Ear: External ear normal.  Mouth/Throat: Oropharynx is clear and moist.  Eyes: Pupils are equal, round, and reactive to light. Conjunctivae and EOM are normal.  Neck: Normal range of motion. Neck supple. No thyromegaly present.  Cardiovascular: Normal rate, regular rhythm and normal heart sounds. Exam reveals no gallop and no friction rub.  No murmur heard. Pulmonary/Chest: Effort normal and breath sounds normal. No respiratory distress.  She has no wheezes.  Abdominal: Soft. Bowel sounds are normal. She exhibits no distension and no mass. There is no tenderness. There is no rebound and no guarding.  Musculoskeletal: Normal range of motion.  Lymphadenopathy:    She has no cervical adenopathy.  Neurological: She is alert and oriented to person, place, and time. She displays normal reflexes. No cranial nerve deficit. Coordination normal.  Skin: Skin is warm and dry.  Psychiatric: She has a normal mood and affect.    Assessment: Obesity with co morbid conditions.   Plan: General weight loss/lifestyle modification strategies discussed (elicit support from others; identify saboteurs; non-food rewards, etc). Continue food diary Continue restricted calorie diet Continue daily exercise as well as  behavior modification such as walking further away, putting down the fork, having a plan, using stairs, etc.  Medication: phentermine  Follow up in 3 months Future Appointments  Date Time Provider [place]  08/27/2018  8:45 AM Vicie Mutters, PA-C [facility] None  02/25/2019  9:30 AM Vicie Mutters, PA-C [facility] None

## 2018-06-01 ENCOUNTER — Other Ambulatory Visit: Payer: Self-pay | Admitting: Physician Assistant

## 2018-06-01 MED ORDER — ALLOPURINOL 300 MG PO TABS: 150.0000 mg | ORAL_TABLET | Freq: Every day | ORAL | 1 refills | Status: DC

## 2018-06-02 MED ORDER — ALLOPURINOL 300 MG PO TABS: 300.0000 mg | ORAL_TABLET | Freq: Every day | ORAL | 1 refills | Status: DC

## 2018-06-03 ENCOUNTER — Telehealth: Payer: Self-pay | Admitting: Physician Assistant

## 2018-06-03 DIAGNOSIS — F341 Dysthymic disorder: Secondary | ICD-10-CM

## 2018-06-03 MED ORDER — ALPRAZOLAM 1 MG PO TABS: 0.5000 mg | ORAL_TABLET | Freq: Every day | ORAL | 0 refills | Status: DC | PRN

## 2018-06-03 NOTE — Telephone Encounter (Signed)
-----   Message from Elenor Quinones, [place] sent at 06/02/2018  4:37 PM EST ----- Regarding: refill XANAX on back order (0.5mg s)  The pharmacy has 0.25mg s and 1mg  Please advise?

## 2018-06-04 ENCOUNTER — Other Ambulatory Visit: Payer: Self-pay | Admitting: Adult Health

## 2018-06-30 ENCOUNTER — Other Ambulatory Visit: Payer: Self-pay | Admitting: Physician Assistant

## 2018-08-23 ENCOUNTER — Other Ambulatory Visit: Payer: Self-pay | Admitting: Internal Medicine

## 2018-08-23 ENCOUNTER — Other Ambulatory Visit: Payer: Self-pay | Admitting: Physician Assistant

## 2018-08-23 ENCOUNTER — Other Ambulatory Visit: Payer: Self-pay | Admitting: Adult Health

## 2018-08-23 DIAGNOSIS — F419 Anxiety disorder, unspecified: Secondary | ICD-10-CM

## 2018-08-26 NOTE — Progress Notes (Signed)
Assessment and Plan:  Essential hypertension - continue medications, DASH diet, exercise and monitor at home. Call if greater than 130/80.   Morbid Obesity with co morbidities - follow up 6 months for progress monitoring - increase veggies, decrease carbs - long discussion about weight loss, diet, and exercise -     phentermine (ADIPEX-P) 37.5 MG tablet; Take 1 tablet (37.5 mg total) by mouth daily before breakfast. - follow up at CPE  Hyperlipidemia, unspecified hyperlipidemia type -continue medications, check lipids, decrease fatty foods, increase activity. '  Insomnia, unspecified type -     traZODone (DESYREL) 50 MG tablet; 1/2-1 tablet for sleep [facility] - continue xanax PRN - refilled  Anxiety Willing to try effexor, did not do well with celexa and wellbutrin Refill xanax during this time, contact in 3 months   Continue diet and meds as discussed. Further disposition pending results of labs. Over 30 minutes of exam, counseling, chart review, and critical decision making was performed Future Appointments  Date Time Provider [place]  08/27/2018  8:45 AM Vicie Mutters, PA-C [facility] None  02/25/2019  9:30 AM Vicie Mutters, PA-C [facility] None    HPI 58 y.o. female  presents for 3 month follow up on hypertension, cholesterol, prediabetes, and vitamin D deficiency.   Her blood pressure has been controlled at home, she is on atenolol at night 50mg , today their BP is BP: 124/88   Mom passed 2017, dad passed Feb 11th, and she is still trying to find a job.  She is on trazodone 50mg  at night to decrease xanax use at night, she is on xanax as needed, the most is once a day.   She does workout. She denies chest pain, shortness of breath, dizziness.  She is not on cholesterol medication and denies myalgias. Her cholesterol is at goal. The cholesterol last visit was:   Lab Results  Component Value Date   CHOL 195 02/22/2018   HDL 64 02/22/2018   LDLCALC 109 (H)  02/22/2018   TRIG 108 02/22/2018   CHOLHDL 3.0 02/22/2018   Last A1C in the office was:  Lab Results  Component Value Date   HGBA1C 5.1 02/22/2018   Patient is on Vitamin D supplement.   Lab Results  Component Value Date   VD25OH 57 02/22/2018     Eats 2 meals a day, she does bored eat/emotional eat BMI is Body mass index is 28.11 kg/m., she is working on diet and exercise. On phentermine from 10/23- to now, and from 09/2017-06/2017,  Wt Readings from Last 3 Encounters:  08/27/18 156 lb 3.2 oz (70.9 kg)  05/19/18 154 lb 6.4 oz (70 kg)  02/22/18 153 lb 3.2 oz (69.5 kg)    Current Medications:  Current Outpatient Medications on File Prior to Visit  Medication Sig Dispense Refill  . acyclovir (ZOVIRAX) 400 MG tablet Take 1 tablet 2 x/day for Fever Blisters 180 tablet 1  . allopurinol (ZYLOPRIM) 300 MG tablet Take 1 tablet (300 mg total) by mouth daily. 90 tablet 1  . ALPRAZolam (XANAX) 1 MG tablet Take 0.5 tablets (0.5 mg total) by mouth daily as needed for anxiety. 45 tablet 0  . Ascorbic Acid (VITAMIN C PO) Take by mouth daily.    Marland Kitchen atenolol (TENORMIN) 100 MG tablet Take 1 tablet (100 mg total) by mouth daily. 90 tablet 1  . cholecalciferol (VITAMIN D) 1000 units tablet Take 1,000 Units by mouth daily.    . furosemide (LASIX) 40 MG tablet TAKE 1  TABLET BY MOUTH  DAILY 90 tablet 1  . MAGNESIUM PO Take 400 mg by mouth daily.     . milk thistle 175 MG tablet Take 175 mg by mouth daily.    . montelukast (SINGULAIR) 10 MG tablet TAKE 1 TABLET BY MOUTH AT  BEDTIME 90 tablet 3  . Omega-3 Fatty Acids (FISH OIL) 1000 MG CAPS Take 1,000 mg by mouth daily.    Marland Kitchen omeprazole (PRILOSEC) 20 MG capsule Take 20 mg by mouth daily.     . phentermine (ADIPEX-P) 37.5 MG tablet Take 1 tablet (37.5 mg total) by mouth daily before breakfast. 30 tablet 2  . PORTIA-28 0.15-30 MG-MCG tablet TAKE 1 TABLET BY MOUTH  DAILY 84 tablet 3  . telmisartan (MICARDIS) 80 MG tablet TAKE 1 TABLET BY MOUTH  DAILY 90  tablet 1  . traZODone (DESYREL) 50 MG tablet TAKE 1/2-1 TABLET FOR SLEEP 90 tablet 1   No current facility-administered medications on file prior to visit.    Medical History:  Past Medical History:  Diagnosis Date  . Allergy   . Anemia   . Anxiety   . Asthma   . Hypertension    Allergies:  Allergies  Allergen Reactions  . Ciprofloxacin Nausea Only    Headache  . Penicillins     Allergy has been more than 88yrs but unsure of what reaction was    Review of Systems:  Review of Systems  Constitutional: Negative.   HENT: Negative.   Eyes: Negative.   Respiratory: Negative.   Cardiovascular: Negative.   Gastrointestinal: Negative.   Genitourinary: Negative.   Musculoskeletal: Positive for back pain. Negative for falls, joint pain, myalgias and neck pain.  Skin: Negative.   Neurological: Negative.   Endo/Heme/Allergies: Negative.   Psychiatric/Behavioral: Negative for depression, hallucinations, memory loss, substance abuse and suicidal ideas. The patient is nervous/anxious. The patient does not have insomnia.     Family history- Review and unchanged Social history- Review and unchanged Physical Exam: BP 124/88   Pulse 87   Temp (!) 97.3 F (36.3 C)   Ht 5' 2.5" (1.588 m)   Wt 156 lb 3.2 oz (70.9 kg)   LMP 08/14/2018   SpO2 96%   BMI 28.11 kg/m  Wt Readings from Last 3 Encounters:  08/27/18 156 lb 3.2 oz (70.9 kg)  05/19/18 154 lb 6.4 oz (70 kg)  02/22/18 153 lb 3.2 oz (69.5 kg)   General Appearance: Well nourished, in no apparent distress. Eyes: PERRLA, EOMs, conjunctiva no swelling or erythema Sinuses: No Frontal/maxillary tenderness ENT/Mouth: Ext aud canals clear, TMs without erythema, bulging. No erythema, swelling, or exudate on post pharynx.  Tonsils not swollen or erythematous. Hearing normal.  Neck: Supple, thyroid normal.  Respiratory: Respiratory effort normal, BS equal bilaterally without rales, rhonchi, wheezing or stridor.  Cardio: RRR with no  MRGs. Brisk peripheral pulses without edema.  Abdomen: Soft, + BS,  Non tender, no guarding, rebound, hernias, masses. Lymphatics: Non tender without lymphadenopathy.  Musculoskeletal: Full ROM, 5/5 strength, Normal gait Skin: Warm, dry without rashes, lesions, ecchymosis.  Neuro: Cranial nerves intact. Normal muscle tone, no cerebellar symptoms. Psych: Awake and oriented X 3, normal affect, Insight and Judgment appropriate.    Vicie Mutters, PA-C 8:38 AM [facility]

## 2018-08-27 ENCOUNTER — Ambulatory Visit (INDEPENDENT_AMBULATORY_CARE_PROVIDER_SITE_OTHER): Payer: 59 | Admitting: Physician Assistant

## 2018-08-27 ENCOUNTER — Encounter: Payer: Self-pay | Admitting: Physician Assistant

## 2018-08-27 ENCOUNTER — Other Ambulatory Visit: Payer: Self-pay | Admitting: Physician Assistant

## 2018-08-27 VITALS — BP 124/88 | HR 87 | Temp 97.3°F | Ht 62.5 in | Wt 156.2 lb

## 2018-08-27 DIAGNOSIS — Z79899 Other long term (current) drug therapy: Secondary | ICD-10-CM

## 2018-08-27 DIAGNOSIS — I10 Essential (primary) hypertension: Secondary | ICD-10-CM

## 2018-08-27 DIAGNOSIS — E785 Hyperlipidemia, unspecified: Secondary | ICD-10-CM

## 2018-08-27 DIAGNOSIS — F341 Dysthymic disorder: Secondary | ICD-10-CM | POA: Diagnosis not present

## 2018-08-27 MED ORDER — ALPRAZOLAM 0.5 MG PO TABS: 0.5000 mg | ORAL_TABLET | Freq: Every day | ORAL | 0 refills | Status: DC | PRN

## 2018-08-27 MED ORDER — VENLAFAXINE HCL ER 37.5 MG PO CP24: 37.5000 mg | ORAL_CAPSULE | Freq: Every day | ORAL | 2 refills | Status: DC

## 2018-08-31 MED ORDER — ALLOPURINOL 300 MG PO TABS: 300.0000 mg | ORAL_TABLET | Freq: Every day | ORAL | 1 refills | Status: DC

## 2018-09-19 ENCOUNTER — Other Ambulatory Visit: Payer: Self-pay | Admitting: Physician Assistant

## 2018-09-19 DIAGNOSIS — F341 Dysthymic disorder: Secondary | ICD-10-CM

## 2018-09-23 ENCOUNTER — Other Ambulatory Visit: Payer: Self-pay | Admitting: Physician Assistant

## 2019-01-12 ENCOUNTER — Other Ambulatory Visit: Payer: Self-pay | Admitting: Physician Assistant

## 2019-01-31 ENCOUNTER — Other Ambulatory Visit: Payer: Self-pay | Admitting: Physician Assistant

## 2019-02-25 ENCOUNTER — Encounter: Payer: Self-pay | Admitting: Physician Assistant

## 2019-03-10 ENCOUNTER — Other Ambulatory Visit: Payer: Self-pay | Admitting: Physician Assistant

## 2019-03-27 ENCOUNTER — Other Ambulatory Visit: Payer: Self-pay | Admitting: Internal Medicine

## 2019-04-18 ENCOUNTER — Other Ambulatory Visit: Payer: Self-pay | Admitting: Internal Medicine

## 2019-04-23 ENCOUNTER — Other Ambulatory Visit: Payer: Self-pay | Admitting: Internal Medicine

## 2019-04-23 DIAGNOSIS — F341 Dysthymic disorder: Secondary | ICD-10-CM

## 2019-05-08 ENCOUNTER — Other Ambulatory Visit: Payer: Self-pay | Admitting: Physician Assistant

## 2019-05-12 ENCOUNTER — Other Ambulatory Visit: Payer: Self-pay

## 2019-05-12 MED ORDER — ATENOLOL 100 MG PO TABS: ORAL_TABLET | ORAL | 0 refills | Status: DC

## 2019-05-18 ENCOUNTER — Other Ambulatory Visit: Payer: Self-pay | Admitting: Internal Medicine

## 2019-05-18 DIAGNOSIS — F341 Dysthymic disorder: Secondary | ICD-10-CM

## 2019-05-27 ENCOUNTER — Other Ambulatory Visit: Payer: Self-pay | Admitting: Internal Medicine

## 2019-06-02 NOTE — Progress Notes (Signed)
Complete Physical  Assessment and Plan: CPE Get PAP next year, given number to set up [facility]  Essential hypertension continue medications, DASH diet, exercise and monitor at home. Call if greater than 130/80. - CBC with Differential/Platelet - BASIC METABOLIC PANEL WITH GFR - Hepatic function panel  - TSH - Urinalysis, Routine w reflex microscopic (not at [facility]) - Microalbumin / creatinine urine ratio - EKG 12-Lead   Anemia, unspecified anemia type - , continue iron supp with Vitamin C and increase green leafy veggies   Asthma, unspecified asthma severity, uncomplicated Continue singulair, albuterol PRN, avoid triggers.   Allergy, subsequent encounter Continue OTC allergy pills  Hypokalemia - Magnesium   Hyperlipidemia - Lipid panel  Vitamin D deficiency - Vit D  25 hydroxy (rtn osteoporosis monitoring)  Anxiety -     Discontinue: buPROPion (WELLBUTRIN XL) 150 MG 24 hr tablet; Take 2 tablets (300 mg total) by mouth every morning.  Chronic gout without tophus, unspecified cause, unspecified site -     Uric acid  Obesity BMI 30 with HTN gout, chol Saxenda, has failed topamax, phentermine The requested drug is being used as an adjunct to lifestyle modification (e.g., dietary or caloric restriction, exercise, behavioral support, community based program). The patient has engaged in > 59months of lifestyle modification and failed to achieve desired weight loss. The patient has BMI greater than or equal to 30 kilograms / square meter with additional disorder related to weight (high cholesterol, hypertension, gout).   Discussed med's effects and SE's. Screening labs and tests as requested with regular follow-up as recommended. Future Appointments  Date Time Provider [place]  06/05/2020  9:00 AM Vicie Mutters, PA-C [facility] None    HPI 58 y.o. female  presents for a complete physical. Her blood pressure has been controlled at home, she is on lasix 20, atenolol 100mg   and micardis 80mg  today their BP is BP: 120/74 She does not workout, new building at work and has not been working out. She denies chest pain, shortness of breath, dizziness.   BMI is Body mass index is 30.24 kg/m., she is not as active, she admits to emotional eating and ate more while working from home.  She has tried topamax, phentermine, and would like to try saxenda.  Wt Readings from Last 3 Encounters:  06/03/19 168 lb (76.2 kg)  08/27/18 156 lb 3.2 oz (70.9 kg)  05/19/18 154 lb 6.4 oz (70 kg)   She is not on cholesterol medication, she is on benefiber and denies myalgias. Her cholesterol is not at goal. The cholesterol last visit was:   Lab Results  Component Value Date   CHOL 195 02/22/2018   HDL 64 02/22/2018   LDLCALC 109 (H) 02/22/2018   TRIG 108 02/22/2018   CHOLHDL 3.0 02/22/2018   Last A1C in the office was:  Lab Results  Component Value Date   HGBA1C 5.1 02/22/2018   Patient is on Vitamin D supplement, dose not changed.   Lab Results  Component Value Date   VD25OH 58 02/22/2018   Patient is on allopurinol for gout and does not report a recent flare, she is on 1/2 of the allopurinol.   Lab Results  Component Value Date   LABURIC 3.7 02/22/2018   She is on a B complex Lab Results  Component Value Date   VITAMINB12 224 11/29/2013    Current Medications:  Current Outpatient Medications on File Prior to Visit  Medication Sig  . acyclovir (ZOVIRAX) 400 MG tablet Take 1  tablet 2 x  /day for Fever Blisters  . allopurinol (ZYLOPRIM) 300 MG tablet Take 1/2 to 1 tablet Daily to Prevent Gout  . ALPRAZolam (XANAX) 0.5 MG tablet Take 1 tablet (0.5 mg total) by mouth daily as needed for anxiety.  . Ascorbic Acid (VITAMIN C PO) Take by mouth daily.  . cholecalciferol (VITAMIN D) 1000 units tablet Take 1,000 Units by mouth daily.  . furosemide (LASIX) 40 MG tablet Take 1 tablet Daily for BP & Fluid Retention / Swelling  . MAGNESIUM PO Take 400 mg by mouth daily.   .  milk thistle 175 MG tablet Take 175 mg by mouth daily.  . montelukast (SINGULAIR) 10 MG tablet TAKE 1 TABLET BY MOUTH AT  BEDTIME  . Omega-3 Fatty Acids (FISH OIL) 1000 MG CAPS Take 1,000 mg by mouth daily.  Marland Kitchen omeprazole (PRILOSEC) 20 MG capsule Take 20 mg by mouth daily.   Marland Kitchen PORTIA-28 0.15-30 MG-MCG tablet TAKE 1 TABLET BY MOUTH  DAILY  . telmisartan (MICARDIS) 80 MG tablet Take 1 tablet Daily for BP - Need Office Visit before further refills  . traZODone (DESYREL) 100 MG tablet Take 1/2 to 1 tablet 1 hour before  Bedtime for Sleep   No current facility-administered medications on file prior to visit.     Patient Active Problem List   Diagnosis Date Noted  . Gout 12/12/2015  . Hyperlipidemia 12/12/2015  . Vitamin D deficiency 12/12/2015  . Hypokalemia 07/15/2013  . Hypertension   . Allergy   . Anemia   . Anxiety   . Asthma     Health Maintenance:   Immunization History  Administered Date(s) Administered  . Influenza-Unspecified 04/09/2016, 04/18/2017  . Pneumococcal Polysaccharide-23 06/30/1996  . Tdap 11/16/2012   Tetanus: 2014 Pneumovax: 1998 Prevnar 13: declines Flu vaccine: 2020 at office Zostavax: N/A  Pap: 2015 Negative due 5 years, never abnormal test- declines this visit MGM: 04/2017 OVER DUE given number DEXA: N/A Colonoscopy: 2011 due 2021 EGD: N/A  Allergies Allergies  Allergen Reactions  . Ciprofloxacin Nausea Only    Headache  . Penicillins     Allergy has been more than 22yrs but unsure of what reaction was    SURGICAL HISTORY She  has no past surgical history on file. FAMILY HISTORY Her family history includes Cataracts in her mother; Cirrhosis in her mother; Dementia in her father; Diabetes in her father and mother; Heart disease in her father; Hypertension in her father and mother; Thyroid disease in her mother. SOCIAL HISTORY She  reports that she has never smoked. She has never used smokeless tobacco. She reports current alcohol use of  about 14.0 standard drinks of alcohol per week. She reports that she does not use drugs. Will drink 2 glasses of wine a day.   Review of Systems  Constitutional: Negative.   HENT: Negative.   Eyes: Negative.   Respiratory: Negative.   Cardiovascular: Negative.   Gastrointestinal: Negative.   Genitourinary: Negative.   Musculoskeletal: Negative for back pain, falls, joint pain, myalgias and neck pain.  Skin: Negative.   Neurological: Negative.   Endo/Heme/Allergies: Negative.   Psychiatric/Behavioral: Negative for depression, hallucinations, memory loss, substance abuse and suicidal ideas. The patient is not nervous/anxious and does not have insomnia.      Physical Exam: Estimated body mass index is 30.24 kg/m as calculated from the following:   Height as of this encounter: 5' 2.5" (1.588 m).   Weight as of this encounter: 168 lb (76.2 kg). BP 120/74  Pulse 91   Temp 98.4 F (36.9 C)   Ht 5' 2.5" (1.588 m)   Wt 168 lb (76.2 kg)   SpO2 97%   BMI 30.24 kg/m  General Appearance: Well nourished, in no apparent distress. Eyes: PERRLA, EOMs, conjunctiva no swelling or erythema, normal fundi and vessels. Sinuses: No Frontal/maxillary tenderness ENT/Mouth: Ext aud canals clear, normal light reflex with TMs without erythema, bulging.  Good dentition. No erythema, swelling, or exudate on post pharynx. Tonsils not swollen or erythematous. Hearing normal.  Neck: Supple, thyroid normal. No bruits Respiratory: Respiratory effort normal, BS equal bilaterally without rales, rhonchi, wheezing or stridor. Cardio: RRR without murmurs, rubs or gallops. Brisk peripheral pulses without edema.  Chest: symmetric, with normal excursions and percussion. Breasts: Symmetric, without lumps, nipple discharge, retractions. Abdomen: Soft, +BS. Non tender, no guarding, rebound, hernias, masses, or organomegaly. .  Lymphatics: Non tender without lymphadenopathy.  Genitourinary: defer Musculoskeletal: Full  ROM all peripheral extremities,5/5 strength, and normal gait. Skin: Warm, dry without rashes, lesions, ecchymosis.  Neuro: Cranial nerves intact, reflexes equal bilaterally. Normal muscle tone, no cerebellar symptoms. Sensation intact.  Psych: Awake and oriented X 3, normal affect, Insight and Judgment appropriate.   EKG: WNL no changes. AORTA SCAN: N/A   Vicie Mutters 10:37 AM

## 2019-06-03 ENCOUNTER — Ambulatory Visit (INDEPENDENT_AMBULATORY_CARE_PROVIDER_SITE_OTHER): Payer: 59 | Admitting: Physician Assistant

## 2019-06-03 ENCOUNTER — Encounter: Payer: Self-pay | Admitting: Physician Assistant

## 2019-06-03 ENCOUNTER — Other Ambulatory Visit: Payer: Self-pay

## 2019-06-03 VITALS — BP 120/74 | HR 91 | Temp 98.4°F | Ht 62.5 in | Wt 168.0 lb

## 2019-06-03 DIAGNOSIS — Z0000 Encounter for general adult medical examination without abnormal findings: Secondary | ICD-10-CM

## 2019-06-03 DIAGNOSIS — Z131 Encounter for screening for diabetes mellitus: Secondary | ICD-10-CM

## 2019-06-03 DIAGNOSIS — F341 Dysthymic disorder: Secondary | ICD-10-CM

## 2019-06-03 DIAGNOSIS — D649 Anemia, unspecified: Secondary | ICD-10-CM

## 2019-06-03 DIAGNOSIS — Z0001 Encounter for general adult medical examination with abnormal findings: Secondary | ICD-10-CM

## 2019-06-03 DIAGNOSIS — E785 Hyperlipidemia, unspecified: Secondary | ICD-10-CM

## 2019-06-03 DIAGNOSIS — E669 Obesity, unspecified: Secondary | ICD-10-CM

## 2019-06-03 DIAGNOSIS — E559 Vitamin D deficiency, unspecified: Secondary | ICD-10-CM

## 2019-06-03 DIAGNOSIS — E876 Hypokalemia: Secondary | ICD-10-CM

## 2019-06-03 DIAGNOSIS — T7840XD Allergy, unspecified, subsequent encounter: Secondary | ICD-10-CM

## 2019-06-03 DIAGNOSIS — F419 Anxiety disorder, unspecified: Secondary | ICD-10-CM

## 2019-06-03 DIAGNOSIS — Z136 Encounter for screening for cardiovascular disorders: Secondary | ICD-10-CM | POA: Diagnosis not present

## 2019-06-03 DIAGNOSIS — J45909 Unspecified asthma, uncomplicated: Secondary | ICD-10-CM

## 2019-06-03 DIAGNOSIS — I10 Essential (primary) hypertension: Secondary | ICD-10-CM

## 2019-06-03 DIAGNOSIS — Z79899 Other long term (current) drug therapy: Secondary | ICD-10-CM

## 2019-06-03 DIAGNOSIS — M1A9XX0 Chronic gout, unspecified, without tophus (tophi): Secondary | ICD-10-CM

## 2019-06-03 MED ORDER — ATENOLOL 100 MG PO TABS: ORAL_TABLET | ORAL | 1 refills | Status: DC

## 2019-06-03 MED ORDER — VENLAFAXINE HCL ER 37.5 MG PO CP24: ORAL_CAPSULE | ORAL | 2 refills | Status: DC

## 2019-06-03 MED ORDER — SAXENDA 18 MG/3ML ~~LOC~~ SOPN: 3.0000 mg | PEN_INJECTOR | Freq: Every day | SUBCUTANEOUS | 2 refills | Status: DC

## 2019-06-03 NOTE — Patient Instructions (Addendum)
Try the saxenda  HOW TO SCHEDULE A MAMMOGRAM  [facility]  7 a.m.-6:30 p.m., Monday 7 a.m.-5 p.m., Tuesday-Friday Schedule an appointment by calling [phone].  Try not to have alcohol daily, can do spritzers, sparkling water, hot tea, will help with weight loss  Your ears and sinuses are connected by the eustachian tube. When your sinuses are inflamed, this can close off the tube and cause fluid to collect in your middle ear. This can then cause dizziness, popping, clicking, ringing, and echoing in your ears. This is often NOT an infection and does NOT require antibiotics, it is caused by inflammation so the treatments help the inflammation. This can take a long time to get better so please be patient.  Here are things you can do to help with this: - Try the Flonase or Nasonex. Remember to spray each nostril twice towards the outer part of your eye.  Do not sniff but instead pinch your nose and tilt your head back to help the medicine get into your sinuses.  The best time to do this is at bedtime.Stop if you get blurred vision or nose bleeds.  -While drinking fluids, pinch and hold nose close and swallow, to help open eustachian tubes to drain fluid behind ear drums. -Please pick one of the over the counter allergy medications below and take it once daily for allergies.  It will also help with fluid behind ear drums. Claritin or loratadine cheapest but likely the weakest  Zyrtec or certizine at night because it can make you sleepy The strongest is allegra or fexafinadine  Cheapest at [employer], [employer], [employer] -can use decongestant over the counter, please do not use if you have high blood pressure or certain heart conditions.   if worsening HA, changes vision/speech, imbalance, weakness go to the ER   Fatty liver or Nonalcoholic fatty liver disease (NASH)  Now the leading cause of liver failure in the [place].  It is normally from such risk factors as  obesity, diabetes, insulin resistance, high cholesterol, or metabolic syndrome.  The only definitive therapy is weight loss and exercise.    Suggest walking 20-30 mins daily.  Decreasing carbohydrates, increasing veggies.  Vitamin E 800 IU a day may be beneficial. Liver cancer has been noted in patient with fatty liver without cirrhosis.  Will monitor closely   Fatty Liver Fatty liver is the accumulation of fat in liver cells. It is also called hepatosteatosis or steatohepatitis. It is normal for your liver to contain some fat. If fat is more than 5 to 10% of your liver's weight, you have fatty liver.  There are often no symptoms (problems) for years while damage is still occurring. People often learn about their fatty liver when they have medical tests for other reasons. Fat can damage your liver for years or even decades without causing problems. When it becomes severe, it can cause fatigue, weight loss, weakness, and confusion. This makes you more likely to develop more serious liver problems. The liver is the largest organ in the body. It does a lot of work and often gives no warning signs when it is sick until late in a disease. The liver has many important jobs including:  Breaking down foods.  Storing vitamins, iron, and other minerals.  Making proteins.  Making bile for food digestion.  Breaking down many products including medications, alcohol and some poisons.  PROGNOSIS  Fatty liver may cause no damage or it can lead to  an inflammation of the liver. This is, called steatohepatitis.  Over time the liver may become scarred and hardened. This condition is called cirrhosis. Cirrhosis is serious and may lead to liver failure or cancer. NASH is one of the leading causes of cirrhosis. About 10-20% of Americans have fatty liver and a smaller 2-5% has NASH.  TREATMENT   Weight loss, fat restriction, and exercise in overweight patients produces inconsistent results but is worth  trying.  Good control of diabetes may reduce fatty liver.  Eat a balanced, healthy diet.  Increase your physical activity.  There are no medical or surgical treatments for a fatty liver or NASH, but improving your diet and increasing your exercise may help prevent or reverse some of the damage.   General eating tips  What to Avoid . Avoid added sugars o Often added sugar can be found in processed foods such as many condiments, dry cereals, cakes, cookies, chips, crisps, crackers, candies, sweetened drinks, etc.  o Read labels and AVOID/DECREASE use of foods with the following in their ingredient list: Sugar, fructose, high fructose corn syrup, sucrose, glucose, maltose, dextrose, molasses, cane sugar, brown sugar, any type of syrup, agave nectar, etc.   . Avoid snacking in between meals- drink water or if you feel you need a snack, pick a high water content snack such as cucumbers, watermelon, or any veggie.  Marland Kitchen Avoid foods made with flour o If you are going to eat food made with flour, choose those made with whole-grains; and, minimize your consumption as much as is tolerable . Avoid processed foods o These foods are generally stocked in the middle of the grocery store.  o Focus on shopping on the perimeter of the grocery.  What to Include . Vegetables o GREEN LEAFY VEGETABLES: Kale, spinach, mustard greens, collard greens, cabbage, broccoli, etc. o OTHER: Asparagus, cauliflower, eggplant, carrots, peas, Brussel sprouts, tomatoes, bell peppers, zucchini, beets, cucumbers, etc. . Grains, seeds, and legumes o Beans: kidney beans, black eyed peas, garbanzo beans, black beans, pinto beans, etc. o Whole, unrefined grains: brown rice, barley, bulgur, oatmeal, etc. . Healthy fats  o Avoid highly processed fats such as vegetable oil o Examples of healthy fats: avocado, olives, virgin olive oil, dark chocolate (?72% Cocoa), nuts (peanuts, almonds, walnuts, cashews, pecans, etc.) o Please  still do small amount of these healthy fats, they are dense in calories.  . Low - Moderate Intake of Animal Sources of Protein o Meat sources: chicken, [place], salmon, tuna. Limit to 4 ounces of meat at one time or the size of your palm. o Consider limiting dairy sources, but when choosing dairy focus on: PLAIN [place] yogurt, cottage cheese, high-protein milk . Fruit o Choose berries

## 2019-06-04 LAB — COMPLETE METABOLIC PANEL WITH GFR
AG Ratio: 1.7 (calc) (ref 1.0–2.5)
ALT: 29 U/L (ref 6–29)
AST: 41 U/L — ABNORMAL HIGH (ref 10–35)
Albumin: 4.4 g/dL (ref 3.6–5.1)
Alkaline phosphatase (APISO): 61 U/L (ref 37–153)
BUN: 12 mg/dL (ref 7–25)
CO2: 28 mmol/L (ref 20–32)
Calcium: 9.7 mg/dL (ref 8.6–10.4)
Chloride: 99 mmol/L (ref 98–110)
Creat: 0.74 mg/dL (ref 0.50–1.05)
GFR, Est African American: 103 mL/min/{1.73_m2} (ref >=60)
GFR, Est Non African American: 89 mL/min/{1.73_m2} (ref >=60)
Globulin: 2.6 g/dL (calc) (ref 1.9–3.7)
Glucose, Bld: 98 mg/dL (ref 65–99)
Potassium: 3.3 mmol/L — ABNORMAL LOW (ref 3.5–5.3)
Sodium: 139 mmol/L (ref 135–146)
Total Bilirubin: 0.3 mg/dL (ref 0.2–1.2)
Total Protein: 7 g/dL (ref 6.1–8.1)

## 2019-06-04 LAB — HEMOGLOBIN A1C
Hgb A1c MFr Bld: 5.1 % of total Hgb (ref <5.7)
Mean Plasma Glucose: 100 (calc)
eAG (mmol/L): 5.5 (calc)

## 2019-06-04 LAB — CBC WITH DIFFERENTIAL/PLATELET
Absolute Monocytes: 708 cells/uL (ref 200–950)
Basophils Absolute: 92 cells/uL (ref 0–200)
Basophils Relative: 1 %
Eosinophils Absolute: 248 cells/uL (ref 15–500)
Eosinophils Relative: 2.7 %
HCT: 41.4 % (ref 35.0–45.0)
Hemoglobin: 14 g/dL (ref 11.7–15.5)
Lymphs Abs: 2171 cells/uL (ref 850–3900)
MCH: 34.4 pg — ABNORMAL HIGH (ref 27.0–33.0)
MCHC: 33.8 g/dL (ref 32.0–36.0)
MCV: 101.7 fL — ABNORMAL HIGH (ref 80.0–100.0)
MPV: 10.9 fL (ref 7.5–12.5)
Monocytes Relative: 7.7 %
Neutro Abs: 5980 cells/uL (ref 1500–7800)
Neutrophils Relative %: 65 %
Platelets: 300 10*3/uL (ref 140–400)
RBC: 4.07 10*6/uL (ref 3.80–5.10)
RDW: 12.7 % (ref 11.0–15.0)
Total Lymphocyte: 23.6 %
WBC: 9.2 10*3/uL (ref 3.8–10.8)

## 2019-06-04 LAB — LIPID PANEL
Cholesterol: 195 mg/dL (ref <200)
HDL: 66 mg/dL (ref >=50)
LDL Cholesterol (Calc): 104 mg/dL (calc) — ABNORMAL HIGH
Non-HDL Cholesterol (Calc): 129 mg/dL (calc) (ref <130)
Total CHOL/HDL Ratio: 3 (calc) (ref <5.0)
Triglycerides: 153 mg/dL — ABNORMAL HIGH (ref <150)

## 2019-06-04 LAB — URINALYSIS, ROUTINE W REFLEX MICROSCOPIC
Bilirubin Urine: NEGATIVE
Glucose, UA: NEGATIVE
Hgb urine dipstick: NEGATIVE
Ketones, ur: NEGATIVE
Leukocytes,Ua: NEGATIVE
Nitrite: NEGATIVE
Protein, ur: NEGATIVE
Specific Gravity, Urine: 1.011 (ref 1.001–1.03)
pH: 7 (ref 5.0–8.0)

## 2019-06-04 LAB — MICROALBUMIN / CREATININE URINE RATIO
Creatinine, Urine: 50 mg/dL (ref 20–275)
Microalb Creat Ratio: 14 mcg/mg creat (ref <30)
Microalb, Ur: 0.7 mg/dL

## 2019-06-04 LAB — VITAMIN B12: Vitamin B-12: 394 pg/mL (ref 200–1100)

## 2019-06-04 LAB — TSH: TSH: 0.99 mIU/L (ref 0.40–4.50)

## 2019-06-04 LAB — MAGNESIUM: Magnesium: 1.8 mg/dL (ref 1.5–2.5)

## 2019-06-04 LAB — VITAMIN D 25 HYDROXY (VIT D DEFICIENCY, FRACTURES): Vit D, 25-Hydroxy: 62 ng/mL (ref 30–100)

## 2019-06-06 MED ORDER — NOVOFINE 32G X 6 MM MISC: 2 refills | Status: DC

## 2019-06-13 NOTE — Progress Notes (Signed)
Assessment and Plan:  Valerie Hobbs was seen today for acute visit and sciatica.  Diagnoses and all orders for this visit:  Sciatic leg pain      -  dexamethasone (DECADRON) injection 10 mg, trigger point injection Ice area for 52min a time over next 24hours. -     predniSONE (STERAPRED UNI-PAK 21 TAB) 10 MG (21) TBPK tablet; Take by mouth daily. Follow directions on Taper pack Take with food to avoid stomach upset.  Start tomorrow.  Paresthesia Discussed likely from inflammation discussed multiple etiologies, arthritic changes, bone spur, spinal stenosis, etc. Consider imaging.orth referral if no improvement   Contact office with any new or worsening symptoms   Further disposition pending results of labs. Discussed med's effects and SE's.   Over 30 minutes of interview, exam, counseling, chart review, and critical decision making was performed.   Future Appointments  Date Time Provider [place]  10/03/2019  8:45 AM Vicie Mutters, PA-C [facility] None  06/05/2020  9:00 AM Vicie Mutters, PA-C [facility] None    ------------------------------------------------------------------------------------------------------------------   HPI 58 y.o.female presents for low back pain that is radiating to her right leg.  She first noticed on Wednesday.  She has tried getting in warm bath with jets.  It starts at base of her spine and shoots down her leg all the way to her foot.  Reports tingling in her right foot.   The tingling comes and goes.  She is also having pain with initiating walking from sitting to standing it feels like her right hip gets stuck.  She has also been stretching ham strings and calves.  She has ibuprofen 800mg  twice a day and this would help some but the pain has been increasing.  Today she reports a 9/10 pain.  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  It is extremely difficult to sit all day with the pain nagging.  Adjusting in her chair does not help. She  tried using a standing desk and it is difficult to use but has helped some.  She reports that when she lays down there is no pain that wakes her during the night. She denies any trauma, falls, new activities or exercises.  Unable to recall anything triggering this pain.  Reports she has had a similar pain years ago on the left but it did not last this long or was it this intense.   Past Medical History:  Diagnosis Date  . Allergy   . Anemia   . Anxiety   . Asthma   . Hypertension      Allergies  Allergen Reactions  . Ciprofloxacin Nausea Only    Headache  . Penicillins     Allergy has been more than 38yrs but unsure of what reaction was    Current Outpatient Medications on File Prior to Visit  Medication Sig  . acyclovir (ZOVIRAX) 400 MG tablet Take 1 tablet 2 x  /day for Fever Blisters  . allopurinol (ZYLOPRIM) 300 MG tablet Take 1/2 to 1 tablet Daily to Prevent Gout  . ALPRAZolam (XANAX) 0.5 MG tablet Take 1 tablet (0.5 mg total) by mouth daily as needed for anxiety.  . Ascorbic Acid (VITAMIN C PO) Take by mouth daily.  Marland Kitchen atenolol (TENORMIN) 100 MG tablet Take 1 tablet Daily for BP - Absolutely NO MORE REFILLS until seen in Office  . cholecalciferol (VITAMIN D) 1000 units tablet Take 1,000 Units by mouth daily.  . furosemide (LASIX) 40 MG tablet Take 1  tablet Daily for BP & Fluid Retention / Swelling  . Insulin Pen Needle (NOVOFINE) 32G X 6 MM MISC Needs daily for saxenda  . Liraglutide -Weight Management (SAXENDA) 18 MG/3ML SOPN Inject 3 mg into the skin daily. Start 0.6mg  Lloyd daily x 1 week, then increase dose by 0.6mg  daily every week. Max dose is 3 mg a day.  Marland Kitchen MAGNESIUM PO Take 400 mg by mouth daily.   . milk thistle 175 MG tablet Take 175 mg by mouth daily.  . montelukast (SINGULAIR) 10 MG tablet TAKE 1 TABLET BY MOUTH AT  BEDTIME  . Omega-3 Fatty Acids (FISH OIL) 1000 MG CAPS Take 1,000 mg by mouth daily.  Marland Kitchen omeprazole (PRILOSEC) 20 MG capsule Take 20 mg by mouth daily.    Marland Kitchen PORTIA-28 0.15-30 MG-MCG tablet TAKE 1 TABLET BY MOUTH  DAILY  . telmisartan (MICARDIS) 80 MG tablet Take 1 tablet Daily for BP - Need Office Visit before further refills  . traZODone (DESYREL) 100 MG tablet Take 1/2 to 1 tablet 1 hour before  Bedtime for Sleep  . venlafaxine XR (EFFEXOR-XR) 37.5 MG 24 hr capsule TAKE 1 CAPSULE DAILY FOR MOOD   No current facility-administered medications on file prior to visit.    ROS: all negative except above.   Physical Exam:  BP 128/64   Pulse 74   Temp 98.1 F (36.7 C)   Wt 170 lb 3.2 oz (77.2 kg)   SpO2 95%   BMI 30.63 kg/m   General Appearance: Well nourished, in no apparent distress. Eyes: PERRLA, EOMs, conjunctiva no swelling or erythema ENT/Wearing mask. Hearing normal.  Neck: Supple, thyroid normal.  Respiratory: Respiratory effort normal, BS equal bilaterally without rales, rhonchi, wheezing or stridor.  Cardio: RRR with no MRGs. Brisk peripheral pulses without edema.  Abdomen: Soft, + BS.  Non tender, no guarding, rebound, hernias, masses. Lymphatics: Non tender without lymphadenopathy.  Musculoskeletal: Full ROM, 5/5 strength, normal gait. Positive straight leg on right.  Pain with active and passive abduction of right hip. Skin: Warm, dry without rashes, lesions, ecchymosis.  Neuro: Cranial nerves intact. Normal muscle tone, no cerebellar symptoms. Sensation intact.  Psych: Awake and oriented X 3, normal affect, Insight and Judgment appropriate.    Trigger point injection: Right Verbal consent obtained, risk and benefits were addressed with patient. A trigger point injection was performed at the site of maximal tenderness using 1% plain Lidocaine and Dexamethasone. This was well tolerated, and followed by immediate relief of pain. Return precautions discussed with the patient.    Garnet Sierras, NP 9:20 AM [facility]

## 2019-06-14 ENCOUNTER — Encounter: Payer: Self-pay | Admitting: Adult Health Nurse Practitioner

## 2019-06-14 ENCOUNTER — Ambulatory Visit (INDEPENDENT_AMBULATORY_CARE_PROVIDER_SITE_OTHER): Payer: 59 | Admitting: Adult Health Nurse Practitioner

## 2019-06-14 ENCOUNTER — Other Ambulatory Visit: Payer: Self-pay

## 2019-06-14 VITALS — BP 128/64 | HR 74 | Temp 98.1°F | Wt 170.2 lb

## 2019-06-14 DIAGNOSIS — M5430 Sciatica, unspecified side: Secondary | ICD-10-CM

## 2019-06-14 DIAGNOSIS — R202 Paresthesia of skin: Secondary | ICD-10-CM | POA: Diagnosis not present

## 2019-06-14 MED ORDER — PREDNISONE 10 MG (21) PO TBPK: ORAL_TABLET | Freq: Every day | ORAL | 0 refills | Status: DC

## 2019-06-14 MED ORDER — DEXAMETHASONE SODIUM PHOSPHATE 10 MG/ML IJ SOLN: 10.0000 mg | Freq: Once | INTRAMUSCULAR | Status: DC

## 2019-06-14 NOTE — Patient Instructions (Signed)
BACK PAIN  We will send in prednisone taper pack for you  Take this medication with food.  You received a trigger point injection in the office today of Dexamethasone.  Ice the area for 25min at a time for the next 24 hours.  If you do you get any relief with this let us know and we will do some imaging and orthopedics referral.   Go to the ER if you have any new weakness in your legs, have trouble controlling your urine or bowels, or have worsening pain.      Back pain Rehab Ask your health care provider which exercises are safe for you. Do exercises exactly as told by your health care provider and adjust them as directed. It is normal to feel mild stretching, pulling, tightness, or discomfort as you do these exercises, but you should stop right away if you feel sudden pain or your pain gets worse. Do not begin these exercises until told by your health care provider. Stretching and range of motion exercises These exercises warm up your muscles and joints and improve the movement and flexibility of your hips and your back. These exercises also help to relieve pain, numbness, and tingling. Exercise A: Sciatic nerve glide 1. Sit in a chair with your head facing down toward your chest. Place your hands behind your back. Let your shoulders slump forward. 2. Slowly straighten one of your knees while you tilt your head back as if you are looking toward the ceiling. Only straighten your leg as far as you can without making your symptoms worse. 3. Hold for __________ seconds. 4. Slowly return to the starting position. 5. Repeat with your other leg. Repeat __________ times. Complete this exercise __________ times a day. Exercise B: Knee to chest with hip adduction and internal rotation  1. Lie on your back on a firm surface with both legs straight. 2. Bend one of your knees and move it up toward your chest until you feel a gentle stretch in your lower back and buttock. Then, move your knee  toward the shoulder that is on the opposite side from your leg. ? Hold your leg in this position by holding onto the front of your knee. 3. Hold for __________ seconds. 4. Slowly return to the starting position. 5. Repeat with your other leg. Repeat __________ times. Complete this exercise __________ times a day. Exercise C: Prone extension on elbows  1. Lie on your abdomen on a firm surface. A bed may be too soft for this exercise. 2. Prop yourself up on your elbows. 3. Use your arms to help lift your chest up until you feel a gentle stretch in your abdomen and your lower back. ? This will place some of your body weight on your elbows. If this is uncomfortable, try stacking pillows under your chest. ? Your hips should stay down, against the surface that you are lying on. Keep your hip and back muscles relaxed. 4. Hold for __________ seconds. 5. Slowly relax your upper body and return to the starting position. Repeat __________ times. Complete this exercise __________ times a day. Strengthening exercises These exercises build strength and endurance in your back. Endurance is the ability to use your muscles for a long time, even after they get tired. Exercise D: Pelvic tilt 1. Lie on your back on a firm surface. Bend your knees and keep your feet flat. 2. Tense your abdominal muscles. Tip your pelvis up toward the ceiling and flatten your lower  back into the floor. ? To help with this exercise, you may place a small towel under your lower back and try to push your back into the towel. 3. Hold for __________ seconds. 4. Let your muscles relax completely before you repeat this exercise. Repeat __________ times. Complete this exercise __________ times a day. Exercise E: Alternating arm and leg raises  1. Get on your hands and knees on a firm surface. If you are on a hard floor, you may want to use padding to cushion your knees, such as an exercise mat. 2. Line up your arms and legs. Your  hands should be below your shoulders, and your knees should be below your hips. 3. Lift your left leg behind you. At the same time, raise your right arm and straighten it in front of you. ? Do not lift your leg higher than your hip. ? Do not lift your arm higher than your shoulder. ? Keep your abdominal and back muscles tight. ? Keep your hips facing the ground. ? Do not arch your back. ? Keep your balance carefully, and do not hold your breath. 4. Hold for __________ seconds. 5. Slowly return to the starting position and repeat with your right leg and your left arm. Repeat __________ times. Complete this exercise __________ times a day. Posture and body mechanics  Body mechanics refers to the movements and positions of your body while you do your daily activities. Posture is part of body mechanics. Good posture and healthy body mechanics can help to relieve stress in your body's tissues and joints. Good posture means that your spine is in its natural S-curve position (your spine is neutral), your shoulders are pulled back slightly, and your head is not tipped forward. The following are general guidelines for applying improved posture and body mechanics to your everyday activities. Standing   When standing, keep your spine neutral and your feet about hip-width apart. Keep a slight bend in your knees. Your ears, shoulders, and hips should line up.  When you do a task in which you stand in one place for a long time, place one foot up on a stable object that is 2-4 inches (5-10 cm) high, such as a footstool. This helps keep your spine neutral. Sitting   When sitting, keep your spine neutral and keep your feet flat on the floor. Use a footrest, if necessary, and keep your thighs parallel to the floor. Avoid rounding your shoulders, and avoid tilting your head forward.  When working at a desk or a computer, keep your desk at a height where your hands are slightly lower than your elbows. Slide  your chair under your desk so you are close enough to maintain good posture.  When working at a computer, place your monitor at a height where you are looking straight ahead and you do not have to tilt your head forward or downward to look at the screen. Resting   When lying down and resting, avoid positions that are most painful for you.  If you have pain with activities such as sitting, bending, stooping, or squatting (flexion-based activities), lie in a position in which your body does not bend very much. For example, avoid curling up on your side with your arms and knees near your chest (fetal position).  If you have pain with activities such as standing for a long time or reaching with your arms (extension-based activities), lie with your spine in a neutral position and bend your knees slightly. Try  the following positions: ? Lying on your side with a pillow between your knees. ? Lying on your back with a pillow under your knees. Lifting   When lifting objects, keep your feet at least shoulder-width apart and tighten your abdominal muscles.  Bend your knees and hips and keep your spine neutral. It is important to lift using the strength of your legs, not your back. Do not lock your knees straight out.  Always ask for help to lift heavy or awkward objects. This information is not intended to replace advice given to you by your health care provider. Make sure you discuss any questions you have with your health care provider. Document Released: 06/16/2005 Document Revised: 02/21/2016 Document Reviewed: 03/02/2015 Elsevier Interactive Patient Education  [employer].

## 2019-06-20 ENCOUNTER — Other Ambulatory Visit: Payer: Self-pay | Admitting: Adult Health Nurse Practitioner

## 2019-06-20 DIAGNOSIS — M5430 Sciatica, unspecified side: Secondary | ICD-10-CM

## 2019-06-20 MED ORDER — GABAPENTIN 100 MG PO CAPS: 100.0000 mg | ORAL_CAPSULE | Freq: Three times a day (TID) | ORAL | 2 refills | Status: DC

## 2019-06-28 ENCOUNTER — Other Ambulatory Visit: Payer: Self-pay | Admitting: Internal Medicine

## 2019-07-07 ENCOUNTER — Other Ambulatory Visit: Payer: Self-pay | Admitting: Physician Assistant

## 2019-07-22 ENCOUNTER — Other Ambulatory Visit: Payer: Self-pay | Admitting: Physician Assistant

## 2019-08-01 MED ORDER — ALLOPURINOL 300 MG PO TABS: ORAL_TABLET | ORAL | 0 refills | Status: DC

## 2019-09-14 ENCOUNTER — Other Ambulatory Visit: Payer: Self-pay

## 2019-09-14 MED ORDER — TRAZODONE HCL 100 MG PO TABS: ORAL_TABLET | ORAL | 0 refills | Status: DC

## 2019-09-28 NOTE — Progress Notes (Deleted)
Assessment and Plan:  Essential hypertension - continue medications, DASH diet, exercise and monitor at home. Call if greater than 130/80.   Morbid Obesity with co morbidities - follow up 6 months for progress monitoring - increase veggies, decrease carbs - long discussion about weight loss, diet, and exercise -     phentermine (ADIPEX-P) 37.5 MG tablet; Take 1 tablet (37.5 mg total) by mouth daily before breakfast. - follow up at CPE  Hyperlipidemia, unspecified hyperlipidemia type -continue medications, check lipids, decrease fatty foods, increase activity. '  Insomnia, unspecified type -     traZODone (DESYREL) 50 MG tablet; 1/2-1 tablet for sleep [facility] - continue xanax PRN - refilled  Anxiety Willing to try effexor, did not do well with celexa and wellbutrin Refill xanax during this time, contact in 3 months   Continue diet and meds as discussed. Further disposition pending results of labs. Over 30 minutes of exam, counseling, chart review, and critical decision making was performed Future Appointments  Date Time Provider [place]  10/03/2019  8:45 AM Vicie Mutters, PA-C [facility] None  06/05/2020  9:00 AM Vicie Mutters, PA-C [facility] None    HPI 59 y.o. female  presents for 3 month follow up on hypertension, cholesterol, prediabetes, and vitamin D deficiency.   Her blood pressure has been controlled at home, she is on atenolol at night 50mg , today their BP is     Mom passed 2017, dad passed Feb 11th, and she is still trying to find a job.  She is on trazodone 50mg  at night to decrease xanax use at night, she is on xanax as needed, the most is once a day.   She does workout. She denies chest pain, shortness of breath, dizziness.  She is not on cholesterol medication and denies myalgias. Her cholesterol is at goal. The cholesterol last visit was:   Lab Results  Component Value Date   CHOL 195 06/03/2019   HDL 66 06/03/2019   LDLCALC 104 (H)  06/03/2019   TRIG 153 (H) 06/03/2019   CHOLHDL 3.0 06/03/2019   Last A1C in the office was:  Lab Results  Component Value Date   HGBA1C 5.1 06/03/2019   Patient is on Vitamin D supplement.   Lab Results  Component Value Date   VD25OH 62 06/03/2019     Eats 2 meals a day, she does bored eat/emotional eat BMI is There is no height or weight on file to calculate BMI., she is working on diet and exercise. On phentermine from 10/23- to now, and from 09/2017-06/2017,  Wt Readings from Last 3 Encounters:  06/14/19 170 lb 3.2 oz (77.2 kg)  06/03/19 168 lb (76.2 kg)  08/27/18 156 lb 3.2 oz (70.9 kg)    Current Medications:  Current Outpatient Medications on File Prior to Visit  Medication Sig Dispense Refill  . acyclovir (ZOVIRAX) 400 MG tablet Take 1 tablet 2 x  /day for Fever Blisters 180 tablet 3  . allopurinol (ZYLOPRIM) 300 MG tablet Take 1 tablet Daily to Prevent Gout 14 tablet 0  . ALPRAZolam (XANAX) 0.5 MG tablet Take 1 tablet (0.5 mg total) by mouth daily as needed for anxiety. 35 tablet 0  . Ascorbic Acid (VITAMIN C PO) Take by mouth daily.    Marland Kitchen atenolol (TENORMIN) 100 MG tablet Take 1 tablet Daily for BP - Absolutely NO MORE REFILLS until seen in Office 90 tablet 1  . cholecalciferol (VITAMIN D) 1000 units tablet Take 1,000 Units by mouth daily.    Marland Kitchen  furosemide (LASIX) 40 MG tablet Take 1 tablet Daily for BP & Fluid Retention / Ankle Swelling 90 tablet 1  . gabapentin (NEURONTIN) 100 MG capsule Take 1 capsule (100 mg total) by mouth 3 (three) times daily. 90 capsule 2  . Insulin Pen Needle (NOVOFINE) 32G X 6 MM MISC Needs daily for saxenda 90 each 2  . Liraglutide -Weight Management (SAXENDA) 18 MG/3ML SOPN Inject 3 mg into the skin daily. Start 0.6mg  [place] daily x 1 week, then increase dose by 0.6mg  daily every week. Max dose is 3 mg a day. 30 pen 2  . MAGNESIUM PO Take 400 mg by mouth daily.     . milk thistle 175 MG tablet Take 175 mg by mouth daily.    . montelukast  (SINGULAIR) 10 MG tablet TAKE 1 TABLET BY MOUTH AT  BEDTIME 90 tablet 3  . Omega-3 Fatty Acids (FISH OIL) 1000 MG CAPS Take 1,000 mg by mouth daily.    Marland Kitchen omeprazole (PRILOSEC) 20 MG capsule Take 20 mg by mouth daily.     Marland Kitchen PORTIA-28 0.15-30 MG-MCG tablet TAKE 1 TABLET BY MOUTH  DAILY 84 tablet 3  . predniSONE (STERAPRED UNI-PAK 21 TAB) 10 MG (21) TBPK tablet Take by mouth daily. Follow directions on Taper pack 21 tablet 0  . telmisartan (MICARDIS) 80 MG tablet Take 1 tablet Daily for BP 90 tablet 1  . traZODone (DESYREL) 100 MG tablet Take 1/2 to 1 tablet 1 hour before  Bedtime for Sleep 30 tablet 0  . venlafaxine XR (EFFEXOR-XR) 37.5 MG 24 hr capsule TAKE 1 CAPSULE DAILY FOR MOOD 90 capsule 2   Current Facility-Administered Medications on File Prior to Visit  Medication Dose Route Frequency Provider Last Rate Last Admin  . dexamethasone (DECADRON) injection 10 mg  10 mg Intramuscular Once Garnet Sierras, NP       Medical History:  Past Medical History:  Diagnosis Date  . Allergy   . Anemia   . Anxiety   . Asthma   . Hypertension    Allergies:  Allergies  Allergen Reactions  . Ciprofloxacin Nausea Only    Headache  . Penicillins     Allergy has been more than 16yrs but unsure of what reaction was    Review of Systems:  Review of Systems  Constitutional: Negative.   HENT: Negative.   Eyes: Negative.   Respiratory: Negative.   Cardiovascular: Negative.   Gastrointestinal: Negative.   Genitourinary: Negative.   Musculoskeletal: Positive for back pain. Negative for falls, joint pain, myalgias and neck pain.  Skin: Negative.   Neurological: Negative.   Endo/Heme/Allergies: Negative.   Psychiatric/Behavioral: Negative for depression, hallucinations, memory loss, substance abuse and suicidal ideas. The patient is nervous/anxious. The patient does not have insomnia.     Family history- Review and unchanged Social history- Review and unchanged Physical Exam: There were no  vitals taken for this visit. Wt Readings from Last 3 Encounters:  06/14/19 170 lb 3.2 oz (77.2 kg)  06/03/19 168 lb (76.2 kg)  08/27/18 156 lb 3.2 oz (70.9 kg)   General Appearance: Well nourished, in no apparent distress. Eyes: PERRLA, EOMs, conjunctiva no swelling or erythema Sinuses: No Frontal/maxillary tenderness ENT/Mouth: Ext aud canals clear, TMs without erythema, bulging. No erythema, swelling, or exudate on post pharynx.  Tonsils not swollen or erythematous. Hearing normal.  Neck: Supple, thyroid normal.  Respiratory: Respiratory effort normal, BS equal bilaterally without rales, rhonchi, wheezing or stridor.  Cardio: RRR with no MRGs. Brisk peripheral  pulses without edema.  Abdomen: Soft, + BS,  Non tender, no guarding, rebound, hernias, masses. Lymphatics: Non tender without lymphadenopathy.  Musculoskeletal: Full ROM, 5/5 strength, Normal gait Skin: Warm, dry without rashes, lesions, ecchymosis.  Neuro: Cranial nerves intact. Normal muscle tone, no cerebellar symptoms. Psych: Awake and oriented X 3, normal affect, Insight and Judgment appropriate.    Vicie Mutters, PA-C 1:07 PM [facility]

## 2019-10-03 ENCOUNTER — Ambulatory Visit: Payer: 59 | Admitting: Physician Assistant

## 2019-10-26 MED ORDER — PHENTERMINE HCL 37.5 MG PO TABS: 37.5000 mg | ORAL_TABLET | Freq: Every day | ORAL | 2 refills | Status: DC

## 2019-10-31 NOTE — Progress Notes (Signed)
Assessment and Plan:  Essential hypertension - continue medications, DASH diet, exercise and monitor at home. Call if greater than 130/80.   Morbid Obesity with co morbidities - follow up 6 months for progress monitoring - increase veggies, decrease carbs - long discussion about weight loss, diet, and exercise -     phentermine (ADIPEX-P) 37.5 MG tablet; Take 1 tablet (37.5 mg total) by mouth daily before breakfast. - follow up at CPE  Hyperlipidemia, unspecified hyperlipidemia type -continue medications, check lipids, decrease fatty foods, increase activity. '  Insomnia, unspecified type -     traZODone (DESYREL) 50 MG tablet; 1/2-1 tablet for sleep    Continue diet and meds as discussed. Further disposition pending results of labs. Over 30 minutes of exam, counseling, chart review, and critical decision making was performed Future Appointments  Date Time Provider [place]  06/05/2020  9:00 AM Vicie Mutters, PA-C [facility] None    HPI 59 y.o. female  presents for 3 month follow up on hypertension, cholesterol, prediabetes, and vitamin D deficiency.   She lost her job, could not afford saxenda but she was doing really well with it, has lost 18 lbs, she is walking,, went back on the phentermine due to cost.  Mom passed 2017, dad passed Feb 11th 2020.   BMI is Body mass index is 27.36 kg/m., she is working on diet and exercise. Wt Readings from Last 3 Encounters:  11/02/19 152 lb (68.9 kg)  06/14/19 170 lb 3.2 oz (77.2 kg)  06/03/19 168 lb (76.2 kg)    Her blood pressure has been controlled at home, she is on atenolol at night 50mg , today their BP is BP: 128/86   She is on trazodone 50mg  at night to decrease xanax use at night, she is on xanax as needed, the most is once a day.   She does workout. She denies chest pain, shortness of breath, dizziness.  She is not on cholesterol medication and denies myalgias. Her cholesterol is at goal. The cholesterol last visit was:    Lab Results  Component Value Date   CHOL 195 06/03/2019   HDL 66 06/03/2019   LDLCALC 104 (H) 06/03/2019   TRIG 153 (H) 06/03/2019   CHOLHDL 3.0 06/03/2019   Last A1C in the office was:  Lab Results  Component Value Date   HGBA1C 5.1 06/03/2019   Patient is on Vitamin D supplement.   Lab Results  Component Value Date   VD25OH 62 06/03/2019     Current Medications:  Current Outpatient Medications on File Prior to Visit  Medication Sig Dispense Refill  . acyclovir (ZOVIRAX) 400 MG tablet Take 1 tablet 2 x  /day for Fever Blisters 180 tablet 3  . allopurinol (ZYLOPRIM) 300 MG tablet Take 1 tablet Daily to Prevent Gout 14 tablet 0  . ALPRAZolam (XANAX) 0.5 MG tablet Take 1 tablet (0.5 mg total) by mouth daily as needed for anxiety. 35 tablet 0  . Ascorbic Acid (VITAMIN C PO) Take by mouth daily.    Marland Kitchen atenolol (TENORMIN) 100 MG tablet Take 1 tablet Daily for BP - Absolutely NO MORE REFILLS until seen in Office 90 tablet 1  . cholecalciferol (VITAMIN D) 1000 units tablet Take 1,000 Units by mouth daily.    . furosemide (LASIX) 40 MG tablet Take 1 tablet Daily for BP & Fluid Retention / Ankle Swelling 90 tablet 1  . gabapentin (NEURONTIN) 100 MG capsule Take 1 capsule (100 mg total) by mouth 3 (three) times daily. [place]  capsule 2  . Insulin Pen Needle (NOVOFINE) 32G X 6 MM MISC Needs daily for saxenda 90 each 2  . MAGNESIUM PO Take 400 mg by mouth daily.     . milk thistle 175 MG tablet Take 175 mg by mouth daily.    . montelukast (SINGULAIR) 10 MG tablet TAKE 1 TABLET BY MOUTH AT  BEDTIME 90 tablet 3  . Omega-3 Fatty Acids (FISH OIL) 1000 MG CAPS Take 1,000 mg by mouth daily.    Marland Kitchen omeprazole (PRILOSEC) 20 MG capsule Take 20 mg by mouth daily.     . phentermine (ADIPEX-P) 37.5 MG tablet Take 1 tablet (37.5 mg total) by mouth daily before breakfast. 30 tablet 2  . PORTIA-28 0.15-30 MG-MCG tablet TAKE 1 TABLET BY MOUTH  DAILY 84 tablet 3  . telmisartan (MICARDIS) 80 MG tablet Take 1  tablet Daily for BP 90 tablet 1  . traZODone (DESYREL) 100 MG tablet Take 1/2 to 1 tablet 1 hour before  Bedtime for Sleep 30 tablet 0   Current Facility-Administered Medications on File Prior to Visit  Medication Dose Route Frequency Provider Last Rate Last Admin  . dexamethasone (DECADRON) injection 10 mg  10 mg Intramuscular Once Garnet Sierras, NP       Medical History:  Past Medical History:  Diagnosis Date  . Allergy   . Anemia   . Anxiety   . Asthma   . Hypertension    Allergies:  Allergies  Allergen Reactions  . Ciprofloxacin Nausea Only    Headache  . Penicillins     Allergy has been more than 20yrs but unsure of what reaction was    Review of Systems:  Review of Systems  Constitutional: Negative.   HENT: Negative.   Eyes: Negative.   Respiratory: Negative.   Cardiovascular: Negative.   Gastrointestinal: Negative.   Genitourinary: Negative.   Musculoskeletal: Positive for back pain. Negative for falls, joint pain, myalgias and neck pain.  Skin: Negative.   Neurological: Negative.   Endo/Heme/Allergies: Negative.   Psychiatric/Behavioral: Negative for depression, hallucinations, memory loss, substance abuse and suicidal ideas. The patient is nervous/anxious. The patient does not have insomnia.     Family history- Review and unchanged Social history- Review and unchanged Physical Exam: BP 128/86   Pulse 96   Temp 97.6 F (36.4 C)   Wt 152 lb (68.9 kg)   SpO2 97%   BMI 27.36 kg/m  Wt Readings from Last 3 Encounters:  11/02/19 152 lb (68.9 kg)  06/14/19 170 lb 3.2 oz (77.2 kg)  06/03/19 168 lb (76.2 kg)   General Appearance: Well nourished, in no apparent distress. Eyes: PERRLA, EOMs, conjunctiva no swelling or erythema Sinuses: No Frontal/maxillary tenderness ENT/Mouth: Ext aud canals clear, TMs without erythema, bulging. No erythema, swelling, or exudate on post pharynx.  Tonsils not swollen or erythematous. Hearing normal.  Neck: Supple, thyroid  normal.  Respiratory: Respiratory effort normal, BS equal bilaterally without rales, rhonchi, wheezing or stridor.  Cardio: RRR with no MRGs. Brisk peripheral pulses without edema.  Abdomen: Soft, + BS,  Non tender, no guarding, rebound, hernias, masses. Lymphatics: Non tender without lymphadenopathy.  Musculoskeletal: Full ROM, 5/5 strength, Normal gait Skin: Warm, dry without rashes, lesions, ecchymosis.  Neuro: Cranial nerves intact. Normal muscle tone, no cerebellar symptoms. Psych: Awake and oriented X 3, normal affect, Insight and Judgment appropriate.    Vicie Mutters, PA-C 10:10 AM [facility]

## 2019-11-01 ENCOUNTER — Other Ambulatory Visit: Payer: Self-pay | Admitting: Internal Medicine

## 2019-11-01 DIAGNOSIS — Z1231 Encounter for screening mammogram for malignant neoplasm of breast: Secondary | ICD-10-CM

## 2019-11-02 ENCOUNTER — Encounter: Payer: Self-pay | Admitting: Physician Assistant

## 2019-11-02 ENCOUNTER — Ambulatory Visit (INDEPENDENT_AMBULATORY_CARE_PROVIDER_SITE_OTHER): Payer: 59 | Admitting: Physician Assistant

## 2019-11-02 ENCOUNTER — Other Ambulatory Visit: Payer: Self-pay

## 2019-11-02 VITALS — BP 128/86 | HR 96 | Temp 97.6°F | Wt 152.0 lb

## 2019-11-02 DIAGNOSIS — D649 Anemia, unspecified: Secondary | ICD-10-CM

## 2019-11-02 DIAGNOSIS — I10 Essential (primary) hypertension: Secondary | ICD-10-CM

## 2019-11-02 DIAGNOSIS — E785 Hyperlipidemia, unspecified: Secondary | ICD-10-CM

## 2019-11-02 DIAGNOSIS — J45909 Unspecified asthma, uncomplicated: Secondary | ICD-10-CM

## 2019-11-02 DIAGNOSIS — F419 Anxiety disorder, unspecified: Secondary | ICD-10-CM

## 2019-11-02 DIAGNOSIS — E876 Hypokalemia: Secondary | ICD-10-CM

## 2019-11-02 DIAGNOSIS — E559 Vitamin D deficiency, unspecified: Secondary | ICD-10-CM

## 2019-11-02 NOTE — Patient Instructions (Signed)
Phentermine  While taking the medication we may ask that you come into the office once a month.  PLEASE BRING A FOOD LONG TO THE FIRST VISIT.   It is helpful if you bring in a food diary or use an app on your phone such as myfitnesspal to record your calorie intake, especially in the beginning.  After that first initial visit, we will want to see you once a month for accountability.  In addition we can help answer your questions about diet, exercise, and help you every step of the way with your weight loss journey.  HOW TO START THE MEDICATION You can start out on 1/2 a pill in the morning  FOR THE FIRST MONTH.  WE WILL THEN INCREASE TO 1 PILL AFTER THAT 1ST VISIT.   COST OF THE MEDICATION This medication is cheapest CASH pay at [facility] is 14-17 dollars and you do NOT need a membership to get meds from there.   SIDE EFFECTS It causes dry mouth and constipation in almost every patient, so try to get 80-100 oz of water a day and increase fiber such as veggies. You can add on a stool softener if you would like.   It can give you energy however it can also cause some people to be shaky, anxious or have palpitations. Stop this medication if that happens and contact the office.   If this medication does not work for you there are several medications that we can try to help rewire your brain in addition to making healthier habits.   What is this medicine? PHENTERMINE (FEN ter meen) decreases your appetite. This medicine is intended to be used in addition to a healthy reduced calorie diet and exercise. The best results are achieved this way. This medicine is only indicated for short-term use. Eventually your weight loss may level out and the medication will no longer be needed.   How should I use this medicine? Take this medicine by mouth. Follow the directions on the prescription label. The tablets should stay in the bottle until immediately before you take your dose.  Take your doses at regular intervals. Do not take your medicine more often than directed.  Overdosage: If you think you have taken too much of this medicine contact a poison control center or emergency room at once. NOTE: This medicine is only for you. Do not share this medicine with others.  What if I miss a dose? If you miss a dose, take it as soon as you can. If it is almost time for your next dose, take only that dose. Do not take double or extra doses. Do not increase or in any way change your dose without consulting your doctor.  What should I watch for while using this medicine? Notify your physician immediately if you become short of breath while doing your normal activities. Do not take this medicine within 6 hours of bedtime. It can keep you from getting to sleep. Avoid drinks that contain caffeine and try to stick to a regular bedtime every night. Do not stand or sit up quickly, especially if you are an older patient. This reduces the risk of dizzy or fainting spells. Avoid alcoholic drinks.  What side effects may I notice from receiving this medicine? Side effects that you should report to your doctor or health care professional as soon as possible: -chest pain, palpitations -depression or severe changes in mood -increased blood pressure -irritability -nervousness or restlessness -severe dizziness -  shortness of breath -problems urinating -unusual swelling of the legs -vomiting  Side effects that usually do not require medical attention (report to your doctor or health care professional if they continue or are bothersome): -blurred vision or other eye problems -changes in sexual ability or desire -constipation or diarrhea -difficulty sleeping -dry mouth or unpleasant taste -headache -nausea This list may not describe all possible side effects. Call your doctor for medical advice about side effects. You may report side effects to FDA at 1-800-FDA-1088.

## 2019-11-21 ENCOUNTER — Other Ambulatory Visit: Payer: Self-pay | Admitting: Internal Medicine

## 2019-12-02 ENCOUNTER — Other Ambulatory Visit: Payer: Self-pay | Admitting: Internal Medicine

## 2019-12-13 MED ORDER — ATENOLOL 100 MG PO TABS: ORAL_TABLET | ORAL | 0 refills | Status: DC

## 2019-12-20 ENCOUNTER — Other Ambulatory Visit: Payer: Self-pay | Admitting: Physician Assistant

## 2019-12-27 ENCOUNTER — Other Ambulatory Visit: Payer: Self-pay | Admitting: Physician Assistant

## 2019-12-27 DIAGNOSIS — Z1231 Encounter for screening mammogram for malignant neoplasm of breast: Secondary | ICD-10-CM

## 2020-01-03 ENCOUNTER — Other Ambulatory Visit: Payer: Self-pay | Admitting: Physician Assistant

## 2020-01-05 ENCOUNTER — Ambulatory Visit
Admission: RE | Admit: 2020-01-05 | Discharge: 2020-01-05 | Disposition: A | Discharge status: Home / Self Care | Payer: 59 | Source: Ambulatory Visit

## 2020-01-05 ENCOUNTER — Other Ambulatory Visit: Payer: Self-pay

## 2020-01-05 DIAGNOSIS — Z1231 Encounter for screening mammogram for malignant neoplasm of breast: Secondary | ICD-10-CM

## 2020-01-07 ENCOUNTER — Other Ambulatory Visit: Payer: Self-pay | Admitting: Physician Assistant

## 2020-02-02 ENCOUNTER — Other Ambulatory Visit: Payer: Self-pay | Admitting: Internal Medicine

## 2020-02-10 ENCOUNTER — Encounter: Payer: Self-pay | Admitting: Physician Assistant

## 2020-02-10 ENCOUNTER — Other Ambulatory Visit: Payer: Self-pay

## 2020-02-10 ENCOUNTER — Ambulatory Visit (INDEPENDENT_AMBULATORY_CARE_PROVIDER_SITE_OTHER): Payer: 59 | Admitting: Physician Assistant

## 2020-02-10 DIAGNOSIS — L820 Inflamed seborrheic keratosis: Secondary | ICD-10-CM

## 2020-02-10 DIAGNOSIS — L814 Other melanin hyperpigmentation: Secondary | ICD-10-CM

## 2020-02-10 DIAGNOSIS — D229 Melanocytic nevi, unspecified: Secondary | ICD-10-CM

## 2020-02-10 DIAGNOSIS — Z1283 Encounter for screening for malignant neoplasm of skin: Secondary | ICD-10-CM

## 2020-02-10 DIAGNOSIS — L821 Other seborrheic keratosis: Secondary | ICD-10-CM

## 2020-02-10 DIAGNOSIS — D485 Neoplasm of uncertain behavior of skin: Secondary | ICD-10-CM | POA: Diagnosis not present

## 2020-02-10 DIAGNOSIS — L578 Other skin changes due to chronic exposure to nonionizing radiation: Secondary | ICD-10-CM

## 2020-02-10 DIAGNOSIS — D1800 Hemangioma unspecified site: Secondary | ICD-10-CM

## 2020-02-10 MED ORDER — CLOBETASOL PROPIONATE 0.05 % EX SOLN: 1.0000 "application " | Freq: Two times a day (BID) | CUTANEOUS | 3 refills | Status: DC

## 2020-02-10 NOTE — Patient Instructions (Signed)
Biopsy, Surgery (Curettage) & Surgery (Excision) Aftercare Instructions  1. Okay to remove bandage in 24 hours  2. Wash area with soap and water  3. Apply Vaseline to area twice daily until healed (Not Neosporin)  4. Okay to cover with a Band-Aid to decrease the chance of infection or prevent irritation from clothing; also it's okay to uncover lesion at home.  5. Suture instructions: return to our office in 7-10 or 10-14 days for a nurse visit for suture removal. Variable healing with sutures, if pain or itching occurs call our office. It's okay to shower or bathe 24 hours after sutures are given.  6. The following risks may occur after a biopsy, curettage or excision: bleeding, scarring, discoloration, recurrence, infection (redness, yellow drainage, pain or swelling).  7. If your results are positive we will contact you, if you do not hear from [place] review your MyChart.  Stay Well

## 2020-02-14 ENCOUNTER — Encounter: Payer: Self-pay | Admitting: Physician Assistant

## 2020-02-15 NOTE — Addendum Note (Signed)
Addended by: Robyne Askew R on: 02/15/2020 10:08 AM   Modules accepted: Level of Service

## 2020-02-15 NOTE — Progress Notes (Addendum)
New Patient   Subjective  Valerie Hobbs is a 59 y.o. female who presents for the following: Skin Problem (scalp psoriasis).   The following portions of the chart were reviewed this encounter and updated as appropriate: Tobacco  Allergies  Meds  Problems  Med Hx  Surg Hx  Fam Hx      Objective  Well appearing patient in no apparent distress; mood and affect are within normal limits.  All skin waist up examined.  Objective  Mid Frontal Scalp: Erythematous stuck-on, waxy papule or plaque.   Objective  Left Inframammary Fold: Thick, crusty plaque     Neck - Anterior     Right Inframammary Fold     Objective  scalp (2): Large thick crusts. Skin toned  Objective  waist up: No atypical nevi No signs of non-mole skin cancer.   Assessment & Plan  Inflamed seborrheic keratosis Mid Frontal Scalp  Destruction of lesion - Mid Frontal Scalp  Ordered Medications: clobetasol (TEMOVATE) 0.05 % external solution  Neoplasm of uncertain behavior of skin (3) Left Inframammary Fold  Skin / nail biopsy Type of biopsy: tangential   Informed consent: discussed and consent obtained   Timeout: patient name, date of birth, surgical site, and procedure verified   Anesthesia: the lesion was anesthetized in a standard fashion   Anesthetic:  1% lidocaine w/ epinephrine 1-100,000 local infiltration Instrument used: flexible razor blade   Hemostasis achieved with: aluminum chloride and electrodesiccation   Outcome: patient tolerated procedure well   Post-procedure details: wound care instructions given    Specimen 1 - Surgical pathology Differential Diagnosis: ISK Check Margins: No  Neck - Anterior  Skin / nail biopsy Type of biopsy: tangential   Informed consent: discussed and consent obtained   Timeout: patient name, date of birth, surgical site, and procedure verified   Anesthesia: the lesion was anesthetized in a standard fashion   Anesthetic:  1%  lidocaine w/ epinephrine 1-100,000 local infiltration Instrument used: flexible razor blade   Hemostasis achieved with: aluminum chloride and electrodesiccation   Outcome: patient tolerated procedure well   Post-procedure details: wound care instructions given    Specimen 2 - Surgical pathology Differential Diagnosis: ISK Check Margins: No  Right Inframammary Fold  Skin / nail biopsy Type of biopsy: tangential   Informed consent: discussed and consent obtained   Timeout: patient name, date of birth, surgical site, and procedure verified   Anesthesia: the lesion was anesthetized in a standard fashion   Anesthetic:  1% lidocaine w/ epinephrine 1-100,000 local infiltration Instrument used: flexible razor blade   Hemostasis achieved with: aluminum chloride and electrodesiccation   Outcome: patient tolerated procedure well   Post-procedure details: wound care instructions given    Specimen 3 - Surgical pathology Differential Diagnosis: ISK Check Margins: No  Seborrheic keratosis (2) scalp  Screening exam for skin cancer waist up  Skin examinations Lentigines - Scattered tan macules - Discussed due to sun exposure - Benign, observe - Call for any changes  Seborrheic Keratoses - Stuck-on, waxy, tan-brown papules and plaques  - Discussed benign etiology and prognosis. - Observe - Call for any changes  Melanocytic Nevi - Tan-brown and/or pink-flesh-colored symmetric macules and papules - Benign appearing on exam today - Observation - Call clinic for new or changing moles - Recommend daily use of broad spectrum spf 30+ sunscreen to sun-exposed areas.   Hemangiomas - Red papules - Discussed benign nature - Observe - Call for any changes  Actinic Damage - diffuse  scaly erythematous macules with underlying dyspigmentation - Recommend daily broad spectrum sunscreen SPF 30+ to sun-exposed areas, reapply every 2 hours as needed.  - Call for new or changing  lesions.  Skin cancer screening performed today.    I,Virtie Bungert,acting as a scribe for [employer], PA-C.,have documented all relevant documentation on the behalf of Aurianna Earlywine, PA-C,as directed by  [facility], PA-C while in the presence of Azora Bonzo, PA-C.

## 2020-02-16 ENCOUNTER — Other Ambulatory Visit: Payer: Self-pay | Admitting: Internal Medicine

## 2020-02-21 ENCOUNTER — Encounter: Payer: Self-pay | Admitting: Physician Assistant

## 2020-02-21 MED ORDER — DOXYCYCLINE HYCLATE 100 MG PO TABS
100.0000 mg | ORAL_TABLET | Freq: Two times a day (BID) | ORAL | 0 refills | Status: AC
Start: 2020-02-21 — End: 2020-03-02

## 2020-02-22 ENCOUNTER — Other Ambulatory Visit: Payer: Self-pay | Admitting: Internal Medicine

## 2020-03-28 ENCOUNTER — Other Ambulatory Visit: Payer: Self-pay | Admitting: Internal Medicine

## 2020-05-29 ENCOUNTER — Other Ambulatory Visit: Payer: Self-pay | Admitting: Internal Medicine

## 2020-05-31 ENCOUNTER — Other Ambulatory Visit: Payer: Self-pay | Admitting: Adult Health

## 2020-06-05 ENCOUNTER — Encounter: Payer: 59 | Admitting: Adult Health Nurse Practitioner

## 2020-06-21 ENCOUNTER — Other Ambulatory Visit: Payer: Self-pay | Admitting: Internal Medicine

## 2020-06-27 ENCOUNTER — Encounter: Payer: Self-pay | Admitting: Physician Assistant

## 2020-07-10 ENCOUNTER — Encounter: Payer: 59 | Admitting: Adult Health Nurse Practitioner

## 2020-07-12 ENCOUNTER — Other Ambulatory Visit: Payer: Self-pay

## 2020-07-12 ENCOUNTER — Emergency Department (HOSPITAL_BASED_OUTPATIENT_CLINIC_OR_DEPARTMENT_OTHER): Payer: 59

## 2020-07-12 ENCOUNTER — Emergency Department (HOSPITAL_BASED_OUTPATIENT_CLINIC_OR_DEPARTMENT_OTHER)
Admission: EM | Admit: 2020-07-12 | Discharge: 2020-07-12 | Discharge status: Against Medical Advice | Payer: 59 | Attending: Emergency Medicine | Admitting: Emergency Medicine

## 2020-07-12 ENCOUNTER — Encounter (HOSPITAL_BASED_OUTPATIENT_CLINIC_OR_DEPARTMENT_OTHER): Payer: Self-pay

## 2020-07-12 DIAGNOSIS — R202 Paresthesia of skin: Secondary | ICD-10-CM | POA: Diagnosis present

## 2020-07-12 DIAGNOSIS — I10 Essential (primary) hypertension: Secondary | ICD-10-CM | POA: Insufficient documentation

## 2020-07-12 DIAGNOSIS — Z79899 Other long term (current) drug therapy: Secondary | ICD-10-CM | POA: Diagnosis not present

## 2020-07-12 DIAGNOSIS — R200 Anesthesia of skin: Secondary | ICD-10-CM

## 2020-07-12 DIAGNOSIS — J45909 Unspecified asthma, uncomplicated: Secondary | ICD-10-CM | POA: Diagnosis not present

## 2020-07-12 LAB — PROTIME-INR
INR: 1 (ref 0.8–1.2)
Prothrombin Time: 13 seconds (ref 11.4–15.2)

## 2020-07-12 LAB — DIFFERENTIAL
Abs Immature Granulocytes: 0.03 10*3/uL (ref 0.00–0.07)
Basophils Absolute: 0.1 10*3/uL (ref 0.0–0.1)
Basophils Relative: 1 %
Eosinophils Absolute: 0.3 10*3/uL (ref 0.0–0.5)
Eosinophils Relative: 3 %
Immature Granulocytes: 0 %
Lymphocytes Relative: 21 %
Lymphs Abs: 2.2 10*3/uL (ref 0.7–4.0)
Monocytes Absolute: 0.9 10*3/uL (ref 0.1–1.0)
Monocytes Relative: 9 %
Neutro Abs: 6.8 10*3/uL (ref 1.7–7.7)
Neutrophils Relative %: 66 %

## 2020-07-12 LAB — CBC
HCT: 38.2 % (ref 36.0–46.0)
Hemoglobin: 13.1 g/dL (ref 12.0–15.0)
MCH: 34.9 pg — ABNORMAL HIGH (ref 26.0–34.0)
MCHC: 34.3 g/dL (ref 30.0–36.0)
MCV: 101.9 fL — ABNORMAL HIGH (ref 80.0–100.0)
Platelets: 279 10*3/uL (ref 150–400)
RBC: 3.75 MIL/uL — ABNORMAL LOW (ref 3.87–5.11)
RDW: 13.4 % (ref 11.5–15.5)
WBC: 10.4 10*3/uL (ref 4.0–10.5)
nRBC: 0 % (ref 0.0–0.2)

## 2020-07-12 LAB — COMPREHENSIVE METABOLIC PANEL
ALT: 20 U/L (ref 0–44)
AST: 33 U/L (ref 15–41)
Albumin: 4 g/dL (ref 3.5–5.0)
Alkaline Phosphatase: 43 U/L (ref 38–126)
Anion gap: 11 (ref 5–15)
BUN: 11 mg/dL (ref 6–20)
CO2: 23 mmol/L (ref 22–32)
Calcium: 8.8 mg/dL — ABNORMAL LOW (ref 8.9–10.3)
Chloride: 104 mmol/L (ref 98–111)
Creatinine, Ser: 0.81 mg/dL (ref 0.44–1.00)
GFR, Estimated: >60 mL/min (ref >60)
Glucose, Bld: 101 mg/dL — ABNORMAL HIGH (ref 70–99)
Potassium: 3.2 mmol/L — ABNORMAL LOW (ref 3.5–5.1)
Sodium: 138 mmol/L (ref 135–145)
Total Bilirubin: 0.3 mg/dL (ref 0.3–1.2)
Total Protein: 7.1 g/dL (ref 6.5–8.1)

## 2020-07-12 LAB — URINALYSIS, ROUTINE W REFLEX MICROSCOPIC
Bilirubin Urine: NEGATIVE
Glucose, UA: NEGATIVE mg/dL
Ketones, ur: NEGATIVE mg/dL
Leukocytes,Ua: NEGATIVE
Nitrite: NEGATIVE
Protein, ur: NEGATIVE mg/dL
Specific Gravity, Urine: 1.015 (ref 1.005–1.030)
pH: 7 (ref 5.0–8.0)

## 2020-07-12 LAB — PREGNANCY, URINE: Preg Test, Ur: NEGATIVE

## 2020-07-12 LAB — APTT: aPTT: 27 seconds (ref 24–36)

## 2020-07-12 LAB — URINALYSIS, MICROSCOPIC (REFLEX)

## 2020-07-12 LAB — RAPID URINE DRUG SCREEN, HOSP PERFORMED
Amphetamines: NOT DETECTED
Barbiturates: NOT DETECTED
Benzodiazepines: NOT DETECTED
Cocaine: NOT DETECTED
Opiates: NOT DETECTED
Tetrahydrocannabinol: POSITIVE — AB

## 2020-07-12 LAB — ETHANOL: Alcohol, Ethyl (B): <10 mg/dL (ref <10)

## 2020-07-12 NOTE — ED Notes (Signed)
ED Provider at bedside. 

## 2020-07-12 NOTE — ED Notes (Signed)
Pt states she would like to leave and schedule her own MRI. EDP made aware.

## 2020-07-12 NOTE — ED Triage Notes (Signed)
Pt states she woke up yesterday morning and the entire R side of her body is numb. Pt states she felt off balance yesterday morning.

## 2020-07-12 NOTE — ED Provider Notes (Signed)
[place] EMERGENCY DEPARTMENT Provider Note  CSN: [id number] Arrival date & time: 07/12/20 1030    History Chief Complaint  Patient presents with  . Numbness    HPI  Valerie Hobbs is a 60 y.o. female with history of HTN reports she woke up from sleep around 3am the night before last feeling cold but she realized she was actually having numbness on the right side of her body from her head to her toes. She has continued to have symptoms through the day yesterday. She reports some issues with grip strength on that side. No prior history of same. She denies any left sided symptoms. She denies any facial droop, slurred speech. She has felt off balance. She admits to daily EtOH use (2 glasses of wine in the evening). Denies any tobacco or drug use.    Past Medical History:  Diagnosis Date  . Allergy   . Anemia   . Anxiety   . Asthma   . Hypertension     History reviewed. No pertinent surgical history.  Family History  Problem Relation Age of Onset  . Hypertension Mother   . Diabetes Mother   . Thyroid disease Mother   . Cataracts Mother   . Cirrhosis Mother        fatty liver  . Diabetes Father   . Hypertension Father   . Heart disease Father        bypass  . Dementia Father        LEWY BODY    Social History   Tobacco Use  . Smoking status: Never Smoker  . Smokeless tobacco: Never Used  Vaping Use  . Vaping Use: Never used  Substance Use Topics  . Alcohol use: Yes    Alcohol/week: 14.0 standard drinks    Types: 14 Standard drinks or equivalent per week    Comment: daily  . Drug use: Yes    Types: Marijuana     Home Medications Prior to Admission medications   Medication Sig Start Date End Date Taking? Authorizing Provider  allopurinol (ZYLOPRIM) 300 MG tablet TAKE 1/2 TO 1 TAB BY MOUTH EVERY DAY TO PREVENT GOUT 02/22/20  Yes Liane Comber, NP  atenolol (TENORMIN) 100 MG tablet TAKE 1 TABLET BY MOUTH  DAILY FOR BLOOD PRESSURE 02/17/20   Yes Liane Comber, NP  cholecalciferol (VITAMIN D) 1000 units tablet Take 1,000 Units by mouth daily.   Yes [provider]  furosemide (LASIX) 40 MG tablet TAKE 1 TABLET BY MOUTH  DAILY FOR BLOOD PRESSURE  AND FLUID RETENTION / ANKLE SWELLING 12/03/19  Yes Vladimir Crofts, PA-C  MAGNESIUM PO Take 400 mg by mouth daily.    Yes [provider]  milk thistle 175 MG tablet Take 175 mg by mouth daily.   Yes [provider]  montelukast (SINGULAIR) 10 MG tablet Take    1 tablet    Daily      for Allergies 05/31/20  Yes Unk Pinto, MD  telmisartan (MICARDIS) 80 MG tablet TAKE 1 TABLET DAILY FOR  BLOOD PRESSURE 11/22/19  Yes Vicie Mutters R, PA-C  traZODone (DESYREL) 100 MG tablet TAKE 1/2 TO 1 TABLET BY MOUTH 1 HOUR BEFORE BEDTIME IF NEEDED FOR SLEEP 06/21/20  Yes Liane Comber, NP  acyclovir (ZOVIRAX) 400 MG tablet TAKE 1 TABLET 2 X /DAY FOR FEVER BLISTERS 05/29/20   Unk Pinto, MD  ALPRAZolam Duanne Moron) 0.5 MG tablet Take 1 tablet (0.5 mg total) by mouth daily as needed for anxiety.  Patient not taking: Reported on 02/10/2020 08/27/18   Vladimir Crofts, PA-C  Ascorbic Acid (VITAMIN C PO) Take by mouth daily.    [provider]  clobetasol (TEMOVATE) 0.05 % external solution Apply 1 application topically 2 (two) times daily. 02/10/20   Sheffield, Vida Roller R, PA-C  gabapentin (NEURONTIN) 100 MG capsule Take 1 capsule (100 mg total) by mouth 3 (three) times daily. 06/20/19 06/19/20  Garnet Sierras, NP  Insulin Pen Needle (NOVOFINE) 32G X 6 MM MISC Needs daily for saxenda Patient not taking: Reported on 02/10/2020 06/06/19   Vladimir Crofts, PA-C  Omega-3 Fatty Acids (FISH OIL) 1000 MG CAPS Take 1,000 mg by mouth daily.    [provider]  omeprazole (PRILOSEC) 20 MG capsule Take 20 mg by mouth daily.     [provider]  phentermine (ADIPEX-P) 37.5 MG tablet Take 1 tablet (37.5 mg total) by mouth daily before breakfast. 10/26/19   Vladimir Crofts, [place]  PORTIA-28 0.15-30 MG-MCG tablet TAKE 1 TABLET BY MOUTH  DAILY 07/25/19   Vladimir Crofts, PA-C     Allergies    Ciprofloxacin and Penicillins   Review of Systems   Review of Systems A comprehensive review of systems was completed and negative except as noted in HPI.    Physical Exam BP (!) 149/90   Pulse 73   Temp 97.9 F (36.6 C) (Oral)   Resp 14   Ht 5\' 2"  (1.575 m)   Wt 72.6 kg   SpO2 97%   BMI 29.26 kg/m   Physical Exam Vitals and nursing note reviewed.  Constitutional:      Appearance: Normal appearance.  HENT:     Head: Normocephalic and atraumatic.     Nose: Nose normal.     Mouth/Throat:     Mouth: Mucous membranes are moist.  Eyes:     Extraocular Movements: Extraocular movements intact.     Conjunctiva/sclera: Conjunctivae normal.  Cardiovascular:     Rate and Rhythm: Normal rate.  Pulmonary:     Effort: Pulmonary effort is normal.     Breath sounds: Normal breath sounds.  Abdominal:     General: Abdomen is flat.     Palpations: Abdomen is soft.     Tenderness: There is no abdominal tenderness.  Musculoskeletal:        General: No swelling. Normal range of motion.     Cervical back: Neck supple.  Skin:    General: Skin is warm and dry.  Neurological:     Mental Status: She is alert.     Comments: Decreased sensation to R side of face, otherwise no CN deficits. She has normal strength of all four extremities proximal and distal. There is decreased sensation of R arm and leg. Normal finger to nose.   Psychiatric:        Mood and Affect: Mood normal.      ED Results / Procedures / Treatments   Labs (all labs ordered are listed, but only abnormal results are displayed) Labs Reviewed  CBC - Abnormal; Notable for the following components:      Result Value   RBC 3.75 (*)    MCV 101.9 (*)    MCH 34.9 (*)    All other components within normal limits  COMPREHENSIVE METABOLIC PANEL - Abnormal; Notable for the following  components:   Potassium 3.2 (*)    Glucose, Bld 101 (*)    Calcium 8.8 (*)    All other components within  normal limits  RAPID URINE DRUG SCREEN, HOSP PERFORMED - Abnormal; Notable for the following components:   Tetrahydrocannabinol POSITIVE (*)    All other components within normal limits  URINALYSIS, ROUTINE W REFLEX MICROSCOPIC - Abnormal; Notable for the following components:   Hgb urine dipstick MODERATE (*)    All other components within normal limits  URINALYSIS, MICROSCOPIC (REFLEX) - Abnormal; Notable for the following components:   Bacteria, UA RARE (*)    All other components within normal limits  RESP PANEL BY RT-PCR (FLU A&B, COVID) [id number]  ETHANOL  PROTIME-INR  APTT  DIFFERENTIAL  PREGNANCY, URINE    EKG EKG Interpretation  Date/Time:  Thursday July 12 2020 11:10:18 EST Ventricular Rate:  73 PR Interval:    QRS Duration: 89 QT Interval:  401 QTC Calculation: 442 R Axis:   47 Text Interpretation: Sinus rhythm Low voltage, precordial leads No old tracing to compare Confirmed by Calvert Cantor [phone]) on 07/12/2020 11:12:32 AM    Radiology CT HEAD WO CONTRAST  Result Date: 07/12/2020 CLINICAL DATA:  Right-sided numbness EXAM: CT HEAD WITHOUT CONTRAST TECHNIQUE: Contiguous axial images were obtained from the base of the skull through the vertex without intravenous contrast. COMPARISON:  None. FINDINGS: Brain: No evidence of acute infarction, hemorrhage, hydrocephalus, extra-axial collection or mass lesion/mass effect. Cavum septum pellucidum. Scattered low-density changes within the periventricular and subcortical white matter compatible with chronic microvascular ischemic change. Mild diffuse cerebral volume loss. Vascular: No hyperdense vessel or unexpected calcification. Skull: Normal. Negative for fracture or focal lesion. Sinuses/Orbits: No acute finding. Other: None. IMPRESSION: 1. No acute intracranial findings. 2. Chronic microvascular ischemic change and  cerebral volume loss. Electronically Signed   By: Davina Poke D.O.   On: 07/12/2020 11:24    Procedures Procedures  Medications Ordered in the ED Medications - No data to display   MDM Rules/Calculators/A&P MDM Patient with symptoms concerning for stroke. She is outside the window for tPA. Will check labs, EKG and head CT.  ED Course  I have reviewed the triage vital signs and the nursing notes.  Pertinent labs & imaging results that were available during my care of the patient were reviewed by me and considered in my medical decision making (see chart for details).  Clinical Course as of 07/12/20 1341  Thu Jul 12, 2020  1139 CBC is unremarkable. CT head reviewed, neg for acute process.  [CS]  1200 CMP and EtOH unremarkable.  [CS]  1093 Spoke with Dr. Rory Percy, Neurology, who recommends patient go to [facility] ED for MRI.  [CS]  1319 Spoke with Dr. Reather Converse, [place] who will accept for transfer.  [CS]  1339 Patient now stating she does not want to go to [facility] for MRI and will scheduled one on her own as an outpatient. She will sign out AMA.  [CS]    Clinical Course User Index [CS] Truddie Hidden, MD    Final Clinical Impression(s) / ED Diagnoses Final diagnoses:  Numbness    Rx / DC Orders ED Discharge Orders    None       Truddie Hidden, MD 07/12/20 1341

## 2020-07-12 NOTE — ED Notes (Signed)
Patient transported to CT 

## 2020-07-13 ENCOUNTER — Observation Stay (HOSPITAL_COMMUNITY): Payer: 59

## 2020-07-13 ENCOUNTER — Emergency Department (HOSPITAL_COMMUNITY): Payer: 59

## 2020-07-13 ENCOUNTER — Encounter (HOSPITAL_COMMUNITY): Payer: Self-pay

## 2020-07-13 ENCOUNTER — Observation Stay (HOSPITAL_COMMUNITY)
Admission: EM | Admit: 2020-07-13 | Discharge: 2020-07-14 | Disposition: A | Discharge status: Home / Self Care | Payer: 59 | Attending: Internal Medicine | Admitting: Internal Medicine

## 2020-07-13 DIAGNOSIS — J45909 Unspecified asthma, uncomplicated: Secondary | ICD-10-CM | POA: Insufficient documentation

## 2020-07-13 DIAGNOSIS — I639 Cerebral infarction, unspecified: Secondary | ICD-10-CM

## 2020-07-13 DIAGNOSIS — Z79899 Other long term (current) drug therapy: Secondary | ICD-10-CM | POA: Insufficient documentation

## 2020-07-13 DIAGNOSIS — I63412 Cerebral infarction due to embolism of left middle cerebral artery: Principal | ICD-10-CM | POA: Insufficient documentation

## 2020-07-13 DIAGNOSIS — I6381 Other cerebral infarction due to occlusion or stenosis of small artery: Secondary | ICD-10-CM

## 2020-07-13 DIAGNOSIS — I10 Essential (primary) hypertension: Secondary | ICD-10-CM | POA: Diagnosis not present

## 2020-07-13 DIAGNOSIS — R202 Paresthesia of skin: Secondary | ICD-10-CM | POA: Diagnosis present

## 2020-07-13 HISTORY — DX: Other cerebral infarction due to occlusion or stenosis of small artery: I63.81

## 2020-07-13 HISTORY — DX: Cerebral infarction, unspecified: I63.9

## 2020-07-13 LAB — CBC WITH DIFFERENTIAL/PLATELET
Abs Immature Granulocytes: 0.03 10*3/uL (ref 0.00–0.07)
Basophils Absolute: 0.1 10*3/uL (ref 0.0–0.1)
Basophils Relative: 2 %
Eosinophils Absolute: 0.4 10*3/uL (ref 0.0–0.5)
Eosinophils Relative: 6 %
HCT: 38.4 % (ref 36.0–46.0)
Hemoglobin: 13.1 g/dL (ref 12.0–15.0)
Immature Granulocytes: 0 %
Lymphocytes Relative: 27 %
Lymphs Abs: 2 10*3/uL (ref 0.7–4.0)
MCH: 34.9 pg — ABNORMAL HIGH (ref 26.0–34.0)
MCHC: 34.1 g/dL (ref 30.0–36.0)
MCV: 102.4 fL — ABNORMAL HIGH (ref 80.0–100.0)
Monocytes Absolute: 0.8 10*3/uL (ref 0.1–1.0)
Monocytes Relative: 10 %
Neutro Abs: 4.1 10*3/uL (ref 1.7–7.7)
Neutrophils Relative %: 55 %
Platelets: 287 10*3/uL (ref 150–400)
RBC: 3.75 MIL/uL — ABNORMAL LOW (ref 3.87–5.11)
RDW: 13.7 % (ref 11.5–15.5)
WBC: 7.5 10*3/uL (ref 4.0–10.5)
nRBC: 0 % (ref 0.0–0.2)

## 2020-07-13 LAB — BASIC METABOLIC PANEL
Anion gap: 11 (ref 5–15)
BUN: 11 mg/dL (ref 6–20)
CO2: 23 mmol/L (ref 22–32)
Calcium: 9.1 mg/dL (ref 8.9–10.3)
Chloride: 107 mmol/L (ref 98–111)
Creatinine, Ser: 0.84 mg/dL (ref 0.44–1.00)
GFR, Estimated: >60 mL/min (ref >60)
Glucose, Bld: 97 mg/dL (ref 70–99)
Potassium: 3.9 mmol/L (ref 3.5–5.1)
Sodium: 141 mmol/L (ref 135–145)

## 2020-07-13 LAB — VITAMIN B12: Vitamin B-12: 1399 pg/mL — ABNORMAL HIGH (ref 180–914)

## 2020-07-13 LAB — HIV ANTIBODY (ROUTINE TESTING W REFLEX): HIV Screen 4th Generation wRfx: NONREACTIVE

## 2020-07-13 LAB — MAGNESIUM: Magnesium: 2.1 mg/dL (ref 1.7–2.4)

## 2020-07-13 MED ORDER — ACETAMINOPHEN 650 MG RE SUPP: 650.0000 mg | RECTAL | Status: DC | PRN

## 2020-07-13 MED ORDER — ASPIRIN EC 81 MG PO TBEC
81.0000 mg | DELAYED_RELEASE_TABLET | Freq: Every day | ORAL | Status: DC
  Administered 2020-07-13 – 2020-07-14 (×2): 81 mg via ORAL
  Filled 2020-07-13 (×2): qty 1

## 2020-07-13 MED ORDER — ACETAMINOPHEN 325 MG PO TABS: 650.0000 mg | ORAL_TABLET | ORAL | Status: DC | PRN

## 2020-07-13 MED ORDER — ASCORBIC ACID 500 MG PO TABS
500.0000 mg | ORAL_TABLET | Freq: Every day | ORAL | Status: DC
  Administered 2020-07-13 – 2020-07-14 (×2): 500 mg via ORAL
  Filled 2020-07-13 (×2): qty 1

## 2020-07-13 MED ORDER — ENOXAPARIN SODIUM 40 MG/0.4ML ~~LOC~~ SOLN
40.0000 mg | SUBCUTANEOUS | Status: DC
  Administered 2020-07-13: 40 mg via SUBCUTANEOUS
  Filled 2020-07-13: qty 0.4

## 2020-07-13 MED ORDER — HYDRALAZINE HCL 25 MG PO TABS: 25.0000 mg | ORAL_TABLET | Freq: Four times a day (QID) | ORAL | Status: DC | PRN

## 2020-07-13 MED ORDER — SENNOSIDES-DOCUSATE SODIUM 8.6-50 MG PO TABS: 1.0000 | ORAL_TABLET | Freq: Every evening | ORAL | Status: DC | PRN

## 2020-07-13 MED ORDER — OMEGA-3-ACID ETHYL ESTERS 1 G PO CAPS
1.0000 g | ORAL_CAPSULE | Freq: Every day | ORAL | Status: DC
  Administered 2020-07-13 – 2020-07-14 (×2): 1 g via ORAL
  Filled 2020-07-13 (×2): qty 1

## 2020-07-13 MED ORDER — MILK THISTLE 175 MG PO TABS: 175.0000 mg | ORAL_TABLET | Freq: Every day | ORAL | Status: DC

## 2020-07-13 MED ORDER — LEVONORGESTREL-ETHINYL ESTRAD 0.15-30 MG-MCG PO TABS: 1.0000 | ORAL_TABLET | Freq: Every day | ORAL | Status: DC

## 2020-07-13 MED ORDER — CLOPIDOGREL BISULFATE 75 MG PO TABS
75.0000 mg | ORAL_TABLET | Freq: Every day | ORAL | Status: DC
  Administered 2020-07-13 – 2020-07-14 (×2): 75 mg via ORAL
  Filled 2020-07-13 (×2): qty 1

## 2020-07-13 MED ORDER — TRAZODONE HCL 100 MG PO TABS
100.0000 mg | ORAL_TABLET | Freq: Every evening | ORAL | Status: DC | PRN
  Administered 2020-07-13: 100 mg via ORAL
  Filled 2020-07-13: qty 1

## 2020-07-13 MED ORDER — CLOBETASOL PROPIONATE 0.05 % EX CREA
1.0000 "application " | TOPICAL_CREAM | Freq: Two times a day (BID) | CUTANEOUS | Status: DC
  Filled 2020-07-13: qty 15

## 2020-07-13 MED ORDER — PANTOPRAZOLE SODIUM 40 MG PO TBEC
40.0000 mg | DELAYED_RELEASE_TABLET | Freq: Every day | ORAL | Status: DC
  Administered 2020-07-13 – 2020-07-14 (×2): 40 mg via ORAL
  Filled 2020-07-13 (×2): qty 1

## 2020-07-13 MED ORDER — IOHEXOL 350 MG/ML SOLN
75.0000 mL | Freq: Once | INTRAVENOUS | Status: AC | PRN
  Administered 2020-07-13: 75 mL via INTRAVENOUS

## 2020-07-13 MED ORDER — STROKE: EARLY STAGES OF RECOVERY BOOK
Freq: Once | Status: AC
  Filled 2020-07-13: qty 1

## 2020-07-13 MED ORDER — MONTELUKAST SODIUM 10 MG PO TABS
10.0000 mg | ORAL_TABLET | Freq: Every day | ORAL | Status: DC
  Administered 2020-07-13: 10 mg via ORAL
  Filled 2020-07-13: qty 1

## 2020-07-13 MED ORDER — ACETAMINOPHEN 160 MG/5ML PO SOLN: 650.0000 mg | ORAL | Status: DC | PRN

## 2020-07-13 MED ORDER — MAGNESIUM OXIDE 400 (241.3 MG) MG PO TABS
1600.0000 mg | ORAL_TABLET | Freq: Every day | ORAL | Status: DC
  Administered 2020-07-13 – 2020-07-14 (×2): 1600 mg via ORAL
  Filled 2020-07-13 (×2): qty 4

## 2020-07-13 MED ORDER — VITAMIN D 25 MCG (1000 UNIT) PO TABS
1000.0000 [IU] | ORAL_TABLET | Freq: Every day | ORAL | Status: DC
  Administered 2020-07-13 – 2020-07-14 (×2): 1000 [IU] via ORAL
  Filled 2020-07-13 (×2): qty 1

## 2020-07-13 MED ORDER — ALLOPURINOL 300 MG PO TABS
150.0000 mg | ORAL_TABLET | Freq: Every day | ORAL | Status: DC
Start: 2020-07-13 — End: 2020-07-14
  Administered 2020-07-13 – 2020-07-14 (×2): 150 mg via ORAL
  Filled 2020-07-13: qty 1
  Filled 2020-07-13: qty 0.5

## 2020-07-13 NOTE — H&P (Signed)
History and Physical    Valerie Hobbs [id number] DOB: [birth year] DOA: 07/13/2020  PCP: Unk Pinto, MD (Confirm with patient/family/NH records and if not entered, this has to be entered at [facility] point of entry) Patient coming from: Home  I have personally briefly reviewed patient's old medical records in [place]  Chief Complaint: Right sided numbness  HPI: Valerie Hobbs is a 60 y.o. female with medical history significant of menorrhagia in the morning.  Symptoms on hormone replacement therapy HTN, asthma, gout, presented with new onset of right-sided numbness.  Her symptoms started 2 days ago, woke up in the morning and found she developed whole right-sided numbness involving the right side of the face, right side of the neck right arm and right leg and right torso, there was no weakness and she did not have any trouble walk or function otherwise so she did not seek any medical attention.  Yesterday she woke up and the numbness on the right side persist and treatment [facility] to seek medical advice and send to [facility] for CVA workup.  ED Course: CT head negative MRI showed acute left thalamus stroke  Review of Systems: As per HPI otherwise 14 point review of systems negative.    Past Medical History:  Diagnosis Date  . Allergy   . Anemia   . Anxiety   . Asthma   . Hypertension     History reviewed. No pertinent surgical history.   reports that she has never smoked. She has never used smokeless tobacco. She reports current alcohol use of about 14.0 standard drinks of alcohol per week. She reports current drug use. Drug: Marijuana.  Allergies  Allergen Reactions  . Ciprofloxacin Nausea Only    Headache  . Penicillins     Allergy has been more than 82yrs but unsure of what reaction was    Family History  Problem Relation Age of Onset  . Hypertension Mother   . Diabetes Mother   . Thyroid disease Mother   . Cataracts Mother   .  Cirrhosis Mother        fatty liver  . Diabetes Father   . Hypertension Father   . Heart disease Father        bypass  . Dementia Father        LEWY BODY    Prior to Admission medications   Medication Sig Start Date End Date Taking? Authorizing Provider  acyclovir (ZOVIRAX) 400 MG tablet TAKE 1 TABLET 2 X /DAY FOR FEVER BLISTERS Patient taking differently: Take 400 mg by mouth 2 (two) times daily as needed (For fever blisters). 05/29/20  Yes Unk Pinto, MD  allopurinol (ZYLOPRIM) 300 MG tablet TAKE 1/2 TO 1 TAB BY MOUTH EVERY DAY TO PREVENT GOUT Patient taking differently: Take 150 mg by mouth daily. 02/22/20  Yes Liane Comber, NP  Ascorbic Acid (VITAMIN C PO) Take 1 tablet by mouth daily.   Yes [provider]  atenolol (TENORMIN) 100 MG tablet TAKE 1 TABLET BY MOUTH  DAILY FOR BLOOD PRESSURE Patient taking differently: Take 100 mg by mouth daily. 02/17/20  Yes Liane Comber, NP  cholecalciferol (VITAMIN D) 1000 units tablet Take 1,000 Units by mouth daily.   Yes [provider]  furosemide (LASIX) 40 MG tablet TAKE 1 TABLET BY MOUTH  DAILY FOR BLOOD PRESSURE  AND FLUID RETENTION / ANKLE SWELLING Patient taking differently: Take 40 mg by mouth daily. 12/03/19  Yes Vladimir Crofts, PA-C  MAGNESIUM PO Take 1,600  mg by mouth daily.   Yes [provider]  milk thistle 175 MG tablet Take 175 mg by mouth daily.   Yes [provider]  montelukast (SINGULAIR) 10 MG tablet Take    1 tablet    Daily      for Allergies Patient taking differently: Take 10 mg by mouth at bedtime. 05/31/20  Yes Unk Pinto, MD  Omega-3 Fatty Acids (FISH OIL) 1000 MG CAPS Take 1,000 mg by mouth daily.   Yes [provider]  omeprazole (PRILOSEC) 20 MG capsule Take 10 mg by mouth daily.   Yes [provider]  [id number] 0.15-30 MG-MCG tablet TAKE 1 TABLET BY MOUTH  DAILY Patient taking differently: Take 1 tablet by mouth daily. 07/25/19  Yes Vicie Mutters R, PA-C  telmisartan (MICARDIS) 80 MG tablet TAKE 1 TABLET DAILY FOR  BLOOD PRESSURE Patient taking differently: Take 80 mg by mouth daily. 11/22/19  Yes Vicie Mutters R, PA-C  traZODone (DESYREL) 100 MG tablet TAKE 1/2 TO 1 TABLET BY MOUTH 1 HOUR BEFORE BEDTIME IF NEEDED FOR SLEEP Patient taking differently: Take 100 mg by mouth at bedtime as needed for sleep. 06/21/20  Yes Liane Comber, NP  clobetasol (TEMOVATE) 0.05 % external solution Apply 1 application topically 2 (two) times daily. 02/10/20   Sheffield, Ronalee Red, PA-C  Insulin Pen Needle (NOVOFINE) 32G X 6 MM MISC Needs daily for saxenda Patient not taking: No sig reported 06/06/19   Vladimir Crofts, [place]    Physical Exam: Vitals:   07/13/20 0553 07/13/20 0730 07/13/20 1041  BP: (!) 164/89 (!) 157/95 (!) 155/87  Pulse: 83 71 70  Resp: 16 18 18   Temp: 98 F (36.7 C) 98.4 F (36.9 C)   TempSrc: Oral Oral   SpO2: 98% 98% 98%    Constitutional: NAD, calm, comfortable Vitals:   07/13/20 0553 07/13/20 0730 07/13/20 1041  BP: (!) 164/89 (!) 157/95 (!) 155/87  Pulse: 83 71 70  Resp: 16 18 18   Temp: 98 F (36.7 C) 98.4 F (36.9 C)   TempSrc: Oral Oral   SpO2: 98% 98% 98%   Eyes: PERRL, lids and conjunctivae normal ENMT: Mucous membranes are moist. Posterior pharynx clear of any exudate or lesions.Normal dentition.  Neck: normal, supple, no masses, no thyromegaly. No Bruits. Respiratory: clear to auscultation bilaterally, no wheezing, no crackles. Normal respiratory effort. No accessory muscle use.  Cardiovascular: Regular rate and rhythm, no murmurs / rubs / gallops. No extremity edema. 2+ pedal pulses. No carotid bruits.  Abdomen: no tenderness, no masses palpated. No hepatosplenomegaly. Bowel sounds positive.  Musculoskeletal: no clubbing / cyanosis. No joint deformity upper and lower extremities. Good ROM, no contractures. Normal muscle tone.  Skin: no rashes, lesions, ulcers. No induration Neurologic: CN 2-12  grossly intact.  Decreased light touch sensation right side including left face neck right arm and leg and right-sided torso, DTR normal. Strength 5/5 in all 4.  Psychiatric: Normal judgment and insight. Alert and oriented x 3. Normal mood.     Labs on Admission: I have personally reviewed following labs and imaging studies  CBC: Recent Labs  Lab 07/12/20 1127 07/13/20 0819  WBC 10.4 7.5  NEUTROABS 6.8 4.1  HGB 13.1 13.1  HCT 38.2 38.4  MCV 101.9* 102.4*  PLT 279 [id number]   Basic Metabolic Panel: Recent Labs  Lab 07/12/20 1127 07/13/20 0819  NA 138 141  K 3.2* 3.9  CL 104 107  CO2 23 23  GLUCOSE 101* 97  BUN 11 11  CREATININE 0.81 0.84  CALCIUM 8.8* 9.1  MG  --  2.1   GFR: Estimated Creatinine Clearance: 66.4 mL/min (by C-G formula based on SCr of 0.84 mg/dL). Liver Function Tests: Recent Labs  Lab 07/12/20 1127  AST 33  ALT 20  ALKPHOS 43  BILITOT 0.3  PROT 7.1  ALBUMIN 4.0   No results for input(s): LIPASE, AMYLASE in the last 168 hours. No results for input(s): AMMONIA in the last 168 hours. Coagulation Profile: Recent Labs  Lab 07/12/20 1127  INR 1.0   Cardiac Enzymes: No results for input(s): CKTOTAL, CKMB, CKMBINDEX, TROPONINI in the last 168 hours. BNP (last 3 results) No results for input(s): PROBNP in the last 8760 hours. HbA1C: No results for input(s): HGBA1C in the last 72 hours. CBG: No results for input(s): GLUCAP in the last 168 hours. Lipid Profile: No results for input(s): CHOL, HDL, LDLCALC, TRIG, CHOLHDL, LDLDIRECT in the last 72 hours. Thyroid Function Tests: No results for input(s): TSH, T4TOTAL, FREET4, T3FREE, THYROIDAB in the last 72 hours. Anemia Panel: No results for input(s): VITAMINB12, FOLATE, FERRITIN, TIBC, IRON, RETICCTPCT in the last 72 hours. Urine analysis:    Component Value Date/Time   COLORURINE YELLOW 07/12/2020 [place] 07/12/2020 1204   LABSPEC 1.015 07/12/2020 1204   PHURINE 7.0 07/12/2020  1204   GLUCOSEU NEGATIVE 07/12/2020 1204   HGBUR MODERATE (A) 07/12/2020 1204   BILIRUBINUR NEGATIVE 07/12/2020 1204   KETONESUR NEGATIVE 07/12/2020 1204   PROTEINUR NEGATIVE 07/12/2020 1204   UROBILINOGEN 0.2 01/11/2015 1213   NITRITE NEGATIVE 07/12/2020 1204   LEUKOCYTESUR NEGATIVE 07/12/2020 1204    Radiological Exams on Admission: CT HEAD WO CONTRAST  Result Date: 07/12/2020 CLINICAL DATA:  Right-sided numbness EXAM: CT HEAD WITHOUT CONTRAST TECHNIQUE: Contiguous axial images were obtained from the base of the skull through the vertex without intravenous contrast. COMPARISON:  None. FINDINGS: Brain: No evidence of acute infarction, hemorrhage, hydrocephalus, extra-axial collection or mass lesion/mass effect. Cavum septum pellucidum. Scattered low-density changes within the periventricular and subcortical white matter compatible with chronic microvascular ischemic change. Mild diffuse cerebral volume loss. Vascular: No hyperdense vessel or unexpected calcification. Skull: Normal. Negative for fracture or focal lesion. Sinuses/Orbits: No acute finding. Other: None. IMPRESSION: 1. No acute intracranial findings. 2. Chronic microvascular ischemic change and cerebral volume loss. Electronically Signed   By: Davina Poke D.O.   On: 07/12/2020 11:24   MR BRAIN WO CONTRAST  Result Date: 07/13/2020 CLINICAL DATA:  Right-sided numbness. EXAM: MRI HEAD WITHOUT CONTRAST TECHNIQUE: Multiplanar, multiecho pulse sequences of the brain and surrounding structures were obtained without intravenous contrast. COMPARISON:  Head CT 07/12/2020 FINDINGS: Brain: There is a 1 cm acute infarct laterally in the left thalamus. No intracranial hemorrhage, mass, midline shift, or extra-axial fluid collection is identified. T2 hyperintensity scattered throughout the cerebral white matter bilaterally are nonspecific but compatible with moderately age advanced chronic small vessel ischemic disease. There is mild cerebral  atrophy. A cavum septum pellucidum et vergae is incidentally noted. Vascular: Major intracranial vascular flow voids are preserved. Skull and upper cervical spine: Unremarkable bone marrow signal. Grade 1 anterolisthesis of C3 on C4 and C4 on C5. Sinuses/Orbits: Unremarkable orbits. Minimal mucosal thickening in the ethmoid sinuses. Clear mastoid air cells. Other: None. IMPRESSION: 1. Acute left thalamic infarct. 2. Moderate chronic small vessel ischemic disease. Electronically Signed   By: Logan Bores M.D.   On: 07/13/2020 10:58    EKG: Independently reviewed. Sinus.  Assessment/Plan  Active Problems:   CVA (cerebral vascular accident) ([place])  (please populate well all problems here in Problem List. (For example, if patient is on BP meds at home and you resume or decide to hold them, it is a problem that needs to be . Same for CAD, COPD, HLD and so on)  Acute left thalamus CVA -With right-sided pareesthesia -CTA head and neck -A1c and lipid panel -Hold home BP meds, permissive hypertension for 24 hours.  As needed hydralazine -Risk factor modification: D/W patient regarding estrogen hormone replacement therapy put her at a greater risk for CVA and MI compared to normal population, patient claims that she has been on the same regimen, matriculation since she was 60 year old for irregular menstrual period and menorrhagia by her PCP. Given that pt >18 y/o and there is no significant benefits with systemic hormone replacement, recommend local administration of placement if she ever has viginal symptoms.  HTN -As above  Gout -Continue home regimen.   DVT prophylaxis: Lovenox  code Status: Full code Family Communication: None at bedside Disposition Plan: Expect less than 24 hospital stay, home PT versus outpatient PT Consults called: Neurology Admission status: Telemetry observation   Lequita Halt MD [facility] Pager [phone]  07/13/2020, 1:24 PM

## 2020-07-13 NOTE — Evaluation (Signed)
Physical Therapy Evaluation and Discharge Patient Details Name: Valerie Hobbs MRN: [id number] DOB: [birth year] Today's Date: 07/13/2020   History of Present Illness  Pt is a 60 y/o female admitted secondary to R extremity numbness and tingling. MRI revealed L thalamic infarct. PMH includes asthma, HTN, and gout.  Clinical Impression  Patient evaluated by Physical Therapy with no further acute PT needs identified. All education has been completed and the patient has no further questions. Pt presenting with RLE tingling, however, pt reporting it as mild and did not affect mobility. Was independent with transfers and gait and mod I with steps. Scored 23 on DGI indicating low fall risk. Educated about BE FAST acronym in recognizing CVA symptoms.  See below for any follow-up Physical Therapy or equipment needs. PT is signing off. Thank you for this referral. If needs change, please re-consult.     Follow Up Recommendations No PT follow up    Equipment Recommendations  None recommended by PT    Recommendations for Other Services       Precautions / Restrictions Precautions Precautions: Fall Restrictions Weight Bearing Restrictions: No      Mobility  Bed Mobility Overal bed mobility: Independent                  Transfers Overall transfer level: Independent                  Ambulation/Gait Ambulation/Gait assistance: Independent Gait Distance (Feet): 150 Feet Assistive device: None Gait Pattern/deviations: WFL(Within Functional Limits) Gait velocity: WFL   General Gait Details: Able to perform DGI tasks without LOB. Good gait speed.  Stairs Stairs: Yes Stairs assistance: Modified independent (Device/Increase time) Stair Management: One rail Right;Alternating pattern;Forwards Number of Stairs: 4 General stair comments: Overall steady stair navigation using rail. No LOB noted.  Wheelchair Mobility    Modified Rankin (Stroke Patients Only) Modified  Rankin (Stroke Patients Only) Pre-Morbid Rankin Score: No symptoms Modified Rankin: No symptoms     Balance Overall balance assessment: Independent                               Standardized Balance Assessment Standardized Balance Assessment : Dynamic Gait Index   Dynamic Gait Index Level Surface: Normal Change in Gait Speed: Normal Gait with Horizontal Head Turns: Normal Gait with Vertical Head Turns: Normal Gait and Pivot Turn: Normal Step Over Obstacle: Normal Step Around Obstacles: Normal Steps: Mild Impairment Total Score: 23       Pertinent Vitals/Pain Pain Assessment: No/denies pain    Home Living Family/patient expects to be discharged to:: Private residence Living Arrangements: Spouse/significant other Available Help at Discharge: Family Type of Home: House Home Access: Stairs to enter Entrance Stairs-Rails: Left Entrance Stairs-Number of Steps: 10 Home Layout: One level Home Equipment: None      Prior Function Level of Independence: Independent               Hand Dominance        Extremity/Trunk Assessment   Upper Extremity Assessment Upper Extremity Assessment: Defer to OT evaluation    Lower Extremity Assessment Lower Extremity Assessment: RLE deficits/detail RLE Deficits / Details: Reports mild tingling in RLE RLE Sensation: decreased light touch    Cervical / Trunk Assessment Cervical / Trunk Assessment: Normal  Communication   Communication: No difficulties  Cognition Arousal/Alertness: Awake/alert Behavior During Therapy: WFL for tasks assessed/performed Overall Cognitive Status: Within Functional Limits for tasks assessed  General Comments General comments (skin integrity, edema, etc.): Educated about "BE FAST" acronym when recognizing CVA symptoms.    Exercises     Assessment/Plan    PT Assessment Patent does not need any further PT services  PT  Problem List         PT Treatment Interventions      PT Goals (Current goals can be found in the Care Plan section)  Acute Rehab PT Goals Patient Stated Goal: to go home PT Goal Formulation: All assessment and education complete, DC therapy Time For Goal Achievement: 07/13/20 Potential to Achieve Goals: Good    Frequency     Barriers to discharge        Co-evaluation               AM-PAC PT "6 Clicks" Mobility  Outcome Measure Help needed turning from your back to your side while in a flat bed without using bedrails?: None Help needed moving from lying on your back to sitting on the side of a flat bed without using bedrails?: None Help needed moving to and from a bed to a chair (including a wheelchair)?: None Help needed standing up from a chair using your arms (e.g., wheelchair or bedside chair)?: None Help needed to walk in hospital room?: None Help needed climbing 3-5 steps with a railing? : None 6 Click Score: 24    End of Session Equipment Utilized During Treatment: Gait belt Activity Tolerance: Patient tolerated treatment well Patient left: in bed;with call bell/phone within reach Nurse Communication: Mobility status PT Visit Diagnosis: Other symptoms and signs involving the nervous system (R29.898)    Time: 4782-9562 PT Time Calculation (min) (ACUTE ONLY): 13 min   Charges:   PT Evaluation $PT Eval Low Complexity: 1 Low          Valerie Hobbs, DPT  Acute Rehabilitation Services  Pager: [phone] Office: [phone]   Rudean Hitt 07/13/2020, 5:13 PM

## 2020-07-13 NOTE — Consult Note (Signed)
Neurology Consultation  Reason for Consult: Right-sided numbness Referring Physician: Dr. Ronnald Nian  CC: Numbness  History is obtained from:Patient, chart  HPI: Valerie Hobbs is a 60 y.o. female past medical history of hypertension, anxiety, asthma, presented to the emergency room for evaluation of sudden onset of right-sided numbness She had presented to [place] yesterday for evaluation of numbness that started when she woke up Wednesday- 07/11/2020 in the morning.  She was seen at the emergency room at [facility] and recommended she come to [facility] for an MRI but she refused transportation for MRI.  She went back home and symptoms did not improve, hence she came to the emergency room at [facility]. Her last known well was when she went to bed Tuesday, 05/10/2021, and woke up with the symptoms. The symptoms she describes as somebody has given Novocain on the right side of the body including face arm and leg.  She says her face feels swollen on the right side.  She denies any headaches.  Denies any tingling.  Denies any numbness elsewhere in the body.  Denies any visual changes. No prior history of strokes No family history of strokes or heart attacks in young people. Mother had a heart attack at an older age Non-smoker.  No drug abuse.   LKW: At least 3 days ago-Tuesday, 07/10/2020 tpa given?: no, outside the window Premorbid modified Rankin scale (mRS): 0  ROS: Performed and negative except as noted in HPI  Past Medical History:  Diagnosis Date  . Allergy   . Anemia   . Anxiety   . Asthma   . Hypertension    Family History  Problem Relation Age of Onset  . Hypertension Mother   . Diabetes Mother   . Thyroid disease Mother   . Cataracts Mother   . Cirrhosis Mother        fatty liver  . Diabetes Father   . Hypertension Father   . Heart disease Father        bypass  . Dementia Father        LEWY BODY   Social History:    reports that she has never smoked. She has never used smokeless tobacco. She reports current alcohol use of about 14.0 standard drinks of alcohol per week. She reports current drug use. Drug: Marijuana.  Medications  Current Facility-Administered Medications:  .  dexamethasone (DECADRON) injection 10 mg, 10 mg, Intramuscular, Once, McClanahan, Kyra, NP  Current Outpatient Medications:  .  acyclovir (ZOVIRAX) 400 MG tablet, TAKE 1 TABLET 2 X /DAY FOR FEVER BLISTERS (Patient taking differently: Take 400 mg by mouth 2 (two) times daily as needed (For fever blisters).), Disp: 180 tablet, Rfl: 3 .  allopurinol (ZYLOPRIM) 300 MG tablet, TAKE 1/2 TO 1 TAB BY MOUTH EVERY DAY TO PREVENT GOUT (Patient taking differently: Take 150 mg by mouth daily.), Disp: 30 tablet, Rfl: 8 .  Ascorbic Acid (VITAMIN C PO), Take 1 tablet by mouth daily., Disp: , Rfl:  .  atenolol (TENORMIN) 100 MG tablet, TAKE 1 TABLET BY MOUTH  DAILY FOR BLOOD PRESSURE (Patient taking differently: Take 100 mg by mouth daily.), Disp: 90 tablet, Rfl: 3 .  cholecalciferol (VITAMIN D) 1000 units tablet, Take 1,000 Units by mouth daily., Disp: , Rfl:  .  furosemide (LASIX) 40 MG tablet, TAKE 1 TABLET BY MOUTH  DAILY FOR BLOOD PRESSURE  AND FLUID RETENTION / ANKLE SWELLING (Patient taking differently: Take 40 mg by mouth  daily.), Disp: 90 tablet, Rfl: 3 .  MAGNESIUM PO, Take 1,600 mg by mouth daily., Disp: , Rfl:  .  milk thistle 175 MG tablet, Take 175 mg by mouth daily., Disp: , Rfl:  .  montelukast (SINGULAIR) 10 MG tablet, Take    1 tablet    Daily      for Allergies (Patient taking differently: Take 10 mg by mouth at bedtime.), Disp: 90 tablet, Rfl: 0 .  Omega-3 Fatty Acids (FISH OIL) 1000 MG CAPS, Take 1,000 mg by mouth daily., Disp: , Rfl:  .  omeprazole (PRILOSEC) 20 MG capsule, Take 10 mg by mouth daily., Disp: , Rfl:  .  PORTIA-28 0.15-30 MG-MCG tablet, TAKE 1 TABLET BY MOUTH  DAILY (Patient taking differently: Take 1 tablet by mouth  daily.), Disp: 84 tablet, Rfl: 3 .  telmisartan (MICARDIS) 80 MG tablet, TAKE 1 TABLET DAILY FOR  BLOOD PRESSURE (Patient taking differently: Take 80 mg by mouth daily.), Disp: 90 tablet, Rfl: 3 .  traZODone (DESYREL) 100 MG tablet, TAKE 1/2 TO 1 TABLET BY MOUTH 1 HOUR BEFORE BEDTIME IF NEEDED FOR SLEEP (Patient taking differently: Take 100 mg by mouth at bedtime as needed for sleep.), Disp: 90 tablet, Rfl: 0 .  clobetasol (TEMOVATE) 0.05 % external solution, Apply 1 application topically 2 (two) times daily., Disp: 50 mL, Rfl: 3 .  Insulin Pen Needle (NOVOFINE) 32G X 6 MM MISC, Needs daily for saxenda (Patient not taking: No sig reported), Disp: 90 each, Rfl: 2   Exam: Current vital signs: BP (!) 155/87   Pulse 70   Temp 98.4 F (36.9 C) (Oral)   Resp 18   SpO2 98%  Vital signs in last 24 hours: Temp:  [98 F (36.7 C)-98.4 F (36.9 C)] 98.4 F (36.9 C) (01/14 0730) Pulse Rate:  [70-83] 70 (01/14 1041) Resp:  [16-18] 18 (01/14 1041) BP: (155-168)/(87-95) 155/87 (01/14 1041) SpO2:  [97 %-98 %] 98 % (01/14 1041) GENERAL: Awake, alert in NAD HEENT: - Normocephalic and atraumatic, dry mm, no LN++, no Thyromegally LUNGS - Clear to auscultation bilaterally with no wheezes CV - S1S2 RRR, no m/r/g, equal pulses bilaterally. ABDOMEN - Soft, nontender, nondistended with normoactive BS Ext: warm, well perfused, intact peripheral pulses, no edema  NEURO:  Mental Status: AA&Ox3  Language: speech is nondysarthric.  Naming, repetition, fluency, and comprehension intact. Cranial Nerves: PERRL. EOMI, visual fields full, no facial asymmetry, facial sensation intact, hearing intact, tongue/uvula/soft palate midline, normal sternocleidomastoid and trapezius muscle strength. No evidence of tongue atrophy or fibrillations Motor: Antigravity 5/5 in all fours-no vertical drift. Tone: is normal and bulk is normal Sensation-diminished on the right hemibody Coordination: FTN intact bilaterally, no ataxia  in BLE. Gait- deferred  NIHSS-1 for sensory.   Labs I have reviewed labs in epic and the results pertinent to this consultation are:   CBC    Component Value Date/Time   WBC 7.5 07/13/2020 0819   RBC 3.75 (L) 07/13/2020 0819   HGB 13.1 07/13/2020 0819   HCT 38.4 07/13/2020 0819   PLT 287 07/13/2020 0819   MCV 102.4 (H) 07/13/2020 0819   MCH 34.9 (H) 07/13/2020 0819   MCHC 34.1 07/13/2020 0819   RDW 13.7 07/13/2020 0819   LYMPHSABS 2.0 07/13/2020 0819   MONOABS 0.8 07/13/2020 0819   EOSABS 0.4 07/13/2020 0819   BASOSABS 0.1 07/13/2020 0819    CMP     Component Value Date/Time   NA 141 07/13/2020 0819   K 3.9 07/13/2020  0819   CL 107 07/13/2020 0819   CO2 23 07/13/2020 0819   GLUCOSE 97 07/13/2020 0819   BUN 11 07/13/2020 0819   CREATININE 0.84 07/13/2020 0819   CREATININE 0.74 06/03/2019 1024   CALCIUM 9.1 07/13/2020 0819   PROT 7.1 07/12/2020 1127   ALBUMIN 4.0 07/12/2020 1127   AST 33 07/12/2020 1127   ALT 20 07/12/2020 1127   ALKPHOS 43 07/12/2020 1127   BILITOT 0.3 07/12/2020 1127   GFRNONAA >60 07/13/2020 0819   GFRNONAA 89 06/03/2019 1024   GFRAA 103 06/03/2019 1024    Lipid Panel     Component Value Date/Time   CHOL 195 06/03/2019 1024   TRIG 153 (H) 06/03/2019 1024   HDL 66 06/03/2019 1024   CHOLHDL 3.0 06/03/2019 1024   VLDL 21 01/14/2017 0943   LDLCALC 104 (H) 06/03/2019 1024     Imaging I have reviewed the images obtained:  CT-scan of the brain-no acute changes  MRI examination of the brain acute left thalamic infarct.   Assessment:  60 year old with sudden onset of right hemibody numbness that started upon waking up on Wednesday morning.  Last known well Tuesday, 07/10/2020 MRI of the brain consistent with a acute left thalamic infarct-etiology likely small vessel disease. Needs admission for stroke risk factor work-up.  Impression: Acute ischemic stroke- left thalamic lacunar infarct- likely small vessel  etiology  Recommendations: -Admit to observation -Telemetry monitoring -Allow for permissive hypertension for the first 24-48h - only treat PRN if SBP >220 mmHg. Blood pressures can be gradually normalized to SBP<140 upon discharge. -CT Angiogram of Head and neck -Echocardiogram -HgbA1c, fasting lipid panel -Frequent neuro checks -Prophylactic therapy-Antiplatelet med: Dual antiplatelets-aspirin 81 and Plavix 75.  Continue dual antiplatelets for 3 weeks and then aspirin only.  If the CT angiogram head and neck shows intracranial atherosclerosis, might need to continue dual antiplatelet for 3 months.  Final recommendations after stroke team rounding. -Atorvastatin 80 mg PO daily -Risk factor modification -PT consult, OT consult, Speech consult -N.p.o. until cleared by bedside swallow evaluation/formal swallow evaluation. Plan preliminarily discussed with Franchot Heidelberg, PA-c EDP  Please page stroke NP/PA/MD (listed on AMION)  from 8am-4 pm as this patient will be followed by the stroke team at this point.  -- Amie Portland, MD Neurologist [facility] Pager: [phone]

## 2020-07-13 NOTE — ED Triage Notes (Signed)
Pt c/o R sided numbness that began Wednesday morning after having moved something heavy Tuesday evening. Was seen at [facility] yesterday for same, CT clear but was told to come for MRI. Pt ambulatory w steady gait, no neuro deficits

## 2020-07-13 NOTE — ED Notes (Signed)
Patient back from MRI.

## 2020-07-13 NOTE — ED Provider Notes (Signed)
[facility] EMERGENCY DEPARTMENT Provider Note   CSN: [id number] Arrival date & time: 07/13/20  0547     History Chief Complaint  Patient presents with  . R sided numbness    Valerie Hobbs is a 60 y.o. female presenting for evaluation of right sided numbness, tingling.  Patient states 2 days ago she developed right sided numbness and tingling.  3 days ago she did have extra exertion, pushing something heavy.  She took ibuprofen meloxicam without improvement of her symptoms.  She was seen at [facility] ER yesterday, had a negative head CT and reassuring blood work.  Is recommended she be transferred for an MRI, however she declined and left without getting the MRI.  She reports her symptoms are persistent today, not improving or worsening over the past 2 days.  Nothing makes them better or worse.  The numbness and tingling is of her occipital head, right upper extremity, right torso, and right lower extremity.  Does not include the face.  No symptoms on the left side.  No previous history of similar.  She denies recent fevers, chills, headache, cough, chest pain, shortness of breath.  No new medications.  She has a history of hypertension for which she takes medication, she has not had it today.   Additional history obtained from chart review.  I reviewed patient's ER visit from yesterday including negative head CT as well as neurology consultation and recommendations.   HPI     Past Medical History:  Diagnosis Date  . Allergy   . Anemia   . Anxiety   . Asthma   . Hypertension     Patient Active Problem List   Diagnosis Date Noted  . Gout 12/12/2015  . Hyperlipidemia 12/12/2015  . Vitamin D deficiency 12/12/2015  . Hypokalemia 07/15/2013  . Hypertension   . Allergy   . Anemia   . Anxiety   . Asthma     History reviewed. No pertinent surgical history.   OB History   No obstetric history on file.     Family History  Problem  Relation Age of Onset  . Hypertension Mother   . Diabetes Mother   . Thyroid disease Mother   . Cataracts Mother   . Cirrhosis Mother        fatty liver  . Diabetes Father   . Hypertension Father   . Heart disease Father        bypass  . Dementia Father        LEWY BODY    Social History   Tobacco Use  . Smoking status: Never Smoker  . Smokeless tobacco: Never Used  Vaping Use  . Vaping Use: Never used  Substance Use Topics  . Alcohol use: Yes    Alcohol/week: 14.0 standard drinks    Types: 14 Standard drinks or equivalent per week    Comment: daily  . Drug use: Yes    Types: Marijuana    Home Medications Prior to Admission medications   Medication Sig Start Date End Date Taking? Authorizing Provider  acyclovir (ZOVIRAX) 400 MG tablet TAKE 1 TABLET 2 X /DAY FOR FEVER BLISTERS 05/29/20   Unk Pinto, MD  allopurinol (ZYLOPRIM) 300 MG tablet TAKE 1/2 TO 1 TAB BY MOUTH EVERY DAY TO PREVENT GOUT 02/22/20   Liane Comber, NP  ALPRAZolam Duanne Moron) 0.5 MG tablet Take 1 tablet (0.5 mg total) by mouth daily as needed for anxiety. Patient not taking: Reported on 02/10/2020 08/27/18  Vladimir Crofts, PA-C  Ascorbic Acid (VITAMIN C PO) Take by mouth daily.    [provider]  atenolol (TENORMIN) 100 MG tablet TAKE 1 TABLET BY MOUTH  DAILY FOR BLOOD PRESSURE 02/17/20   Liane Comber, NP  cholecalciferol (VITAMIN D) 1000 units tablet Take 1,000 Units by mouth daily.    [provider]  clobetasol (TEMOVATE) 0.05 % external solution Apply 1 application topically 2 (two) times daily. 02/10/20   Sheffield, Ronalee Red, PA-C  furosemide (LASIX) 40 MG tablet TAKE 1 TABLET BY MOUTH  DAILY FOR BLOOD PRESSURE  AND FLUID RETENTION / ANKLE SWELLING 12/03/19   Vicie Mutters R, PA-C  gabapentin (NEURONTIN) 100 MG capsule Take 1 capsule (100 mg total) by mouth 3 (three) times daily. 06/20/19 06/19/20  Garnet Sierras, NP  Insulin Pen Needle (NOVOFINE) 32G X 6 MM MISC Needs daily  for saxenda Patient not taking: Reported on 02/10/2020 06/06/19   Vladimir Crofts, PA-C  MAGNESIUM PO Take 400 mg by mouth daily.     [provider]  milk thistle 175 MG tablet Take 175 mg by mouth daily.    [provider]  montelukast (SINGULAIR) 10 MG tablet Take    1 tablet    Daily      for Allergies 05/31/20   Unk Pinto, MD  Omega-3 Fatty Acids (FISH OIL) 1000 MG CAPS Take 1,000 mg by mouth daily.    [provider]  omeprazole (PRILOSEC) 20 MG capsule Take 20 mg by mouth daily.     [provider]  phentermine (ADIPEX-P) 37.5 MG tablet Take 1 tablet (37.5 mg total) by mouth daily before breakfast. 10/26/19   Vladimir Crofts, [place]  PORTIA-28 0.15-30 MG-MCG tablet TAKE 1 TABLET BY MOUTH  DAILY 07/25/19   Vladimir Crofts, PA-C  telmisartan (MICARDIS) 80 MG tablet TAKE 1 TABLET DAILY FOR  BLOOD PRESSURE 11/22/19   Vladimir Crofts, PA-C  traZODone (DESYREL) 100 MG tablet TAKE 1/2 TO 1 TABLET BY MOUTH 1 HOUR BEFORE BEDTIME IF NEEDED FOR SLEEP 06/21/20   Liane Comber, NP    Allergies    Ciprofloxacin and Penicillins  Review of Systems   Review of Systems  Neurological: Positive for numbness. Negative for weakness and headaches.  All other systems reviewed and are negative.   Physical Exam Updated Vital Signs BP (!) 155/87   Pulse 70   Temp 98.4 F (36.9 C) (Oral)   Resp 18   SpO2 98%   Physical Exam Vitals and nursing note reviewed.  Constitutional:      General: She is not in acute distress.    Appearance: She is well-developed and well-nourished.     Comments: Resting in the bed in no acute distress  HENT:     Head: Normocephalic and atraumatic.  Eyes:     Extraocular Movements: Extraocular movements intact and EOM normal.     Conjunctiva/sclera: Conjunctivae normal.     Pupils: Pupils are equal, round, and reactive to light.  Cardiovascular:     Rate and Rhythm: Normal rate and regular rhythm.     Pulses: Normal pulses  and intact distal pulses.  Pulmonary:     Effort: Pulmonary effort is normal. No respiratory distress.     Breath sounds: Normal breath sounds. No wheezing.  Abdominal:     General: There is no distension.     Palpations: Abdomen is soft. There is no mass.     Tenderness: There is no abdominal tenderness. There  is no guarding or rebound.  Musculoskeletal:        General: Normal range of motion.     Cervical back: Normal range of motion and neck supple.     Comments: Radial and pedal pulses 2+ bilaterally.  No obvious deformity or swelling.  Skin:    General: Skin is warm and dry.     Capillary Refill: Capillary refill takes less than 2 seconds.  Neurological:     Mental Status: She is alert and oriented to person, place, and time.     GCS: GCS eye subscore is 4. GCS verbal subscore is 5. GCS motor subscore is 6.     Cranial Nerves: Cranial nerves are intact.     Sensory: Sensory deficit present.     Motor: Motor function is intact.     Coordination: Coordination is intact.     Comments: No clear neuro deficit other than sensation. Pt reprots decreased sensation of R arm and leg, however not complete loss of sensation. No sensation deficit of the face.  CN intact.  Nose to finger intact.  Fineman and coronation intact.  Strength and sensation intact x4. reflexes equal bilaterally.   Psychiatric:        Mood and Affect: Mood and affect normal.     ED Results / Procedures / Treatments   Labs (all labs ordered are listed, but only abnormal results are displayed) Labs Reviewed  CBC WITH DIFFERENTIAL/PLATELET - Abnormal; Notable for the following components:      Result Value   RBC 3.75 (*)    MCV 102.4 (*)    MCH 34.9 (*)    All other components within normal limits  SARS CORONAVIRUS 2 (TAT 6-24 HRS)  BASIC METABOLIC PANEL  MAGNESIUM    EKG None  Radiology CT HEAD WO CONTRAST  Result Date: 07/12/2020 CLINICAL DATA:  Right-sided numbness EXAM: CT HEAD WITHOUT CONTRAST  TECHNIQUE: Contiguous axial images were obtained from the base of the skull through the vertex without intravenous contrast. COMPARISON:  None. FINDINGS: Brain: No evidence of acute infarction, hemorrhage, hydrocephalus, extra-axial collection or mass lesion/mass effect. Cavum septum pellucidum. Scattered low-density changes within the periventricular and subcortical white matter compatible with chronic microvascular ischemic change. Mild diffuse cerebral volume loss. Vascular: No hyperdense vessel or unexpected calcification. Skull: Normal. Negative for fracture or focal lesion. Sinuses/Orbits: No acute finding. Other: None. IMPRESSION: 1. No acute intracranial findings. 2. Chronic microvascular ischemic change and cerebral volume loss. Electronically Signed   By: Davina Poke D.O.   On: 07/12/2020 11:24   MR BRAIN WO CONTRAST  Result Date: 07/13/2020 CLINICAL DATA:  Right-sided numbness. EXAM: MRI HEAD WITHOUT CONTRAST TECHNIQUE: Multiplanar, multiecho pulse sequences of the brain and surrounding structures were obtained without intravenous contrast. COMPARISON:  Head CT 07/12/2020 FINDINGS: Brain: There is a 1 cm acute infarct laterally in the left thalamus. No intracranial hemorrhage, mass, midline shift, or extra-axial fluid collection is identified. T2 hyperintensity scattered throughout the cerebral white matter bilaterally are nonspecific but compatible with moderately age advanced chronic small vessel ischemic disease. There is mild cerebral atrophy. A cavum septum pellucidum et vergae is incidentally noted. Vascular: Major intracranial vascular flow voids are preserved. Skull and upper cervical spine: Unremarkable bone marrow signal. Grade 1 anterolisthesis of C3 on C4 and C4 on C5. Sinuses/Orbits: Unremarkable orbits. Minimal mucosal thickening in the ethmoid sinuses. Clear mastoid air cells. Other: None. IMPRESSION: 1. Acute left thalamic infarct. 2. Moderate chronic small vessel ischemic  disease. Electronically Signed  By: Logan Bores M.D.   On: 07/13/2020 10:58    Procedures Procedures (including critical care time)  Medications Ordered in ED Medications - No data to display  ED Course  I have reviewed the triage vital signs and the nursing notes.  Pertinent labs & imaging results that were available during my care of the patient were reviewed by me and considered in my medical decision making (see chart for details).    MDM Rules/Calculators/A&P                          Patient presenting for evaluation of numbness and tingling of the right side of the body.  On exam, patient appears nontoxic.  She has no other neurodeficits besides decree sensation of the right side.  Per neurology recommendations yesterday, was recommended to get an MRI to rule out stroke.  She is out of any tPA or LVO window.  We will repeat basic blood work including magnesium, as patient had mild hypokalemia yesterday.  MRI ordered.  Labs interpreted by me, overall reassuring.  Potassium and magnesium normal today.  MRI pending.  MRI consistent with acute thalamic stroke.  Will consult with neurology.  Discussed with Dr. Rory Percy from neurology who will evaluate the patient, recommends admission to medicine.  Discussed with Dr. Roosevelt Locks from [facility] hospital[facility] service, pt to be admitted.   Final Clinical Impression(s) / ED Diagnoses Final diagnoses:  Thalamic stroke [facility])    Rx / DC Orders ED Discharge Orders    None       Franchot Heidelberg, PA-C 07/13/20 1210    Lennice Sites, DO 07/13/20 1254

## 2020-07-13 NOTE — ED Notes (Signed)
Pt able to move all extremities, neurovascular of extremities intact. Pt walked well to the bathroom.

## 2020-07-13 NOTE — ED Notes (Signed)
Patient transported to CT 

## 2020-07-13 NOTE — Procedures (Signed)
Came bedside for echo, but patient is working with PT at this time.

## 2020-07-13 NOTE — ED Notes (Signed)
Patient transported to MRI 

## 2020-07-14 ENCOUNTER — Observation Stay (HOSPITAL_BASED_OUTPATIENT_CLINIC_OR_DEPARTMENT_OTHER): Payer: 59

## 2020-07-14 ENCOUNTER — Encounter (HOSPITAL_COMMUNITY): Payer: Self-pay | Admitting: Internal Medicine

## 2020-07-14 DIAGNOSIS — I6389 Other cerebral infarction: Secondary | ICD-10-CM | POA: Diagnosis not present

## 2020-07-14 DIAGNOSIS — I639 Cerebral infarction, unspecified: Secondary | ICD-10-CM | POA: Diagnosis not present

## 2020-07-14 LAB — LIPID PANEL
Cholesterol: 154 mg/dL (ref 0–200)
HDL: 45 mg/dL (ref >40)
LDL Cholesterol: 76 mg/dL (ref 0–99)
Total CHOL/HDL Ratio: 3.4 RATIO
Triglycerides: 163 mg/dL — ABNORMAL HIGH (ref <150)
VLDL: 33 mg/dL (ref 0–40)

## 2020-07-14 LAB — ECHOCARDIOGRAM COMPLETE
Area-P 1/2: 2.73 cm2
Height: 62 in
S' Lateral: 3.2 cm
Weight: 2560 oz

## 2020-07-14 LAB — HEMOGLOBIN A1C
Hgb A1c MFr Bld: 5.3 % (ref 4.8–5.6)
Mean Plasma Glucose: 105.41 mg/dL

## 2020-07-14 MED ORDER — CLOPIDOGREL BISULFATE 75 MG PO TABS: 75.0000 mg | ORAL_TABLET | Freq: Every day | ORAL | 0 refills | Status: DC

## 2020-07-14 MED ORDER — ASPIRIN 81 MG PO TBEC: 81.0000 mg | DELAYED_RELEASE_TABLET | Freq: Every day | ORAL | 11 refills | Status: AC

## 2020-07-14 MED ORDER — ATORVASTATIN CALCIUM 40 MG PO TABS: 40.0000 mg | ORAL_TABLET | Freq: Every day | ORAL | 1 refills | Status: DC

## 2020-07-14 NOTE — Discharge Summary (Signed)
Physician Discharge Summary  Valerie Hobbs [id number] DOB: January 13, 1961 DOA: 07/13/2020  PCP: Unk Pinto, MD  Admit date: 07/13/2020 Discharge date: 07/14/2020  Admitted From: home Discharge disposition: home   Recommendations for Outpatient Follow-Up:   Smoking cessation Asa plus plavix x 3 weeks then ASA alone Statin started 2 mm right supraclinoid ICA aneurysm - outpt f/u  Discharge Diagnosis:   Active Problems:   Thalamic stroke [facility])    Discharge Condition: Improved.  Diet recommendation: Low sodium, heart healthy.  Wound care: None.  Code status: Full.   History of Present Illness:   Valerie Hobbs is a 60 y.o. female with medical history significant of menorrhagia in the morning.  Symptoms on hormone replacement therapy HTN, asthma, gout, presented with new onset of right-sided numbness.  Her symptoms started 2 days ago, woke up in the morning and found she developed whole right-sided numbness involving the right side of the face, right side of the neck right arm and right leg and right torso, there was no weakness and she did not have any trouble walk or function otherwise so she did not seek any medical attention.  Yesterday she woke up and the numbness on the right side persist and treatment [facility] to seek medical advice and send to [facility] for CVA workup.   Hospital Course by Problem:   Stroke: acute left thalamic infarct likely due to small vessel disease.   CT head - No acute intracranial findings. Chronic microvascular ischemic change and cerebral volume loss.   MRI head - Acute left thalamic infarct. Moderate chronic small vessel ischemic disease.   MRA head - not ordered  CTA H&N - No large vessel occlusion or significant stenosis in the head and neck. 2 mm right supraclinoid ICA aneurysm. Known acute left thalamic infarct.   Carotid Doppler - CTA neck ordered - carotid dopplers not indicated.  2D Echo  -normal ejection fraction without cardiac source of embolism.  LDL - 76  HgbA1c - 5.3 No antithrombotic prior to admission, now on aspirin 81 mg daily and clopidogrel 75 mg daily 3 weeks then ASA alone  Hypertension  Permissive hypertension (OK if < 220/120) but gradually normalize in 5-7 days   Long-term BP goal normotensive  Hyperlipidemia Home Lipid lowering medication: none  LDL 76, goal < 70 add Lipitor 40 mg daily       Medical Consultants:   neuro   Discharge Exam:   Vitals:   07/14/20 0823 07/14/20 1221  BP: (!) 155/76 (!) 176/89  Pulse: 73 68  Resp: 17 18  Temp: 98 F (36.7 C) 98.4 F (36.9 C)  SpO2: 97% 98%   Vitals:   07/14/20 0009 07/14/20 0420 07/14/20 0823 07/14/20 1221  BP: (!) 150/80 (!) 166/80 (!) 155/76 (!) 176/89  Pulse: 67 72 73 68  Resp: 19 16 17 18   Temp: 98.3 F (36.8 C) 98.6 F (37 C) 98 F (36.7 C) 98.4 F (36.9 C)  TempSrc: Oral Oral Oral Oral  SpO2: 97% 98% 97% 98%  Weight:      Height:        General exam: Appears calm and comfortable  The results of significant diagnostics from this hospitalization (including imaging, microbiology, ancillary and laboratory) are listed below for reference.     Procedures and Diagnostic Studies:   CT ANGIO HEAD W OR WO CONTRAST  Result Date: 07/13/2020 CLINICAL DATA:  Right-sided numbness. Acute left thalamic infarct on MRI. EXAM: CT ANGIOGRAPHY HEAD  AND NECK TECHNIQUE: Multidetector CT imaging of the head and neck was performed using the standard protocol during bolus administration of intravenous contrast. Multiplanar CT image reconstructions and MIPs were obtained to evaluate the vascular anatomy. Carotid stenosis measurements (when applicable) are obtained utilizing NASCET criteria, using the distal internal carotid diameter as the denominator. CONTRAST:  75mL OMNIPAQUE IOHEXOL 350 MG/ML SOLN COMPARISON:  Head MRI 07/13/2020 FINDINGS: CT HEAD FINDINGS Brain: A small hypodensity in the  left thalamus corresponds to the acute infarct on MRI. No intracranial hemorrhage, mass, midline shift, or extra-axial fluid collection is identified. Hypodensities in the cerebral white matter bilaterally are nonspecific but compatible with moderate chronic small vessel ischemic disease. There is mild cerebral atrophy. A cavum septum pellucidum et vergae is noted, a normal variant. Vascular: Calcified atherosclerosis at the skull base. Skull: No fracture or suspicious osseous lesion. Sinuses: Paranasal sinuses and mastoid air cells are clear. Orbits: Unremarkable. Review of the MIP images confirms the above findings CTA NECK FINDINGS Aortic arch: Normal variant aortic arch branching pattern with common origin of the common carotid arteries and an aberrant right subclavian artery which courses posterior to the esophagus. Widely patent arch vessel origins. Right carotid system: Patent without evidence of stenosis, dissection, or significant atherosclerosis. Left carotid system: Patent without evidence of stenosis, dissection, or significant atherosclerosis. Tortuous mid cervical ICA. Vertebral arteries: Patent without evidence of stenosis, dissection, or significant atherosclerosis. Strongly dominant left vertebral artery. Skeleton: Advanced cervical facet arthrosis with multilevel degenerative grade 1 anterolisthesis. Other neck: No evidence of cervical lymphadenopathy or mass. Upper chest: Clear lung apices. Review of the MIP images confirms the above findings CTA HEAD FINDINGS Anterior circulation: The internal carotid arteries are widely patent from skull base to carotid termini. There is a 2 mm aneurysm projecting inferiorly from the right supraclinoid ICA. ACAs and MCAs are patent without evidence of a proximal branch occlusion or significant proximal stenosis. Posterior circulation: The intracranial vertebral arteries are patent with the left supplying the basilar in the right ending in PICA. The basilar  artery is widely patent. There is a fetal origin of the left PCA. The PCAs are patent without evidence of a significant proximal stenosis. No aneurysm is identified. Venous sinuses: Patent. Anatomic variants: Hypoplastic right vertebral artery ending in PICA. Review of the MIP images confirms the above findings IMPRESSION: 1. No large vessel occlusion or significant stenosis in the head and neck. 2. 2 mm right supraclinoid ICA aneurysm. 3. Known acute left thalamic infarct. Electronically Signed   By: Logan Bores M.D.   On: 07/13/2020 15:23   CT ANGIO NECK W OR WO CONTRAST  Result Date: 07/13/2020 CLINICAL DATA:  Right-sided numbness. Acute left thalamic infarct on MRI. EXAM: CT ANGIOGRAPHY HEAD AND NECK TECHNIQUE: Multidetector CT imaging of the head and neck was performed using the standard protocol during bolus administration of intravenous contrast. Multiplanar CT image reconstructions and MIPs were obtained to evaluate the vascular anatomy. Carotid stenosis measurements (when applicable) are obtained utilizing NASCET criteria, using the distal internal carotid diameter as the denominator. CONTRAST:  3mL OMNIPAQUE IOHEXOL 350 MG/ML SOLN COMPARISON:  Head MRI 07/13/2020 FINDINGS: CT HEAD FINDINGS Brain: A small hypodensity in the left thalamus corresponds to the acute infarct on MRI. No intracranial hemorrhage, mass, midline shift, or extra-axial fluid collection is identified. Hypodensities in the cerebral white matter bilaterally are nonspecific but compatible with moderate chronic small vessel ischemic disease. There is mild cerebral atrophy. A cavum septum pellucidum et vergae is noted,  a normal variant. Vascular: Calcified atherosclerosis at the skull base. Skull: No fracture or suspicious osseous lesion. Sinuses: Paranasal sinuses and mastoid air cells are clear. Orbits: Unremarkable. Review of the MIP images confirms the above findings CTA NECK FINDINGS Aortic arch: Normal variant aortic arch  branching pattern with common origin of the common carotid arteries and an aberrant right subclavian artery which courses posterior to the esophagus. Widely patent arch vessel origins. Right carotid system: Patent without evidence of stenosis, dissection, or significant atherosclerosis. Left carotid system: Patent without evidence of stenosis, dissection, or significant atherosclerosis. Tortuous mid cervical ICA. Vertebral arteries: Patent without evidence of stenosis, dissection, or significant atherosclerosis. Strongly dominant left vertebral artery. Skeleton: Advanced cervical facet arthrosis with multilevel degenerative grade 1 anterolisthesis. Other neck: No evidence of cervical lymphadenopathy or mass. Upper chest: Clear lung apices. Review of the MIP images confirms the above findings CTA HEAD FINDINGS Anterior circulation: The internal carotid arteries are widely patent from skull base to carotid termini. There is a 2 mm aneurysm projecting inferiorly from the right supraclinoid ICA. ACAs and MCAs are patent without evidence of a proximal branch occlusion or significant proximal stenosis. Posterior circulation: The intracranial vertebral arteries are patent with the left supplying the basilar in the right ending in PICA. The basilar artery is widely patent. There is a fetal origin of the left PCA. The PCAs are patent without evidence of a significant proximal stenosis. No aneurysm is identified. Venous sinuses: Patent. Anatomic variants: Hypoplastic right vertebral artery ending in PICA. Review of the MIP images confirms the above findings IMPRESSION: 1. No large vessel occlusion or significant stenosis in the head and neck. 2. 2 mm right supraclinoid ICA aneurysm. 3. Known acute left thalamic infarct. Electronically Signed   By: Logan Bores M.D.   On: 07/13/2020 15:23   MR BRAIN WO CONTRAST  Result Date: 07/13/2020 CLINICAL DATA:  Right-sided numbness. EXAM: MRI HEAD WITHOUT CONTRAST TECHNIQUE:  Multiplanar, multiecho pulse sequences of the brain and surrounding structures were obtained without intravenous contrast. COMPARISON:  Head CT 07/12/2020 FINDINGS: Brain: There is a 1 cm acute infarct laterally in the left thalamus. No intracranial hemorrhage, mass, midline shift, or extra-axial fluid collection is identified. T2 hyperintensity scattered throughout the cerebral white matter bilaterally are nonspecific but compatible with moderately age advanced chronic small vessel ischemic disease. There is mild cerebral atrophy. A cavum septum pellucidum et vergae is incidentally noted. Vascular: Major intracranial vascular flow voids are preserved. Skull and upper cervical spine: Unremarkable bone marrow signal. Grade 1 anterolisthesis of C3 on C4 and C4 on C5. Sinuses/Orbits: Unremarkable orbits. Minimal mucosal thickening in the ethmoid sinuses. Clear mastoid air cells. Other: None. IMPRESSION: 1. Acute left thalamic infarct. 2. Moderate chronic small vessel ischemic disease. Electronically Signed   By: Logan Bores M.D.   On: 07/13/2020 10:58   ECHOCARDIOGRAM COMPLETE  Result Date: 07/14/2020    ECHOCARDIOGRAM REPORT   Patient Name:   Valerie Hobbs Date of Exam: 07/14/2020 Medical Rec #:  [id number]            Height:       62.0 in Accession #:    [id number]           Weight:       160.0 lb Date of Birth:  February 25, 1961            BSA:          1.739 m Patient Age:    48 years  BP:           155/76 mmHg Patient Gender: F                    HR:           74 bpm. Exam Location:  Inpatient Procedure: 2D Echo, Cardiac Doppler and Color Doppler Indications:    TIA 435.9 / G45.9  History:        Patient has no prior history of Echocardiogram examinations.                 Risk Factors:Hypertension.  Sonographer:    Elmarie Shileyiffany Dance Referring Phys: [id number] Emeline GeneralPING T ZHANG IMPRESSIONS  1. Left ventricular ejection fraction, by estimation, is 60 to 65%. The left ventricle has normal function. The left  ventricle has no regional wall motion abnormalities. Left ventricular diastolic parameters were normal.  2. Right ventricular systolic function is normal. The right ventricular size is normal. There is normal pulmonary artery systolic pressure.  3. Left atrial size was mildly dilated.  4. The mitral valve is normal in structure. Trivial mitral valve regurgitation. No evidence of mitral stenosis.  5. The aortic valve is tricuspid. Aortic valve regurgitation is not visualized. No aortic stenosis is present.  6. The inferior vena cava is normal in size with greater than 50% respiratory variability, suggesting right atrial pressure of 3 mmHg. FINDINGS  Left Ventricle: Left ventricular ejection fraction, by estimation, is 60 to 65%. The left ventricle has normal function. The left ventricle has no regional wall motion abnormalities. The left ventricular internal cavity size was normal in size. There is  no left ventricular hypertrophy. Left ventricular diastolic parameters were normal. Right Ventricle: The right ventricular size is normal. No increase in right ventricular wall thickness. Right ventricular systolic function is normal. There is normal pulmonary artery systolic pressure. The tricuspid regurgitant velocity is 2.04 m/s, and  with an assumed right atrial pressure of 3 mmHg, the estimated right ventricular systolic pressure is 19.6 mmHg. Left Atrium: Left atrial size was mildly dilated. Right Atrium: Right atrial size was normal in size. Pericardium: There is no evidence of pericardial effusion. Mitral Valve: The mitral valve is normal in structure. There is mild thickening of the mitral valve leaflet(s). There is mild calcification of the mitral valve leaflet(s). Trivial mitral valve regurgitation. No evidence of mitral valve stenosis. Tricuspid Valve: The tricuspid valve is normal in structure. Tricuspid valve regurgitation is trivial. No evidence of tricuspid stenosis. Aortic Valve: The aortic valve is  tricuspid. Aortic valve regurgitation is not visualized. No aortic stenosis is present. Pulmonic Valve: The pulmonic valve was normal in structure. Pulmonic valve regurgitation is not visualized. No evidence of pulmonic stenosis. Aorta: The aortic root is normal in size and structure. Venous: The inferior vena cava is normal in size with greater than 50% respiratory variability, suggesting right atrial pressure of 3 mmHg. IAS/Shunts: No atrial level shunt detected by color flow Doppler.  LEFT VENTRICLE PLAX 2D LVIDd:         4.20 cm  Diastology LVIDs:         3.20 cm  LV e' medial:    11.40 cm/s LV PW:         1.10 cm  LV E/e' medial:  7.7 LV IVS:        0.90 cm  LV e' lateral:   10.40 cm/s LVOT diam:     1.70 cm  LV E/e' lateral: 8.5 LV SV:  63 LV SV Index:   36 LVOT Area:     2.27 cm  RIGHT VENTRICLE             IVC RV Basal diam:  2.70 cm     IVC diam: 2.10 cm RV S prime:     12.40 cm/s TAPSE (M-mode): 2.1 cm LEFT ATRIUM             Index       RIGHT ATRIUM           Index LA diam:        4.00 cm 2.30 cm/m  RA Area:     10.70 cm LA Vol (A2C):   46.1 ml 26.51 ml/m RA Volume:   22.90 ml  13.17 ml/m LA Vol (A4C):   57.7 ml 33.19 ml/m LA Biplane Vol: 54.9 ml 31.57 ml/m  AORTIC VALVE LVOT Vmax:   132.00 cm/s LVOT Vmean:  83.000 cm/s LVOT VTI:    0.276 m  AORTA Ao Root diam: 3.00 cm Ao Asc diam:  3.30 cm MITRAL VALVE               TRICUSPID VALVE MV Area (PHT): 2.73 cm    TR Peak grad:   16.6 mmHg MV Decel Time: 278 msec    TR Vmax:        204.00 cm/s MV E velocity: 88.30 cm/s MV A velocity: 51.90 cm/s  SHUNTS MV E/A ratio:  1.70        Systemic VTI:  0.28 m                            Systemic Diam: 1.70 cm Jenkins Rouge MD Electronically signed by Jenkins Rouge MD Signature Date/Time: 07/14/2020/12:38:29 PM    Final      Labs:   Basic Metabolic Panel: Recent Labs  Lab 07/12/20 1127 07/13/20 0819  NA 138 141  K 3.2* 3.9  CL 104 107  CO2 23 23  GLUCOSE 101* 97  BUN 11 11  CREATININE 0.81  0.84  CALCIUM 8.8* 9.1  MG  --  2.1   GFR Estimated Creatinine Clearance: 66.4 mL/min (by C-G formula based on SCr of 0.84 mg/dL). Liver Function Tests: Recent Labs  Lab 07/12/20 1127  AST 33  ALT 20  ALKPHOS 43  BILITOT 0.3  PROT 7.1  ALBUMIN 4.0   No results for input(s): LIPASE, AMYLASE in the last 168 hours. No results for input(s): AMMONIA in the last 168 hours. Coagulation profile Recent Labs  Lab 07/12/20 1127  INR 1.0    CBC: Recent Labs  Lab 07/12/20 1127 07/13/20 0819  WBC 10.4 7.5  NEUTROABS 6.8 4.1  HGB 13.1 13.1  HCT 38.2 38.4  MCV 101.9* 102.4*  PLT 279 287   Cardiac Enzymes: No results for input(s): CKTOTAL, CKMB, CKMBINDEX, TROPONINI in the last 168 hours. BNP: Invalid input(s): POCBNP CBG: No results for input(s): GLUCAP in the last 168 hours. D-Dimer No results for input(s): DDIMER in the last 72 hours. Hgb A1c Recent Labs    07/14/20 0025  HGBA1C 5.3   Lipid Profile Recent Labs    07/14/20 0025  CHOL 154  HDL 45  LDLCALC 76  TRIG 163*  CHOLHDL 3.4   Thyroid function studies No results for input(s): TSH, T4TOTAL, T3FREE, THYROIDAB in the last 72 hours.  Invalid input(s): FREET3 Anemia work up Recent Labs    07/13/20 [place]*  Microbiology No results found for this or any previous visit (from the past 240 hour(s)).   Discharge Instructions:   Discharge Instructions    Ambulatory referral to Neurology   Complete by: As directed    An appointment is requested in approximately: 6 weeks   Diet - low sodium heart healthy   Complete by: As directed    Discharge instructions   Complete by: As directed    aspirin and Plavix for 3 weeks followed by aspirin alone.   Patient counseled to quit marijuana and cigarettes.   Aggressive risk factor modification with strict control of hypertension blood pressure goal below 130/90 Discuss Portia with PCP or prescribing MD   Increase activity slowly   Complete by:  As directed      Allergies as of 07/14/2020      Reactions   Ciprofloxacin Nausea Only   Headache   Penicillins    Allergy has been more than 32yrs but unsure of what reaction was      Medication List    STOP taking these medications   omeprazole 20 MG capsule Commonly known as: PRILOSEC   Portia-28 0.15-30 MG-MCG tablet Generic drug: levonorgestrel-ethinyl estradiol     TAKE these medications   acyclovir 400 MG tablet Commonly known as: ZOVIRAX TAKE 1 TABLET 2 X /DAY FOR FEVER BLISTERS What changed: See the new instructions.   allopurinol 300 MG tablet Commonly known as: ZYLOPRIM TAKE 1/2 TO 1 TAB BY MOUTH EVERY DAY TO PREVENT GOUT What changed:   how much to take  how to take this  when to take this  additional instructions   aspirin 81 MG EC tablet Take 1 tablet (81 mg total) by mouth daily. Swallow whole. Start taking on: July 15, 2020   atenolol 100 MG tablet Commonly known as: TENORMIN TAKE 1 TABLET BY MOUTH  DAILY FOR BLOOD PRESSURE What changed:   how much to take  how to take this  when to take this  additional instructions   atorvastatin 40 MG tablet Commonly known as: Lipitor Take 1 tablet (40 mg total) by mouth daily.   cholecalciferol 1000 units tablet Commonly known as: VITAMIN D Take 1,000 Units by mouth daily.   clobetasol 0.05 % external solution Commonly known as: TEMOVATE Apply 1 application topically 2 (two) times daily.   clopidogrel 75 MG tablet Commonly known as: PLAVIX Take 1 tablet (75 mg total) by mouth daily. Start taking on: July 15, 2020   Fish Oil 1000 MG Caps Take 1,000 mg by mouth daily.   furosemide 40 MG tablet Commonly known as: LASIX TAKE 1 TABLET BY MOUTH  DAILY FOR BLOOD PRESSURE  AND FLUID RETENTION / ANKLE SWELLING What changed:   how much to take  how to take this  when to take this  additional instructions   MAGNESIUM PO Take 1,600 mg by mouth daily.   milk thistle 175 MG  tablet Take 175 mg by mouth daily.   montelukast 10 MG tablet Commonly known as: SINGULAIR Take    1 tablet    Daily      for Allergies What changed:   how much to take  how to take this  when to take this  additional instructions   NovoFine 32G X 6 MM Misc Generic drug: Insulin Pen Needle Needs daily for saxenda   telmisartan 80 MG tablet Commonly known as: MICARDIS TAKE 1 TABLET DAILY FOR  BLOOD PRESSURE What changed:   how much to take  how to take this  when to take this  additional instructions   traZODone 100 MG tablet Commonly known as: DESYREL TAKE 1/2 TO 1 TABLET BY MOUTH 1 HOUR BEFORE BEDTIME IF NEEDED FOR SLEEP What changed:   how much to take  how to take this  when to take this  reasons to take this  additional instructions   VITAMIN C PO Take 1 tablet by mouth daily.       Follow-up Information    Unk Pinto, MD Follow up in 1 week(s).   Specialty: Internal Medicine Why: discuss [facility] information: [address] [place] [address] [phone]                Time coordinating discharge: 25 min  Signed:  Geradine Girt DO  [facility] 07/14/2020, 2:08 PM

## 2020-07-14 NOTE — Progress Notes (Signed)
STROKE TEAM PROGRESS NOTE   HISTORY OF PRESENT ILLNESS (per record) Valerie Hobbs is a 60 y.o. female past medical history of hypertension, anxiety, asthma, presented to the emergency room for evaluation of sudden onset of right-sided numbness She had presented to [facility] yesterday for evaluation of numbness that started when she woke up Wednesday- 07/11/2020 in the morning.  She was seen at the emergency room at [facility] and recommended she come to [facility] for an MRI but she refused transportation for MRI.  She went back home and symptoms did not improve, hence she came to the emergency room at [facility] Her last known well was when she went to bed Tuesday, 05/10/2021, and woke up with the symptoms. The symptoms she describes as somebody has given Novocain on the right side of the body including face arm and leg.  She says her face feels swollen on the right side. She denies any headaches.  Denies any tingling. Denies any numbness elsewhere in the body.  Denies any visual changes. No prior history of strokes No family history of strokes or heart attacks in young people. Mother had a heart attack at an older age Non-smoker.  No drug abuse.  LKW: At least 3 days ago-Tuesday, 07/10/2020 tpa given?: no, outside the window Premorbid modified Rankin scale (mRS): 0   INTERVAL HISTORY Her husband is at the bedside.  She continues to have right-sided numbness but it appears to be improving.  She has no new complaints today.  Vital signs are stable.  MRI scan shows a small left thalamic lacunar infarct.  CT angiogram of the brain and neck did not show significant large vessel stenosis or occlusion.  Echocardiogram is pending.  Urine drug screen is positive for marijuana.  LDL cholesterol is borderline at 76 mg percent and hemoglobin A1c is 5.3.   OBJECTIVE Vitals:   07/13/20 1823 07/13/20 1959 07/14/20 0009 07/14/20 0420  BP: (!) 189/88 (!) 161/85  (!) 150/80 (!) 166/80  Pulse: 66 66 67 72  Resp: 16 18 19 16   Temp: 99 F (37.2 C) 97.9 F (36.6 C) 98.3 F (36.8 C) 98.6 F (37 C)  TempSrc: Oral Oral Oral Oral  SpO2: 99% 98% 97% 98%  Weight:      Height:        CBC:  Recent Labs  Lab 07/12/20 1127 07/13/20 0819  WBC 10.4 7.5  NEUTROABS 6.8 4.1  HGB 13.1 13.1  HCT 38.2 38.4  MCV 101.9* 102.4*  PLT 279 295    Basic Metabolic Panel:  Recent Labs  Lab 07/12/20 1127 07/13/20 0819  NA 138 141  K 3.2* 3.9  CL 104 107  CO2 23 23  GLUCOSE 101* 97  BUN 11 11  CREATININE 0.81 0.84  CALCIUM 8.8* 9.1  MG  --  2.1    Lipid Panel:     Component Value Date/Time   CHOL 154 07/14/2020 0025   TRIG 163 (H) 07/14/2020 0025   HDL 45 07/14/2020 0025   CHOLHDL 3.4 07/14/2020 0025   VLDL 33 07/14/2020 0025   LDLCALC 76 07/14/2020 0025   LDLCALC 104 (H) 06/03/2019 1024   HgbA1c:  Lab Results  Component Value Date   HGBA1C 5.3 07/14/2020   Urine Drug Screen:     Component Value Date/Time   LABOPIA NONE DETECTED 07/12/2020 1204   COCAINSCRNUR NONE DETECTED 07/12/2020 1204   LABBENZ NONE DETECTED 07/12/2020 1204   AMPHETMU NONE DETECTED  07/12/2020 1204   THCU POSITIVE (A) 07/12/2020 1204   LABBARB NONE DETECTED 07/12/2020 1204    Alcohol Level     Component Value Date/Time   ETH <10 07/12/2020 1127    IMAGING  CT ANGIO HEAD W OR WO CONTRAST CT ANGIO NECK W OR WO CONTRAST 07/13/2020 IMPRESSION:  1. No large vessel occlusion or significant stenosis in the head and neck.  2. 2 mm right supraclinoid ICA aneurysm.  3. Known acute left thalamic infarct.   CT HEAD WO CONTRAST 07/12/2020 IMPRESSION:  1. No acute intracranial findings.  2. Chronic microvascular ischemic change and cerebral volume loss.   MR BRAIN WO CONTRAST 07/13/2020 IMPRESSION: 1. Acute left thalamic infarct.  2. Moderate chronic small vessel ischemic disease.   Transthoracic Echocardiogram  Normal left ventricular ejection fraction of  60-65%.  No cardiac source of embolism  ECG - SR rate 73 BPM. (See cardiology reading for complete details)  PHYSICAL EXAM Blood pressure (!) 166/80, pulse 72, temperature 98.6 F (37 C), temperature source Oral, resp. rate 16, height 5\' 2"  (1.575 m), weight 72.6 kg, SpO2 98 %. Pleasant mildly obese middle-age Caucasian lady not in distress. . Afebrile. Head is nontraumatic. Neck is supple without bruit.    Cardiac exam no murmur or gallop. Lungs are clear to auscultation. Distal pulses are well felt.  Neurological Exam ;  Awake  Alert oriented x 3. Normal speech and language.eye movements full without nystagmus.fundi were not visualized. Vision acuity and fields appear normal. Hearing is normal. Palatal movements are normal. Face symmetric. Tongue midline. Normal strength, tone, reflexes and coordination.  Diminished right face and hemibody touch pinprick sensation. Gait deferred.    ASSESSMENT/PLAN [name] is a 60 y.o. female with history of hypertension, anxiety, and asthma, who presented to the emergency room for evaluation of sudden onset of right-sided numbness. She did not receive IV t-PA due to late presentation (>4.5 hours from time of onset)   Stroke: acute left thalamic infarct likely due to small vessel disease.   Resultant right hemibody paresthesias   code Stroke CT Head - not ordered  CT head - No acute intracranial findings. Chronic microvascular ischemic change and cerebral volume loss.   MRI head - Acute left thalamic infarct. Moderate chronic small vessel ischemic disease.   MRA head - not ordered  CTA H&N - No large vessel occlusion or significant stenosis in the head and neck. 2 mm right supraclinoid ICA aneurysm. Known acute left thalamic infarct.   CT Perfusion - not ordered  Carotid Doppler - CTA neck ordered - carotid dopplers not indicated.  2D Echo -normal ejection fraction without cardiac source of embolism.  Sars Corona Virus 2 -  pending  LDL - 76  HgbA1c - 5.3  UDS - THCU  VTE prophylaxis - Lovenox Diet  Diet Order            Diet Heart Room service appropriate? Yes; Fluid consistency: Thin  Diet effective now                 No antithrombotic prior to admission, now on aspirin 81 mg daily and clopidogrel 75 mg daily  Patient will be counseled to be compliant with her antithrombotic medications  Ongoing aggressive stroke risk factor management Therapy recommendations: None 9 10/29/1948 disposition: Home Hypertension  Home BP meds: Micardis ; atenolol   Current BP meds: none   Stable . Permissive hypertension (OK if < 220/120) but gradually normalize in 5-7 days  .  Long-term BP goal normotensive  Hyperlipidemia  Home Lipid lowering medication: none   LDL 76, goal < 70  Current lipid lowering medication: none - consider adding Lipitor 40 mg daily  Continue statin at discharge  Other Stroke Risk Factors  ETOH use, advised to drink no more than 1 alcoholic beverage per day.  Obesity, Body mass index is 29.26 kg/m., recommend weight loss, diet and exercise as appropriate   Substance Abuse - Marijuana  Other Active Problems, Findings, Recommendations and/or Plan  Code status - Full code  2 mm right supraclinoid ICA aneurysm - outpt f/u  Hospital day # 0 She presented with sudden onset of right hemibody paresthesia due to left thalamic infarct likely from small vessel disease.  Recommend aspirin and Plavix for 3 weeks followed by aspirin alone.  Patient counseled to quit marijuana and cigarettes.  Aggressive risk factor modification with strict control of hypertension blood pressure goal below 130/90, lipids with LDL cholesterol goal below 70 mg percent and diabetes with hemoglobin A1c goal of 5.3%.  She patient may be discharged home and can follow-up as an outpatient with stroke clinic in 6 weeks.  Discussed with patient husband and Dr. Eliseo Squires.  Greater than 50% time during this 35-minute  visit was spent in counseling and coordination of care about lacunar stroke and discussion about stroke risk, prevention and treatment and answering questions Antony Contras, MD To contact Stroke Continuity provider, please refer to [email or web address] After hours, contact General Neurology

## 2020-07-14 NOTE — Progress Notes (Signed)
  Echocardiogram 2D Echocardiogram has been performed.  Valerie Hobbs 07/14/2020, 12:32 PM

## 2020-07-14 NOTE — Evaluation (Addendum)
Occupational Therapy Evaluation and Discharge Patient Details Name: Valerie Hobbs MRN: [id number] DOB: 1961/02/29 Today's Date: 07/14/2020    History of Present Illness Pt is a 60 y/o female admitted secondary to R extremity numbness and tingling. MRI revealed L thalamic infarct. PMH includes asthma, HTN, and gout.   Clinical Impression   This 60 y/o female presents with the above. PTA pt very independent with ADL, iADL and functional mobility. Today pt performing mobility and ADL tasks without AD at mod independent - independent level. Pt with no LOB noted and with no significant difficulty despite having some remaining R side deficits (minimal). Pt does endorse decreased sensation in RUE (improved from initial admit/presentation) and with mild coordination deficits. Issued UE fine motor/coordination HEP and reviewed during session with pt verbalizing understanding. Pt verbalizes understanding of education on signs/symptoms of CVA. All questions answered with no further acute OT needs identified at this time. Pt reports plans to return home with spouse who can assist PRN after discharge. Acute OT to sign off at this time. Thank you for this referral.     Follow Up Recommendations  No OT follow up;Supervision - Intermittent    Equipment Recommendations  None recommended by OT           Precautions / Restrictions Precautions Precautions: None Restrictions Weight Bearing Restrictions: No      Mobility Bed Mobility Overal bed mobility: Independent                  Transfers Overall transfer level: Independent                    Balance Overall balance assessment: Independent                                         ADL either performed or assessed with clinical judgement   ADL Overall ADL's : At baseline                                       General ADL Comments: despite remaining R side deficits pt able to perform  multiple bouts of mobility in room, toileting, standing grooming and LB ADL tasks without significant difficulty. suspect pt is likely very close to her baseline                         Pertinent Vitals/Pain Pain Assessment: No/denies pain     Hand Dominance Right   Extremity/Trunk Assessment Upper Extremity Assessment Upper Extremity Assessment: RUE deficits/detail RUE Deficits / Details: pt with some residual numbness throughout RUE, mild coordination deficits, pt reports UE not quite back to 100% baseline RUE Sensation: decreased light touch RUE Coordination: decreased fine motor   Lower Extremity Assessment Lower Extremity Assessment: Defer to PT evaluation   Cervical / Trunk Assessment Cervical / Trunk Assessment: Normal   Communication Communication Communication: No difficulties   Cognition Arousal/Alertness: Awake/alert Behavior During Therapy: WFL for tasks assessed/performed Overall Cognitive Status: Within Functional Limits for tasks assessed                                     General Comments  pt reports understanding of signs/symptoms of CVA  Exercises Exercises: Other exercises Other Exercises Other Exercises: issued fine motor/coordination HEP and reviewed with pt during session.   Shoulder Instructions      Home Living Family/patient expects to be discharged to:: Private residence Living Arrangements: Spouse/significant other Available Help at Discharge: Family Type of Home: House Home Access: Stairs to enter [employer] of Steps: 10 Entrance Stairs-Rails: Left Home Layout: One level     Bathroom Shower/Tub: Walk-in shower;Tub/shower unit   Bathroom Toilet: Standard     Home Equipment: None          Prior Functioning/Environment Level of Independence: Independent                 OT Problem List: Decreased coordination;Impaired UE functional use;Impaired sensation      OT  Treatment/Interventions:      OT Goals(Current goals can be found in the care plan section) Acute Rehab OT Goals Patient Stated Goal: to go home OT Goal Formulation: All assessment and education complete, DC therapy  OT Frequency:     Barriers to D/C:            Co-evaluation              AM-PAC OT "6 Clicks" Daily Activity     Outcome Measure Help from another person eating meals?: None Help from another person taking care of personal grooming?: None Help from another person toileting, which includes using toliet, bedpan, or urinal?: None Help from another person bathing (including washing, rinsing, drying)?: None Help from another person to put on and taking off regular upper body clothing?: None Help from another person to put on and taking off regular lower body clothing?: None 6 Click Score: 24   End of Session Nurse Communication: Mobility status  Activity Tolerance: Patient tolerated treatment well Patient left: in bed;with call bell/phone within reach  OT Visit Diagnosis: Other symptoms and signs involving the nervous system (R29.898)                Time: 7017-7939 OT Time Calculation (min): 15 min Charges:  OT General Charges $OT Visit: 1 Visit OT Evaluation $OT Eval Low Complexity: 1 Low  Lou Cal, OT Acute Rehabilitation Services Pager [phone] Office [phone]   Raymondo Band 07/14/2020, 10:07 AM

## 2020-07-14 NOTE — Progress Notes (Signed)
Nsg Discharge Note  Admit Date:  07/13/2020 Discharge date: 07/14/2020   Paschal Dopp to be D/C'd Home per MD order.  AVS completed.  Copy for chart, and copy for patient signed, and dated. Patient/caregiver able to verbalize understanding.  Discharge Medication: Allergies as of 07/14/2020      Reactions   Ciprofloxacin Nausea Only   Headache   Penicillins    Allergy has been more than 80yrs but unsure of what reaction was      Medication List    STOP taking these medications   omeprazole 20 MG capsule Commonly known as: PRILOSEC   Portia-28 0.15-30 MG-MCG tablet Generic drug: levonorgestrel-ethinyl estradiol     TAKE these medications   acyclovir 400 MG tablet Commonly known as: ZOVIRAX TAKE 1 TABLET 2 X /DAY FOR FEVER BLISTERS What changed: See the new instructions.   allopurinol 300 MG tablet Commonly known as: ZYLOPRIM TAKE 1/2 TO 1 TAB BY MOUTH EVERY DAY TO PREVENT GOUT What changed:   how much to take  how to take this  when to take this  additional instructions   aspirin 81 MG EC tablet Take 1 tablet (81 mg total) by mouth daily. Swallow whole. Start taking on: July 15, 2020   atenolol 100 MG tablet Commonly known as: TENORMIN TAKE 1 TABLET BY MOUTH  DAILY FOR BLOOD PRESSURE What changed:   how much to take  how to take this  when to take this  additional instructions   atorvastatin 40 MG tablet Commonly known as: Lipitor Take 1 tablet (40 mg total) by mouth daily.   cholecalciferol 1000 units tablet Commonly known as: VITAMIN D Take 1,000 Units by mouth daily.   clobetasol 0.05 % external solution Commonly known as: TEMOVATE Apply 1 application topically 2 (two) times daily.   clopidogrel 75 MG tablet Commonly known as: PLAVIX Take 1 tablet (75 mg total) by mouth daily. Start taking on: July 15, 2020   Fish Oil 1000 MG Caps Take 1,000 mg by mouth daily.   furosemide 40 MG tablet Commonly known as: LASIX TAKE 1  TABLET BY MOUTH  DAILY FOR BLOOD PRESSURE  AND FLUID RETENTION / ANKLE SWELLING What changed:   how much to take  how to take this  when to take this  additional instructions   MAGNESIUM PO Take 1,600 mg by mouth daily.   milk thistle 175 MG tablet Take 175 mg by mouth daily.   montelukast 10 MG tablet Commonly known as: SINGULAIR Take    1 tablet    Daily      for Allergies What changed:   how much to take  how to take this  when to take this  additional instructions   NovoFine 32G X 6 MM Misc Generic drug: Insulin Pen Needle Needs daily for saxenda   telmisartan 80 MG tablet Commonly known as: MICARDIS TAKE 1 TABLET DAILY FOR  BLOOD PRESSURE What changed:   how much to take  how to take this  when to take this  additional instructions   traZODone 100 MG tablet Commonly known as: DESYREL TAKE 1/2 TO 1 TABLET BY MOUTH 1 HOUR BEFORE BEDTIME IF NEEDED FOR SLEEP What changed:   how much to take  how to take this  when to take this  reasons to take this  additional instructions   VITAMIN C PO Take 1 tablet by mouth daily.       Discharge Assessment: Vitals:   07/14/20 0823 07/14/20 1221  BP: (!) 155/76 (!) 176/89  Pulse: 73 68  Resp: 17 18  Temp: 98 F (36.7 C) 98.4 F (36.9 C)  SpO2: 97% 98%   Skin clean, dry and intact without evidence of skin break down, no evidence of skin tears noted. IV catheter discontinued intact. Site without signs and symptoms of complications - no redness or edema noted at insertion site, patient denies c/o pain - only slight tenderness at site.  Dressing with slight pressure applied.  D/c Instructions-Education: Discharge instructions given to patient/family with verbalized understanding. D/c education completed with patient/family including follow up instructions, medication list, d/c activities limitations if indicated, with other d/c instructions as indicated by MD - patient able to verbalize understanding,  all questions fully answered. Patient instructed to return to ED, call 911, or call MD for any changes in condition.  Patient escorted via [place], and D/C home via private auto.  Erasmo Leventhal, RN 07/14/2020 2:53 PM

## 2020-07-14 NOTE — Evaluation (Signed)
Speech Language Pathology Evaluation Patient Details Name: Valerie Hobbs MRN: [id number] DOB: 1961/02/12 Today's Date: 07/14/2020 Time: 4081-4481 SLP Time Calculation (min) (ACUTE ONLY): 11 min  Problem List:  Patient Active Problem List   Diagnosis Date Noted  . Thalamic stroke ([place]) 07/13/2020  . Gout 12/12/2015  . Hyperlipidemia 12/12/2015  . Vitamin D deficiency 12/12/2015  . Hypokalemia 07/15/2013  . Hypertension   . Allergy   . Anemia   . Anxiety   . Asthma    Past Medical History:  Past Medical History:  Diagnosis Date  . Allergy   . Anemia   . Anxiety   . Asthma   . Hypertension    Past Surgical History: History reviewed. No pertinent surgical history. HPI:  Pt is a 60 y/o female admitted secondary to R extremity numbness and tingling. MRI revealed L thalamic infarct. PMH includes asthma, HTN, and gout.   Assessment / Plan / Recommendation Clinical Impression  Pt presents with normal expressive and receptive language function, fluent and clear speech without dysarthria.  Cognitive function is WNL. Reviewed BE-FAST acronym to reinforce PT's education. No further SLP f/u warranted. Our service will sign off.    SLP Assessment  SLP Recommendation/Assessment: Patient does not need any further Speech Lanaguage Pathology Services SLP Visit Diagnosis: Cognitive communication deficit (R41.841)    Follow Up Recommendations  None               SLP Evaluation Cognition  Overall Cognitive Status: Within Functional Limits for tasks assessed Orientation Level: Oriented X4       Comprehension  Auditory Comprehension Overall Auditory Comprehension: Appears within functional limits for tasks assessed Yes/No Questions: Within Functional Limits Commands: Within Functional Limits Visual Recognition/Discrimination Discrimination: Within Function Limits Reading Comprehension Reading Status: Within funtional limits    Expression Verbal Expression Overall  Verbal Expression: Appears within functional limits for tasks assessed Written Expression Dominant Hand: Right   Oral / Motor  Oral Motor/Sensory Function Overall Oral Motor/Sensory Function: Within functional limits Motor Speech Overall Motor Speech: Appears within functional limits for tasks assessed   GO                    [name] 07/14/2020, 10:14 AM  [name], [place] Office number [phone] Pager [phone]

## 2020-07-18 ENCOUNTER — Other Ambulatory Visit: Payer: Self-pay | Admitting: Internal Medicine

## 2020-07-18 MED ORDER — GABAPENTIN 100 MG PO CAPS: ORAL_CAPSULE | ORAL | 2 refills | Status: DC

## 2020-07-19 ENCOUNTER — Telehealth: Payer: Self-pay | Admitting: *Deleted

## 2020-07-19 NOTE — Telephone Encounter (Signed)
I have attempted to contact this patient by phone with the following results: I will continue to try later.

## 2020-07-23 ENCOUNTER — Telehealth: Payer: Self-pay | Admitting: *Deleted

## 2020-07-23 NOTE — Telephone Encounter (Signed)
I have attempted to contact this patient by phone, but line is busy. I will continue to try later.

## 2020-07-23 NOTE — Telephone Encounter (Signed)
Tried to reach patient unsuccesssfully x 2, but patient has already scheduled a hospital follow up appointment on 07/24/2020.

## 2020-07-24 ENCOUNTER — Other Ambulatory Visit: Payer: Self-pay

## 2020-07-24 ENCOUNTER — Encounter: Payer: Self-pay | Admitting: Internal Medicine

## 2020-07-24 ENCOUNTER — Other Ambulatory Visit: Payer: Self-pay | Admitting: Internal Medicine

## 2020-07-24 ENCOUNTER — Ambulatory Visit (INDEPENDENT_AMBULATORY_CARE_PROVIDER_SITE_OTHER): Payer: 59 | Admitting: Internal Medicine

## 2020-07-24 VITALS — BP 182/98 | HR 81 | Temp 97.5°F | Resp 16 | Ht 63.5 in | Wt 162.4 lb

## 2020-07-24 DIAGNOSIS — E782 Mixed hyperlipidemia: Secondary | ICD-10-CM

## 2020-07-24 DIAGNOSIS — I10 Essential (primary) hypertension: Secondary | ICD-10-CM

## 2020-07-24 DIAGNOSIS — I6381 Other cerebral infarction due to occlusion or stenosis of small artery: Secondary | ICD-10-CM

## 2020-07-24 DIAGNOSIS — I639 Cerebral infarction, unspecified: Secondary | ICD-10-CM

## 2020-07-24 DIAGNOSIS — K2100 Gastro-esophageal reflux disease with esophagitis, without bleeding: Secondary | ICD-10-CM

## 2020-07-24 MED ORDER — PANTOPRAZOLE SODIUM 40 MG PO TBEC: DELAYED_RELEASE_TABLET | ORAL | 0 refills | Status: DC

## 2020-07-24 NOTE — Progress Notes (Signed)
[place]     This very nice 60 y.o.  MWF was admitted to the hospital on 07/13/20  and patient was discharged from the hospital on 07/14/2020. The patient now presents for follow up for transition from recent hospitalization.  The day after discharge  our clinical staff contacted the patient to assure stability and schedule a follow up appointment. The discharge summary, medications and diagnostic test results were reviewed before meeting with the patient. The patient was admitted for:   Thalamic stroke ([place]) Essential hypertension Hyperlipidemia, mixed     Patient presented with a 2 day hx/o total body right-sided numb paresthesias. Head CT scan found only chronic microvascular changes, but Brain MRI showed an acute Left Thalamic CVA.  Cardiac echo was negative. Patient was begun on DAPT with LD bASA /Plavix with recommendations for stopping the Plavix after 3 weeks. BP's were elevated and monitored as permissive Hypertension. Lipid profile found Nl total Chol 154 , HDL 45 , LDL 76 and it was recommended to start aggressive anti-lipid therapy with Atorvastatin 40 mg daily.      Hospitalization discharge instructions and medications are reconciled with the patient.      Patient is also followed with Hypertension, Hyperlipidemia, Pre-Diabetes and Vitamin D Deficiency.      Patient is treated for HTN (1998) & BP had been controlled at home. Today's BP  is elevated and allowed high as per recommendations from [facility] for permissive HTN  -  182/98 right arm & 178/92 left arm. Patient has had no complaints of any cardiac type chest pain, palpitations, dyspnea/orthopnea/PND, dizziness, claudication, or dependent edema.     Hyperlipidemia is controlled with diet. Patient denies myalgias or other med SE's. Last Lipids were  Lab Results  Component Value Date   CHOL 154 07/14/2020   HDL 45 07/14/2020   LDLCALC 76 07/14/2020   TRIG 163 (H) 07/14/2020   CHOLHDL 3.4 07/14/2020       Also, the patient has history of T2_NIDDM PreDiabetes and has had no symptoms of reactive hypoglycemia, diabetic polys, paresthesias or visual blurring.  Last A1c was  Lab Results  Component Value Date   HGBA1C 5.3 07/14/2020      Further, the patient also has history of Vitamin D Deficiency and supplements vitamin D without any suspected side-effects. Last vitamin D was at goal: Lab Results  Component Value Date   VD25OH 62 06/03/2019   Current Outpatient Medications on File Prior to Visit  Medication Sig  . acyclovir (ZOVIRAX) 400 MG tablet TAKE 1 TABLET 2 X /DAY FOR FEVER BLISTERS (Patient taking differently: Take 400 mg by mouth 2 (two) times daily as needed (For fever blisters).)  . allopurinol  300 MG tablet TAKE 1/2 TO 1 TAB BY MOUTH EVERY DAY TO PREVENT GOUT (Patient taking differently: Take 150 mg by mouth daily.)  . VITAMIN C  Take 1 tablet by mouth daily.  Marland Kitchen aspirin EC 81 MG EC tablet Take 1 tablet (81 mg total) by mouth daily. Swallow whole.  Marland Kitchen atenolol  100 MG tablet TAKE 1 TABLET BY MOUTH  DAILY FOR BLOOD PRESSURE (Patient taking differently: Take 100 mg by mouth daily.)  . atorvastatin 40 MG tablet Take 1 tablet (40 mg total) by mouth daily.  Marland Kitchen VITAMIN D 1000 units tablet Take 1,000 Units by mouth daily.  . TEMOVATE 0.05 % external solution Apply 1 application topically 2 (two) times daily.  . clopidogrel 75 MG tablet Take 1 tablet (75 mg total)  by mouth daily.  . furosemide 40 MG tablet Take 40 mg by mouth daily.)  . gabapentin  100 MG capsule   . Insulin Pen Needle (NOVOFINE) 32G X 6 MM MISC Needs daily for saxenda  . MAGNESIUM PO Take 1,600 mg by mouth daily.  . milk thistle 175 MG tablet Take 175 mg by mouth daily.  . montelukast  10 MG tablet Take 1 tablet Daily  . Omega-3 Fatty Acids  1000 MG CAPS Take 1,000 mg by mouth daily.  Marland Kitchen telmisartan  80 MG tablet TAKE 1 TABLET DAILY FOR  BLOOD PRESSURE (Patient taking differently: Take 80 mg by mouth daily.)  .  traZODone (DESYREL) 100 MG tablet TAKE 1/2 TO 1 TABLET BY MOUTH 1 HOUR BEFORE BEDTIME IF NEEDED FOR SLEEP (Patient taking differently: Take 100 mg by mouth at bedtime as needed for sleep.)    Allergies  Allergen Reactions  . Ciprofloxacin Nausea Only    Headache  . Penicillins     Allergy has been more than 76yrs but unsure of what reaction was   PMHx:   Past Medical History:  Diagnosis Date  . Allergy   . Anemia   . Anxiety   . Asthma   . Hypertension    Immunization History  Administered Date(s) Administered  . Influenza-Unspecified 04/09/2016, 04/18/2017  . Pneumococcal Polysaccharide-23 06/30/1996  . Tdap 11/16/2012   No past surgical history on file.\   FHx:    Reviewed / unchanged  SHx:    Reviewed / unchanged  Systems Review:  Constitutional: Denies fever, chills, wt changes, headaches, insomnia, fatigue, night sweats, change in appetite. Eyes: Denies redness, blurred vision, diplopia, discharge, itchy, watery eyes.  ENT: Denies discharge, congestion, post nasal drip, epistaxis, sore throat, earache, hearing loss, dental pain, tinnitus, vertigo, sinus pain, snoring.  CV: Denies chest pain, palpitations, irregular heartbeat, syncope, dyspnea, diaphoresis, orthopnea, PND, claudication or edema. Respiratory: denies cough, dyspnea, DOE, pleurisy, hoarseness, laryngitis, wheezing.  Gastrointestinal: Denies dysphagia, odynophagia, heartburn, reflux, water brash, abdominal pain or cramps, nausea, vomiting, bloating, diarrhea, constipation, hematemesis, melena, hematochezia  or hemorrhoids. Genitourinary: Denies dysuria, frequency, urgency, nocturia, hesitancy, discharge, hematuria or flank pain. Musculoskeletal: Denies arthralgias, myalgias, stiffness, jt. swelling, pain, limping or strain/sprain.  Skin: Denies pruritus, rash, hives, warts, acne, eczema or change in skin lesion(s). Neuro: No weakness, tremor, incoordination, spasms, paresthesia or pain. Psychiatric: Denies  confusion, memory loss or sensory loss. Endo: Denies change in weight, skin or hair change.  Heme/Lymph: No excessive bleeding, bruising or enlarged lymph nodes.  Physical Exam  BP (!) 182/98 Comment: 182/98 right ans 178/92 left arm  Pulse 81   Temp (!) 97.5 F (36.4 C)   Resp 16   Wt 162 lb 6.4 oz (73.7 kg)   SpO2 96%   BMI 29.70 kg/m   Appears well nourished, well groomed  and in no distress.  Eyes: PERRLA, EOMs, conjunctiva no swelling or erythema. Sinuses: No frontal/maxillary tenderness ENT/Mouth: EAC's clear, TM's nl w/o erythema, bulging. Nares clear w/o erythema, swelling, exudates. Oropharynx clear without erythema or exudates. Oral hygiene is good. Tongue normal, non obstructing. Hearing intact.  Neck: Supple. Thyroid nl. Car 2+/2+ without bruits, nodes or JVD. Chest: Respirations nl with BS clear & equal w/o rales, rhonchi, wheezing or stridor.  Cor: Heart sounds normal w/ regular rate and rhythm without sig. murmurs, gallops, clicks or rubs. Peripheral pulses normal and equal  without edema.  Abdomen: Soft & bowel sounds normal. Non-tender w/o guarding, rebound, hernias, masses or  organomegaly.  Lymphatics: Unremarkable.  Musculoskeletal: Full ROM all peripheral extremities, joint stability, 5/5 strength and normal gait.  Skin: Warm, dry without exposed rashes, lesions or ecchymosis apparent.  Neuro: Cranial nerves intact, reflexes equal bilaterally. Sensory-motor testing grossly intact. Tendon reflexes grossly intact. Endorses decreased sensation of Rt side of body and especially of Rt hand.  Pysch: Alert & oriented x 3.  Insight and judgement nl & appropriate. No ideations.  Assessment and Plan:   1. Thalamic stroke ([place])  - Allow grqadual l3evceling out of  2. Essential hypertension - Continue medication, monitor blood pressure at home.  - Continue DASH diet. Reminder to go to the ER if any CP,  SOB, nausea, dizziness, severe HA, changes  vision/speech.   3. Hyperlipidemia, mixed  - Continue diet/meds, exercise,& lifestyle modifications.  - Continue monitor periodic cholesterol/liver & renal functions   - Continue diet, exercise, lifestyle modifications.  - Monitor appropriate labs. - Continue supplementation         Discussed  regular exercise, BP monitoring, weight control to achieve/maintain BMI less than 25 and discussed meds and SE's. Recommended labs to assess and monitor clinical status with further disposition pending results of labs. Over 30 minutes of exam, counseling, chart review was performed.

## 2020-07-24 NOTE — Patient Instructions (Addendum)
Due to recent changes in healthcare laws, you may see the results of your imaging and laboratory studies on MyChart before your provider has had a chance to review them.  We understand that in some cases there may be results that are confusing or concerning to you. Not all laboratory results come back in the same time frame and the provider may be waiting for multiple results in order to interpret others.  Please give [place] 48 hours in order for your provider to thoroughly review all the results before contacting the office for clarification of your results.     ++++++++++++++++++++++++++++++++++  Stop Plavix  (Clopidogreal ) - After Feb 16   ++++++++++++++++++++++++++++++++++  Vit D  & Vit C 1,000 mg   are recommended to help protect  against the Covid-19 and other Corona viruses.    Also it's recommended  to take  Zinc 50 mg  to help  protect against the Covid-19   and best place to get  is also on [employer].[email or web address]  and don't pay more than 6-8 cents /pill !   ===================================== Coronavirus (COVID-19) Are you at risk?  Are you at risk for the Coronavirus (COVID-19)?  To be considered HIGH RISK for Coronavirus (COVID-19), you have to meet the following criteria:  . Traveled to [place], [place], [place], [place] or [place]; or in the [place] to [place], [place], [place]  . or [place]; and have fever, cough, and shortness of breath within the last 2 weeks of travel OR . Been in close contact with a person diagnosed with COVID-19 within the last 2 weeks and have  . fever, cough,and shortness of breath .  . IF YOU DO NOT MEET THESE CRITERIA, YOU ARE CONSIDERED LOW RISK FOR COVID-19.  What to do if you are HIGH RISK for COVID-19?  Marland Kitchen If you are having a medical emergency, call 911. . Seek medical care right away. Before you go to a doctor's office, urgent care or emergency department, .  call ahead and tell them about your recent travel, contact with someone  diagnosed with COVID-19  .  and your symptoms.  . You should receive instructions from your physician's office regarding next steps of care.  . When you arrive at healthcare provider, tell the healthcare staff immediately you have returned from  . visiting [place], [place], [place], [place] or [place]; or traveled in the [place] to [place], [place],  . [place] or [place] in the last two weeks or you have been in close contact with a person diagnosed with  . COVID-19 in the last 2 weeks.   . Tell the health care staff about your symptoms: fever, cough and shortness of breath. . After you have been seen by a medical provider, you will be either: o Tested for (COVID-19) and discharged home on quarantine except to seek medical care if  o symptoms worsen, and asked to  - Stay home and avoid contact with others until you get your results (4-5 days)  - Avoid travel on public transportation if possible (such as bus, train, or airplane) or o Sent to the Emergency Department by EMS for evaluation, COVID-19 testing  and  o possible admission depending on your condition and test results.  What to do if you are LOW RISK for COVID-19?  Reduce your risk of any infection by using the same precautions used for avoiding the common cold or flu:  Marland Kitchen Wash your hands often with soap  and warm water for at least 20 seconds.  If soap and water are not readily available,  . use an alcohol-based hand sanitizer with at least 60% alcohol.  . If coughing or sneezing, cover your mouth and nose by coughing or sneezing into the elbow areas of your shirt or coat, .  into a tissue or into your sleeve (not your hands). . Avoid shaking hands with others and consider head nods or verbal greetings only. . Avoid touching your eyes, nose, or mouth with unwashed hands.  . Avoid close contact with people who are sick. . Avoid places or events with large numbers of people in one location, like concerts or sporting  events. . Carefully consider travel plans you have or are making. . If you are planning any travel outside or inside the [place], visit the CDC's Travelers' Health webpage for the latest health notices. . If you have some symptoms but not all symptoms, continue to monitor at home and seek medical attention  . if your symptoms worsen. . If you are having a medical emergency, call 911.   ++++++++++++++++++++++++++++++++ Recommend Adult Low Dose Aspirin or  coated  Aspirin 81 mg daily  To reduce risk of Colon Cancer 40 %,  Skin Cancer 26 % ,  Melanoma 46%  and  Pancreatic cancer 60% ++++++++++++++++++++++++++++++++ Vitamin D goal  is between 70-100.  Please make sure that you are taking your Vitamin D as directed.  It is very important as a natural anti-inflammatory  helping hair, skin, and nails, as well as reducing stroke and heart attack risk.  It helps your bones and helps with mood. It also decreases numerous cancer risks so please take it as directed.  Low Vit D is associated with a 200-300% higher risk for CANCER  and 200-300% higher risk for HEART   ATTACK  &  STROKE.   .....................................Marland Kitchen It is also associated with higher death rate at younger ages,  autoimmune diseases like Rheumatoid arthritis, Lupus, Multiple Sclerosis.    Also many other serious conditions, like depression, Alzheimer's Dementia, infertility, muscle aches, fatigue, fibromyalgia - just to name a few. ++++++++++++++++++++ Recommend the book "The END of DIETING" by Dr Excell Seltzer  & the book "The END of DIABETES " by Dr Excell Seltzer At [facility].com - get book & Audio CD's    Being diabetic has a  300% increased risk for heart attack, stroke, cancer, and alzheimer- type vascular dementia. It is very important that you work harder with diet by avoiding all foods that are white. Avoid white rice (brown & wild rice is OK), white potatoes (sweetpotatoes in moderation is OK), White bread or wheat bread  or anything made out of white flour like bagels, donuts, rolls, buns, biscuits, cakes, pastries, cookies, pizza crust, and pasta (made from white flour & egg whites) - vegetarian pasta or spinach or wheat pasta is OK. Multigrain breads like Arnold's or Pepper[employer], or multigrain sandwich thins or flatbreads.  Diet, exercise and weight loss can reverse and cure diabetes in the early stages.  Diet, exercise and weight loss is very important in the control and prevention of complications of diabetes which affects every system in your body, ie. Brain - dementia/stroke, eyes - glaucoma/blindness, heart - heart attack/heart failure, kidneys - dialysis, stomach - gastric paralysis, intestines - malabsorption, nerves - severe painful neuritis, circulation - gangrene & loss of a leg(s), and finally cancer and Alzheimers.    I recommend avoid fried & greasy foods,  sweets/candy, white rice (brown or wild rice or Quinoa is OK), white potatoes (sweet potatoes are OK) - anything made from white flour - bagels, doughnuts, rolls, buns, biscuits,white and wheat breads, pizza crust and traditional pasta made of white flour & egg white(vegetarian pasta or spinach or wheat pasta is OK).  Multi-grain bread is OK - like multi-grain flat bread or sandwich thins. Avoid alcohol in excess. Exercise is also important.    Eat all the vegetables you want - avoid meat, especially red meat and dairy - especially cheese.  Cheese is the most concentrated form of trans-fats which is the worst thing to clog up our arteries. Veggie cheese is OK which can be found in the fresh produce section at [employer] or [employer] or [employer]  +++++++++++++++++++++ DASH Eating Plan  DASH stands for "Dietary Approaches to Stop Hypertension."   The DASH eating plan is a healthy eating plan that has been shown to reduce high blood pressure (hypertension). Additional health benefits may include reducing the risk of type 2 diabetes mellitus, heart  disease, and stroke. The DASH eating plan may also help with weight loss. WHAT DO I NEED TO KNOW ABOUT THE DASH EATING PLAN? For the DASH eating plan, you will follow these general guidelines:  Choose foods with a percent daily value for sodium of less than 5% (as listed on the food label).  Use salt-free seasonings or herbs instead of table salt or sea salt.  Check with your health care provider or pharmacist before using salt substitutes.  Eat lower-sodium products, often labeled as "lower sodium" or "no salt added."  Eat fresh foods.  Eat more vegetables, fruits, and low-fat dairy products.  Choose whole grains. Look for the word "whole" as the first word in the ingredient list.  Choose fish   Limit sweets, desserts, sugars, and sugary drinks.  Choose heart-healthy fats.  Eat veggie cheese   Eat more home-cooked food and less restaurant, buffet, and fast food.  Limit fried foods.  Cook foods using methods other than frying.  Limit canned vegetables. If you do use them, rinse them well to decrease the sodium.  When eating at a restaurant, ask that your food be prepared with less salt, or no salt if possible.                      WHAT FOODS CAN I EAT? Read Dr Fara Olden Fuhrman's books on The End of Dieting & The End of Diabetes  Grains Whole grain or whole wheat bread. Brown rice. Whole grain or whole wheat pasta. Quinoa, bulgur, and whole grain cereals. Low-sodium cereals. Corn or whole wheat flour tortillas. Whole grain cornbread. Whole grain crackers. Low-sodium crackers.  Vegetables Fresh or frozen vegetables (raw, steamed, roasted, or grilled). Low-sodium or reduced-sodium tomato and vegetable juices. Low-sodium or reduced-sodium tomato sauce and paste. Low-sodium or reduced-sodium canned vegetables.   Fruits All fresh, canned (in natural juice), or frozen fruits.  Protein Products  All fish and seafood.  Dried beans, peas, or lentils. Unsalted nuts and seeds.  Unsalted canned beans.  Dairy Low-fat dairy products, such as skim or 1% milk, 2% or reduced-fat cheeses, low-fat ricotta or cottage cheese, or plain low-fat yogurt. Low-sodium or reduced-sodium cheeses.  Fats and Oils Tub margarines without trans fats. Light or reduced-fat mayonnaise and salad dressings (reduced sodium). Avocado. Safflower, olive, or canola oils. Natural peanut or almond butter.  Other Unsalted popcorn and pretzels. The items listed above may not be  a complete list of recommended foods or beverages. Contact your dietitian for more options.  +++++++++++++++  WHAT FOODS ARE NOT RECOMMENDED? Grains/ White flour or wheat flour White bread. White pasta. White rice. Refined cornbread. Bagels and croissants. Crackers that contain trans fat.  Vegetables  Creamed or fried vegetables. Vegetables in a . Regular canned vegetables. Regular canned tomato sauce and paste. Regular tomato and vegetable juices.  Fruits Dried fruits. Canned fruit in light or heavy syrup. Fruit juice.  Meat and Other Protein Products Meat in general - RED meat & White meat.  Fatty cuts of meat. Ribs, chicken wings, all processed meats as bacon, sausage, bologna, salami, fatback, hot dogs, bratwurst and packaged luncheon meats.  Dairy Whole or 2% milk, cream, half-and-half, and cream cheese. Whole-fat or sweetened yogurt. Full-fat cheeses or blue cheese. Non-dairy creamers and whipped toppings. Processed cheese, cheese spreads, or cheese curds.  Condiments Onion and garlic salt, seasoned salt, table salt, and sea salt. Canned and packaged gravies. Worcestershire sauce. Tartar sauce. Barbecue sauce. Teriyaki sauce. Soy sauce, including reduced sodium. Steak sauce. Fish sauce. Oyster sauce. Cocktail sauce. Horseradish. Ketchup and mustard. Meat flavorings and tenderizers. Bouillon cubes. Hot sauce. Tabasco sauce. Marinades. Taco seasonings. Relishes.  Fats and Oils Butter, stick margarine, lard,  shortening and bacon fat. Coconut, palm kernel, or palm oils. Regular salad dressings.  Pickles and olives. Salted popcorn and pretzels.  The items listed above may not be a complete list of foods and beverages to avoid.

## 2020-07-26 ENCOUNTER — Other Ambulatory Visit: Payer: Self-pay | Admitting: Internal Medicine

## 2020-08-07 ENCOUNTER — Other Ambulatory Visit: Payer: Self-pay | Admitting: Internal Medicine

## 2020-08-07 DIAGNOSIS — E782 Mixed hyperlipidemia: Secondary | ICD-10-CM

## 2020-08-07 MED ORDER — ROSUVASTATIN CALCIUM 20 MG PO TABS: ORAL_TABLET | ORAL | 0 refills | Status: DC

## 2020-08-20 ENCOUNTER — Ambulatory Visit (INDEPENDENT_AMBULATORY_CARE_PROVIDER_SITE_OTHER): Payer: 59 | Admitting: Adult Health

## 2020-08-20 ENCOUNTER — Encounter: Payer: Self-pay | Admitting: Adult Health

## 2020-08-20 VITALS — BP 166/98 | HR 72 | Ht 62.0 in | Wt 160.0 lb

## 2020-08-20 DIAGNOSIS — I671 Cerebral aneurysm, nonruptured: Secondary | ICD-10-CM | POA: Diagnosis not present

## 2020-08-20 DIAGNOSIS — I639 Cerebral infarction, unspecified: Secondary | ICD-10-CM | POA: Diagnosis not present

## 2020-08-20 DIAGNOSIS — I6381 Other cerebral infarction due to occlusion or stenosis of small artery: Secondary | ICD-10-CM

## 2020-08-20 NOTE — Progress Notes (Signed)
[facility] [address] [place]. [place] [address] [phone]       HOSPITAL FOLLOW UP NOTE  [name] Date of Birth:  Jan 09, 1961 Medical Record Number:  [id number]   Reason for Referral:  hospital stroke follow up    SUBJECTIVE:   CHIEF COMPLAINT:  Chief Complaint  Patient presents with  . Follow-up    TR alone Pt is well, having numbness and tingling on R side.     HPI:   [name] is a 60 y.o. female with history of hypertension, anxiety, and asthma, who presented to [facility] ED on 07/13/2020 for evaluation of sudden onset of right-sided numbness.  Personally reviewed hospitalization pertinent progress notes, lab work and imaging.  Evaluated by Dr. Leonie Man with stroke work-up revealing acute left thalamic infarct likely due to small vessel disease.  CTA head/neck unremarkable except for incidental finding of 9mm right supraclinoid ICA aneurysm.  Recommended DAPT for 3 weeks then aspirin alone.  LDL 76.  A1c 5.3.  Other stroke risk factors include HTN, EtOH use, tobacco use and substance abuse (THC).  Evaluated by therapies and discharged home in stable condition without therapy needs and residual right hemibody paresthesias.  Stroke: acute left thalamic infarct likely due to small vessel disease.   Resultant right hemibody paresthesias   code Stroke CT Head - not ordered  CT head - No acute intracranial findings. Chronic microvascular ischemic change and cerebral volume loss.   MRI head - Acute left thalamic infarct. Moderate chronic small vessel ischemic disease.   MRA head - not ordered  CTA H&N - No large vessel occlusion or significant stenosis in the head and neck. 2 mm right supraclinoid ICA aneurysm. Known acute left thalamic infarct.   2D Echo -normal ejection fraction without cardiac source of embolism.  LDL - 76  HgbA1c - 5.3  UDS - THCU  VTE prophylaxis - Lovenox  No antithrombotic prior to admission, now  on aspirin 81 mg daily and clopidogrel 75 mg daily for 3 weeks then aspirin alone  Patient will be counseled to be compliant with her antithrombotic medications  Ongoing aggressive stroke risk factor management  Therapy recommendations: None   Disposition: Home  Today, 08/20/2020, she is being seen for hospital follow up. Reports residual right sided arm and leg numbness/tingling sensation as well as tightness sensation. Gradual improvement since discharge. Denies specific weakness. She does report burning sensation with increased ambulation. PCP rx'd gabapentin 100mg  1-2 cap TID without much benefit.  Denies new stroke/TIA symptoms.  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  Completed 3 weeks DAPT and remains on aspirin 81mg  daily without side effects. On Crestor 20mg  daily without myalgias. Blood pressure today 166/98. Is planning on starting to monitor at home and has follow-up scheduled PCP 3/9 for follow-up evaluation.  Denies continued tobacco, excessive EtOH or substance abuse.     ROS:   14 system review of systems performed and negative with exception of those listed in HPI  PMH:  Past Medical History:  Diagnosis Date  . Allergy   . Anemia   . Anxiety   . Asthma   . Hypertension     PSH: History reviewed. No pertinent surgical history.  Social History:  Social History   Socioeconomic History  . Marital status: Married    Spouse name: Not on file  . Number of children: Not on file  . Years of education: Not on file  .  Highest education level: Not on file  Occupational History  . Not on file  Tobacco Use  . Smoking status: Never Smoker  . Smokeless tobacco: Never Used  Vaping Use  . Vaping Use: Never used  Substance and Sexual Activity  . Alcohol use: Yes    Alcohol/week: 14.0 standard drinks    Types: 14 Standard drinks or equivalent per week    Comment: daily  . Drug use: Yes    Types: Marijuana  . Sexual activity:  Not on file  Other Topics Concern  . Not on file  Social History Narrative  . Not on file   Social Determinants of Health   Financial Resource Strain: Not on file  Food Insecurity: Not on file  Transportation Needs: Not on file  Physical Activity: Not on file  Stress: Not on file  Social Connections: Not on file  Intimate Partner Violence: Not on file    Family History:  Family History  Problem Relation Age of Onset  . Hypertension Mother   . Diabetes Mother   . Thyroid disease Mother   . Cataracts Mother   . Cirrhosis Mother        fatty liver  . Diabetes Father   . Hypertension Father   . Heart disease Father        bypass  . Dementia Father        LEWY BODY    Medications:   Current Outpatient Medications on File Prior to Visit  Medication Sig Dispense Refill  . acyclovir (ZOVIRAX) 400 MG tablet TAKE 1 TABLET 2 X /DAY FOR FEVER BLISTERS (Patient taking differently: Take 400 mg by mouth 2 (two) times daily as needed (For fever blisters).) 180 tablet 3  . allopurinol (ZYLOPRIM) 300 MG tablet TAKE 1/2 TO 1 TAB BY MOUTH EVERY DAY TO PREVENT GOUT (Patient taking differently: Take 150 mg by mouth daily.) 30 tablet 8  . Ascorbic Acid (VITAMIN C PO) Take 1 tablet by mouth daily.    Marland Kitchen aspirin EC 81 MG EC tablet Take 1 tablet (81 mg total) by mouth daily. Swallow whole. 30 tablet 11  . atenolol (TENORMIN) 100 MG tablet TAKE 1 TABLET BY MOUTH  DAILY FOR BLOOD PRESSURE (Patient taking differently: Take 100 mg by mouth daily.) 90 tablet 3  . cholecalciferol (VITAMIN D) 1000 units tablet Take 1,000 Units by mouth daily.    . clobetasol (TEMOVATE) 0.05 % external solution Apply 1 application topically 2 (two) times daily. 50 mL 3  . furosemide (LASIX) 40 MG tablet TAKE 1 TABLET BY MOUTH  DAILY FOR BLOOD PRESSURE  AND FLUID RETENTION / ANKLE SWELLING (Patient taking differently: Take 40 mg by mouth daily.) 90 tablet 3  . gabapentin (NEURONTIN) 100 MG capsule Take   1 to 2 capsules      3 x /day    for Muscle Spasms 90 capsule 2  . Insulin Pen Needle (NOVOFINE) 32G X 6 MM MISC Needs daily for saxenda 90 each 2  . MAGNESIUM PO Take 1,600 mg by mouth daily.    . milk thistle 175 MG tablet Take 175 mg by mouth daily.    . montelukast (SINGULAIR) 10 MG tablet Take    1 tablet    Daily      for Allergies (Patient taking differently: Take 10 mg by mouth at bedtime.) 90 tablet 0  . Omega-3 Fatty Acids (FISH OIL) 1000 MG CAPS Take 1,000 mg by mouth daily.    . pantoprazole (PROTONIX) 40  MG tablet Take 1 tablet Daily for Indigestion & Acid Reflux 90 tablet 0  . rosuvastatin (CRESTOR) 20 MG tablet Take  1 tablet Daily  for Cholesterol 90 tablet 0  . telmisartan (MICARDIS) 80 MG tablet TAKE 1 TABLET DAILY FOR  BLOOD PRESSURE (Patient taking differently: Take 80 mg by mouth daily.) 90 tablet 3  . traZODone (DESYREL) 100 MG tablet TAKE 1/2 TO 1 TABLET BY MOUTH 1 HOUR BEFORE BEDTIME IF NEEDED FOR SLEEP (Patient taking differently: Take 100 mg by mouth at bedtime as needed for sleep.) 90 tablet 0   No current facility-administered medications on file prior to visit.    Allergies:   Allergies  Allergen Reactions  . Ciprofloxacin Nausea Only    Headache  . Penicillins     Allergy has been more than 62yrs but unsure of what reaction was      OBJECTIVE:  Physical Exam  Vitals:   08/20/20 1305  BP: (!) 166/98  Pulse: 72  Weight: 160 lb (72.6 kg)  Height: 5\' 2"  (1.575 m)   Body mass index is 29.26 kg/m. No exam data present  General: well developed, well nourished, pleasant middle-age Caucasian female, seated, in no evident distress Head: head normocephalic and atraumatic.   Neck: supple with no carotid or supraclavicular bruits Cardiovascular: regular rate and rhythm, no murmurs Musculoskeletal: no deformity Skin:  no rash/petichiae Vascular:  Normal pulses all extremities   Neurologic Exam Mental Status: Awake and fully alert.   Fluent speech and language.  Oriented to  place and time. Recent and remote memory intact. Attention span, concentration and fund of knowledge appropriate. Mood and affect appropriate.  Cranial Nerves: Fundoscopic exam reveals sharp disc margins. Pupils equal, briskly reactive to light. Extraocular movements full without nystagmus. Visual fields full to confrontation. Hearing intact. Facial sensation intact. Face, tongue, palate moves normally and symmetrically.  Motor: Normal bulk and tone. Normal strength in all tested extremity muscles slightly decreased right hand dexterity Sensory.: intact to touch , pinprick , position and vibratory sensation.  Coordination: Rapid alternating movements normal in all extremities except slightly decreased right hand Dexterity. Finger-to-nose and heel-to-shin performed accurately except mild incoordination RUE and RLE Gait and Station: Arises from chair without difficulty. Stance is normal. Gait demonstrates normal stride length and balance without use of assistive device Reflexes: 1+ and symmetric. Toes downgoing.     NIHSS  1 (subjective numbness) Modified Rankin  1-2      ASSESSMENT: Valerie Hobbs is a 60 y.o. year old female presented with sudden onset of right-sided numbness on 07/13/2020 with stroke work-up revealing left thalamic infarct secondary to small vessel disease. Vascular risk factors include HTN, HLD, tobacco use, EtOH use and substance abuse.      PLAN:  1. L thalamic stroke :  a. Residual deficit: right hemisensory impairment.  Provided additional information regarding exercises to do at home and possible participation with outpatient therapy if indicated in the future b. Continue aspirin 81 mg daily  and Crestor 20 mg daily for secondary stroke prevention.   c. Discussed secondary stroke prevention measures and importance of close PCP follow up for aggressive stroke risk factor management  d. HTN: BP goal <130/90.  Uncontrolled on current regimen.  Encouraged  monitoring at home and to follow-up with PCP in the near future for further evaluation e. HLD: LDL goal <70. Recnt LDL 76 - initated atorvastatin 40mg  daily during recent stroke admission.  2. R ICA aneurysm: CTA head reported 2 mm right  supraclinoid ICA aneurysm.  Recommend repeating in 6 months for surveillance monitoring.  No prior history or family history of aneurysms    Follow up in 3 months or call earlier if needed   CC:  [place] provider: Dr. Armanda Magic, Gwyndolyn Saxon, MD    I spent 45 minutes of face-to-face and non-face-to-face time with patient.  This included previsit chart review including recent hospitalization pertinent progress notes, lab work and imaging, lab review, study review, order entry, electronic health record documentation, patient education regarding recent stroke including etiology, residual deficits, importance of managing stroke risk factors, incidental finding of right ICA aneurysm and answered all other questions to patient satisfaction   Frann Rider, AGNP-BC  [facility] [address] [place], [place] [address]  Phone [phone] Fax [phone] Note: This document was prepared with digital dictation and possible smart phrase technology. Any transcriptional errors that result from this process are unintentional.

## 2020-08-20 NOTE — Patient Instructions (Signed)
Do exercises at home as provided today for hopeful ongoing recovery - if you would like to do therapies please let me know  Continue aspirin 81 mg daily  and Crestor  for secondary stroke prevention  Continue to follow up with PCP regarding cholesterol and blood pressure management  Maintain strict control of hypertension with blood pressure goal below 130/90 and cholesterol with LDL cholesterol (bad cholesterol) goal below 70 mg/dL.      Followup in the future with me in 3 months or call earlier if needed     Thank you for coming to see [place] at [facility]. I hope we have been able to provide you high quality care today.  You may receive a patient satisfaction survey over the next few weeks. We would appreciate your feedback and comments so that we may continue to improve ourselves and the health of our patients.     Stroke Prevention Some medical conditions and lifestyle choices can lead to a higher risk for a stroke. You can help to prevent a stroke by eating healthy foods and exercising. It also helps to not smoke and to manage any health problems you may have. How can this condition affect me? A stroke is an emergency. It should be treated right away. A stroke can lead to brain damage or threaten your life. There is a better chance of surviving and getting better after a stroke if you get medical help right away. What can increase my risk? The following medical conditions may increase your risk of a stroke:  Diseases of the heart and blood vessels (cardiovascular disease).  High blood pressure (hypertension).  Diabetes.  High cholesterol.  Sickle cell disease.  Problems with blood clotting.  Being very overweight.  Sleeping problems (obstructivesleep apnea). Other risk factors include:  Being older than age 31.  A history of blood clots, stroke, or mini-stroke (TIA).  Race, ethnic background, or a family history of stroke.  Smoking or using  tobacco products.  Taking birth control pills, especially if you smoke.  Heavy alcohol and drug use.  Not being active. What actions can I take to prevent this? Manage your health conditions  High cholesterol. ? Eat a healthy diet. If this is not enough to manage your cholesterol, you may need to take medicines. ? Take medicines as told by your doctor.  High blood pressure. ? Try to keep your blood pressure below 130/80. ? If your blood pressure cannot be managed through a healthy diet and regular exercise, you may need to take medicines. ? Take medicines as told by your doctor. ? Ask your doctor if you should check your blood pressure at home. ? Have your blood pressure checked every year.  Diabetes. ? Eat a healthy diet and get regular exercise. If your blood sugar (glucose) cannot be managed through diet and exercise, you may need to take medicines. ? Take medicines as told by your doctor.  Talk to your doctor about getting checked for sleeping problems. Signs of a problem can include: ? Snoring a lot. ? Feeling very tired.  Make sure that you manage any other conditions you have. Nutrition  Follow instructions from your doctor about what to eat or drink. You may be told to: ? Eat and drink fewer calories each day. ? Limit how much salt (sodium) you use to 1,500 milligrams (mg) each day. ? Use only healthy fats for cooking, such as olive oil, canola oil, and sunflower oil. ? Eat healthy foods.  To do this:  Choose foods that are high in fiber. These include whole grains, and fresh fruits and vegetables.  Eat at least 5 servings of fruits and vegetables a day. Try to fill one-half of your plate with fruits and vegetables at each meal.  Choose low-fat (lean) proteins. These include low-fat cuts of meat, chicken without skin, fish, tofu, beans, and nuts.  Eat low-fat dairy products. ? Avoid foods that:  Are high in salt.  Have saturated fat.  Have trans fat.  Have  cholesterol.  Are processed or pre-made. ? Count how many carbohydrates you eat and drink each day.   Lifestyle  If you drink alcohol: ? Limit how much you have to:  0-1 drink a day for women who are not pregnant.  0-2 drinks a day for men. ? Know how much alcohol is in your drink. In the [place] one drink equals one 12 oz bottle of beer (329mL), one 5 oz glass of wine (178mL), or one 1 oz glass of hard liquor (43mL).  Do not smoke or use any products that have nicotine or tobacco. If you need help quitting, ask your doctor.  Avoid secondhand smoke.  Do not use drugs. Activity  Try to stay at a healthy weight.  Get at least 30 minutes of exercise on most days, such as: ? Fast walking. ? Biking. ? Swimming.   Medicines  Take over-the-counter and prescription medicines only as told by your doctor.  Avoid taking birth control pills. Talk to your doctor about the risks of taking birth control pills if: ? You are over 67 years old. ? You smoke. ? You get very bad headaches. ? You have had a blood clot. Where to find more information  American Stroke Association: www.strokeassociation.org Get help right away if:  You or a loved one has any signs of a stroke. "BE FAST" is an easy way to remember the warning signs: ? B - Balance. Dizziness, sudden trouble walking, or loss of balance. ? E - Eyes. Trouble seeing or a change in how you see. ? F - Face. Sudden weakness or loss of feeling of the face. The face or eyelid may droop on one side. ? A - Arms. Weakness or loss of feeling in an arm. This happens all of a sudden and most often on one side of the body. ? S - Speech. Sudden trouble speaking, slurred speech, or trouble understanding what people say. ? T - Time. Time to call emergency services. Write down what time symptoms started.  You or a loved one has other signs of a stroke, such as: ? A sudden, very bad headache with no known cause. ? Feeling like you may vomit  (nausea). ? Vomiting. ? A seizure. These symptoms may be an emergency. Get help right away. Call your local emergency services (911 in the [place]  Do not wait to see if the symptoms will go away.  Do not drive yourself to the hospital. Summary  You can help to prevent a stroke by eating healthy, exercising, and not smoking. It also helps to manage any health problems you have.  Do not smoke or use any products that contain nicotine or tobacco.  Get help right away if you or a loved one has any signs of a stroke. This information is not intended to replace advice given to you by your health care provider. Make sure you discuss any questions you have with your health care provider. Document Revised: 01/16/2020  Document Reviewed: 01/16/2020 Elsevier Patient Education  [place].

## 2020-08-21 ENCOUNTER — Other Ambulatory Visit: Payer: Self-pay | Admitting: Internal Medicine

## 2020-08-21 NOTE — Progress Notes (Signed)
I agree with the above plan 

## 2020-08-22 ENCOUNTER — Other Ambulatory Visit: Payer: Self-pay

## 2020-08-22 MED ORDER — TRAZODONE HCL 100 MG PO TABS: ORAL_TABLET | ORAL | 0 refills | Status: DC

## 2020-09-05 ENCOUNTER — Encounter: Payer: 59 | Admitting: Adult Health Nurse Practitioner

## 2020-09-05 NOTE — Progress Notes (Deleted)
Complete Physical  Assessment and Plan:  CPE Get PAP next year, given number to set up [facility]  Essential hypertension continue medications, DASH diet, exercise and monitor at home. Call if greater than 130/80. - CBC with Differential/Platelet - BASIC METABOLIC PANEL WITH GFR - Hepatic function panel  - TSH - Urinalysis, Routine w reflex microscopic (not at [facility]) - Microalbumin / creatinine urine ratio - EKG 12-Lead   Thalmic stroke (HCC) -Continue follow up with [facility]  Anemia, unspecified anemia type - , continue iron supp with Vitamin C and increase green leafy veggies   Asthma, unspecified asthma severity, uncomplicated Continue singulair, albuterol PRN, avoid triggers.   Allergy, subsequent encounter Continue OTC allergy pills  Hypokalemia - Magnesium   Hyperlipidemia - Lipid panel  Vitamin D deficiency - Vit D  25 hydroxy (rtn osteoporosis monitoring)  Anxiety -     Discontinue: buPROPion (WELLBUTRIN XL) 150 MG 24 hr tablet; Take 2 tablets (300 mg total) by mouth every morning.  Chronic gout without tophus, unspecified cause, unspecified site -     Uric acid  Obesity BMI 30 with HTN gout, chol Saxenda, has failed topamax, phentermine The requested drug is being used as an adjunct to lifestyle modification (e.g., dietary or caloric restriction, exercise, behavioral support, community based program). The patient has engaged in > 56months of lifestyle modification and failed to achieve desired weight loss. The patient has BMI greater than or equal to 30 kilograms / square meter with additional disorder related to weight (high cholesterol, hypertension, gout).    Further disposition pending results if labs check today. Discussed med's effects and SE's.   Over 30 minutes of face to face interview, exam, counseling, chart review, and critical decision making was performed.   Future Appointments  Date Time Provider [place]  09/05/2020  9:00 AM Garnet Sierras, NP  [facility] None  11/19/2020  1:15 PM Frann Rider, NP [facility] None  09/05/2021  9:00 AM Garnet Sierras, NP [facility] None    HPI 60 y.o. female  presents for a complete physical.  Patient was evaluated for Tham[place] stroke 3/57/01.  Presented with right sided numbness, MRI showed left thalamic CVA.  Cardio echo negative.  Patient starteed DAPT with LD bASA w/ plavix.   Her blood pressure has been controlled at home, she is on lasix 20, atenolol 100mg  and micardis 80mg  today their BP is   She does not workout, new building at work and has not been working out. She denies chest pain, shortness of breath, dizziness.   BMI is There is no height or weight on file to calculate BMI., she is not as active, she admits to emotional eating and ate more while working from home.  She has tried topamax, phentermine, and would like to try saxenda.  Wt Readings from Last 3 Encounters:  08/20/20 160 lb (72.6 kg)  07/24/20 162 lb 6.4 oz (73.7 kg)  07/13/20 160 lb (72.6 kg)   She is not on cholesterol medication, she is on benefiber and denies myalgias. Her cholesterol is not at goal. The cholesterol last visit was:   Lab Results  Component Value Date   CHOL 154 07/14/2020   HDL 45 07/14/2020   LDLCALC 76 07/14/2020   TRIG 163 (H) 07/14/2020   CHOLHDL 3.4 07/14/2020   Last A1C in the office was:  Lab Results  Component Value Date   HGBA1C 5.3 07/14/2020   Patient is on Vitamin D supplement, dose not changed.   Lab Results  Component  Value Date   VD25OH 5 06/03/2019   Patient is on allopurinol for gout and does not report a recent flare, she is on 1/2 of the allopurinol.   Lab Results  Component Value Date   LABURIC 3.7 02/22/2018   She is on a B complex Lab Results  Component Value Date   VITAMINB12 1,399 (H) 07/13/2020    Current Medications:  Current Outpatient Medications on File Prior to Visit  Medication Sig  . acyclovir (ZOVIRAX) 400 MG tablet TAKE 1 TABLET 2 X /DAY FOR  FEVER BLISTERS (Patient taking differently: Take 400 mg by mouth 2 (two) times daily as needed (For fever blisters).)  . allopurinol (ZYLOPRIM) 300 MG tablet TAKE 1/2 TO 1 TAB BY MOUTH EVERY DAY TO PREVENT GOUT (Patient taking differently: Take 150 mg by mouth daily.)  . Ascorbic Acid (VITAMIN C PO) Take 1 tablet by mouth daily.  Marland Kitchen aspirin EC 81 MG EC tablet Take 1 tablet (81 mg total) by mouth daily. Swallow whole.  Marland Kitchen atenolol (TENORMIN) 100 MG tablet TAKE 1 TABLET BY MOUTH  DAILY FOR BLOOD PRESSURE (Patient taking differently: Take 100 mg by mouth daily.)  . cholecalciferol (VITAMIN D) 1000 units tablet Take 1,000 Units by mouth daily.  . clobetasol (TEMOVATE) 0.05 % external solution Apply 1 application topically 2 (two) times daily.  . furosemide (LASIX) 40 MG tablet TAKE 1 TABLET BY MOUTH  DAILY FOR BLOOD PRESSURE  AND FLUID RETENTION / ANKLE SWELLING (Patient taking differently: Take 40 mg by mouth daily.)  . gabapentin (NEURONTIN) 100 MG capsule Take   1 to 2 capsules     3 x /day    for Muscle Spasms  . Insulin Pen Needle (NOVOFINE) 32G X 6 MM MISC Needs daily for saxenda  . MAGNESIUM PO Take 1,600 mg by mouth daily.  . milk thistle 175 MG tablet Take 175 mg by mouth daily.  . montelukast (SINGULAIR) 10 MG tablet TAKE 1 TABLET BY MOUTH  DAILY FOR ALLERGIES  . Omega-3 Fatty Acids (FISH OIL) 1000 MG CAPS Take 1,000 mg by mouth daily.  . pantoprazole (PROTONIX) 40 MG tablet Take 1 tablet Daily for Indigestion & Acid Reflux  . rosuvastatin (CRESTOR) 20 MG tablet Take  1 tablet Daily  for Cholesterol  . telmisartan (MICARDIS) 80 MG tablet TAKE 1 TABLET DAILY FOR  BLOOD PRESSURE (Patient taking differently: Take 80 mg by mouth daily.)  . traZODone (DESYREL) 100 MG tablet Take  1/2 to 1 tablet  1 hour before Bedtime  as needed for Sleep   No current facility-administered medications on file prior to visit.    Patient Active Problem List   Diagnosis Date Noted  . Thalamic stroke ([place])  07/13/2020  . Gout 12/12/2015  . Hyperlipidemia 12/12/2015  . Vitamin D deficiency 12/12/2015  . Hypokalemia 07/15/2013  . Hypertension   . Allergy   . Anemia   . Anxiety   . Asthma     Health Maintenance:   Immunization History  Administered Date(s) Administered  . Influenza-Unspecified 04/09/2016, 04/18/2017  . Pneumococcal Polysaccharide-23 06/30/1996  . Tdap 11/16/2012   Tetanus: 2014 Pneumovax: 1998 Prevnar 13: declines Flu vaccine: 2020 at office Zostavax: N/A  Pap: 2015 Negative due 5 years, never abnormal test- declines this visit MGM: 04/2017 OVER DUE given number DEXA: N/A Colonoscopy: 2011 due 2021 EGD: N/A  Allergies Allergies  Allergen Reactions  . Ciprofloxacin Nausea Only    Headache  . Penicillins     Allergy has been  more than 51yrs but unsure of what reaction was    SURGICAL HISTORY She  has no past surgical history on file. FAMILY HISTORY Her family history includes Cataracts in her mother; Cirrhosis in her mother; Dementia in her father; Diabetes in her father and mother; Heart disease in her father; Hypertension in her father and mother; Thyroid disease in her mother. SOCIAL HISTORY She  reports that she has never smoked. She has never used smokeless tobacco. She reports current alcohol use of about 14.0 standard drinks of alcohol per week. She reports current drug use. Drug: Marijuana. Will drink 2 glasses of wine a day.   Review of Systems  Constitutional: Negative.   HENT: Negative.   Eyes: Negative.   Respiratory: Negative.   Cardiovascular: Negative.   Gastrointestinal: Negative.   Genitourinary: Negative.   Musculoskeletal: Negative for back pain, falls, joint pain, myalgias and neck pain.  Skin: Negative.   Neurological: Negative.   Endo/Heme/Allergies: Negative.   Psychiatric/Behavioral: Negative for depression, hallucinations, memory loss, substance abuse and suicidal ideas. The patient is not nervous/anxious and does not have  insomnia.      Physical Exam: Estimated body mass index is 29.26 kg/m as calculated from the following:   Height as of 08/20/20: 5\' 2"  (1.575 m).   Weight as of 08/20/20: 160 lb (72.6 kg). There were no vitals taken for this visit. General Appearance: Well nourished, in no apparent distress. Eyes: PERRLA, EOMs, conjunctiva no swelling or erythema, normal fundi and vessels. Sinuses: No Frontal/maxillary tenderness ENT/Mouth: Ext aud canals clear, normal light reflex with TMs without erythema, bulging.  Good dentition. No erythema, swelling, or exudate on post pharynx. Tonsils not swollen or erythematous. Hearing normal.  Neck: Supple, thyroid normal. No bruits Respiratory: Respiratory effort normal, BS equal bilaterally without rales, rhonchi, wheezing or stridor. Cardio: RRR without murmurs, rubs or gallops. Brisk peripheral pulses without edema.  Chest: symmetric, with normal excursions and percussion. Breasts: Symmetric, without lumps, nipple discharge, retractions. Abdomen: Soft, +BS. Non tender, no guarding, rebound, hernias, masses, or organomegaly. .  Lymphatics: Non tender without lymphadenopathy.  Genitourinary: defer Musculoskeletal: Full ROM all peripheral extremities,5/5 strength, and normal gait. Skin: Warm, dry without rashes, lesions, ecchymosis.  Neuro: Cranial nerves intact, reflexes equal bilaterally. Normal muscle tone, no cerebellar symptoms. Sensation intact.  Psych: Awake and oriented X 3, normal affect, Insight and Judgment appropriate.   EKG: WNL no changes. AORTA SCAN: N/A   Brannan Cassedy 8:32 AM

## 2020-09-13 ENCOUNTER — Other Ambulatory Visit: Payer: Self-pay | Admitting: Internal Medicine

## 2020-09-13 DIAGNOSIS — M5430 Sciatica, unspecified side: Secondary | ICD-10-CM

## 2020-09-13 MED ORDER — GABAPENTIN 300 MG PO CAPS: ORAL_CAPSULE | ORAL | 0 refills | Status: DC

## 2020-09-14 ENCOUNTER — Encounter: Payer: Self-pay | Admitting: Adult Health

## 2020-09-14 DIAGNOSIS — E663 Overweight: Secondary | ICD-10-CM | POA: Insufficient documentation

## 2020-09-14 DIAGNOSIS — K219 Gastro-esophageal reflux disease without esophagitis: Secondary | ICD-10-CM | POA: Insufficient documentation

## 2020-09-14 DIAGNOSIS — Z8673 Personal history of transient ischemic attack (TIA), and cerebral infarction without residual deficits: Secondary | ICD-10-CM | POA: Insufficient documentation

## 2020-09-14 NOTE — Progress Notes (Signed)
Complete Physical  Assessment and Plan:  CPE Declined PAP today - schedule with [facility] Continue annual mammogram   Essential hypertension continue medications, DASH diet, exercise and monitor at home. Call if greater than 130/80. - CBC with Differential/Platelet - CMP/GFR - TSH - Urinalysis, Routine w reflex microscopic (not at [facility]) - Microalbumin / creatinine urine ratio - EKG 12-Lead- defer, had numerous with recent admission   Anemia, unspecified anemia type - macrocytic, recent normal B12, check folate   , continue iron supp with Vitamin C and increase green leafy veggies   Asthma, unspecified asthma severity, uncomplicated Continue singulair, albuterol PRN, avoid triggers.   Allergy, subsequent encounter Continue OTC allergy pills   Hyperlipidemia LDL goal <70;  Continue low cholesterol diet and exercise.  Check lipid panel.  - Lipid panel  Vitamin D deficiency - Vit D  25 hydroxy (rtn osteoporosis monitoring)  Anxiety Reports mild; limited to driving; declined med; discussed counseling Stress management techniques discussed, increase water, good sleep hygiene discussed, increase exercise, and increase veggies.   GERD Well managed on current medications; consider PPI taper next OV Discussed diet, avoiding triggers and other lifestyle changes  Chronic gout without tophus, unspecified cause, unspecified site Continue allopurinol Diet discussed -     Uric acid  History of thalamic CVA Completed 3 weeks of DAPT; now on aggressive lipid therapy and bASA Neuro following; planning 6 month follow up for tiny aneurysm  Control BP;   Overweight - BMI 25 Long discussion about weight loss, diet, and exercise Recommended diet heavy in fruits and veggies and low in animal meats, cheeses, and dairy products, appropriate calorie intake Discussed appropriate weight for height Follow up at next visit   Orders Placed This Encounter  Procedures  . CBC with  Differential/Platelet  . COMPLETE METABOLIC PANEL WITH GFR  . Magnesium  . Lipid panel  . TSH  . VITAMIN D 25 Hydroxy (Vit-D Deficiency, Fractures)  . Microalbumin / creatinine urine ratio  . Urinalysis, Routine w reflex microscopic  . Folate RBC  . Uric acid  . Ambulatory referral to Gastroenterology     Discussed med's effects and SE's. Screening labs and tests as requested with regular follow-up as recommended. Future Appointments  Date Time Provider [place]  11/19/2020  1:15 PM Frann Rider, NP [facility] None  03/21/2021  9:30 AM Liane Comber, NP [facility] None  09/17/2021  9:00 AM Liane Comber, NP [facility] None    HPI 60 y.o. female  presents for a complete physical. She has Hypertension; Environmental allergies; Anemia; Anxiety; Asthma; Gout; Hyperlipidemia; Vitamin D deficiency; History of ischemic vertebrobasilar artery thalamic stroke; Overweight (BMI 25.0-29.9); and GERD (gastroesophageal reflux disease) on their problem list.   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   Recently in Jan 2022 she presented with a 2 day hx/o total body right-sided numbness/paresthesias. Head CT scan found only chronic microvascular changes, but Brain MRI showed an acute Left Thalamic CVA and 2 mm right supraclinoid ICA aneurysm. Cardiac echo was negative. Patient was begun on DAPT with bASA /Plavix with recommendations for stopping the Plavix after 3 weeks. She was recommended aggressive lipid control. Has seen neuro follow up, planning 6 month follow up for aneurysm.   Asthma/allergies on singulaire and well controlled.   GERD on protonix with fully controlled sx;   BMI is Body mass index is 28.73 kg/m., she has been working on diet, trying  to avoid eating late at night, admits minimal exercise but planning to increase walking now that weather is improved.  Wt Readings from Last 3 Encounters:   09/17/20 159 lb 9.6 oz (72.4 kg)  08/20/20 160 lb (72.6 kg)  07/24/20 162 lb 6.4 oz (73.7 kg)   Her blood pressure has been controlled at home, today their BP is BP: 136/72 She does not workout, new building at work and has not been working out. She denies chest pain, shortness of breath, dizziness.   She is on cholesterol medication, recently on rosuvastatin 20 mg daily (new). Her cholesterol is not at goal of LDL <70. The cholesterol last visit was:   Lab Results  Component Value Date   CHOL 154 07/14/2020   HDL 45 07/14/2020   LDLCALC 76 07/14/2020   TRIG 163 (H) 07/14/2020   CHOLHDL 3.4 07/14/2020   Last A1C in the office was:  Lab Results  Component Value Date   HGBA1C 5.3 07/14/2020   Patient is on Vitamin D supplement, taking  Lab Results  Component Value Date   VD25OH 50 06/03/2019   Patient is on allopurinol for gout and does not report a recent flare, she is on 1/2 of the allopurinol.   Lab Results  Component Value Date   LABURIC 3.7 02/22/2018   Recently macrocytic/hyperchromic without anemia on CBC;  is on a B complex, recent check:  Lab Results  Component Value Date   VITAMINB12 1,399 (H) 07/13/2020      Current Medications:  Current Outpatient Medications on File Prior to Visit  Medication Sig  . acyclovir (ZOVIRAX) 400 MG tablet TAKE 1 TABLET 2 X /DAY FOR FEVER BLISTERS (Patient taking differently: Take 400 mg by mouth 2 (two) times daily as needed (For fever blisters).)  . allopurinol (ZYLOPRIM) 300 MG tablet TAKE 1/2 TO 1 TAB BY MOUTH EVERY DAY TO PREVENT GOUT (Patient taking differently: Take 150 mg by mouth daily.)  . Ascorbic Acid (VITAMIN C PO) Take 1 tablet by mouth daily.  Marland Kitchen aspirin EC 81 MG EC tablet Take 1 tablet (81 mg total) by mouth daily. Swallow whole.  Marland Kitchen atenolol (TENORMIN) 100 MG tablet TAKE 1 TABLET BY MOUTH  DAILY FOR BLOOD PRESSURE (Patient taking differently: Take 100 mg by mouth daily.)  . cholecalciferol (VITAMIN D) 1000 units  tablet Take 1,000 Units by mouth daily.  . furosemide (LASIX) 40 MG tablet TAKE 1 TABLET BY MOUTH  DAILY FOR BLOOD PRESSURE  AND FLUID RETENTION / ANKLE SWELLING (Patient taking differently: Take 40 mg by mouth daily.)  . gabapentin (NEURONTIN) 300 MG capsule Take  1 capsule  3 x /day  for Muscle Spasms  . MAGNESIUM PO Take 1,600 mg by mouth daily.  . milk thistle 175 MG tablet Take 175 mg by mouth daily.  . montelukast (SINGULAIR) 10 MG tablet TAKE 1 TABLET BY MOUTH  DAILY FOR ALLERGIES  . Omega-3 Fatty Acids (FISH OIL) 1000 MG CAPS Take 1,000 mg by mouth daily.  . pantoprazole (PROTONIX) 40 MG tablet Take 1 tablet Daily for Indigestion & Acid Reflux  . rosuvastatin (CRESTOR) 20 MG tablet Take  1 tablet Daily  for Cholesterol  . telmisartan (MICARDIS) 80 MG tablet TAKE 1 TABLET DAILY FOR  BLOOD PRESSURE (Patient taking differently: Take 80 mg by mouth daily.)  . traZODone (DESYREL) 100 MG tablet Take  1/2 to 1 tablet  1 hour before Bedtime  as needed for Sleep  . clobetasol (TEMOVATE) 0.05 % external solution  Apply 1 application topically 2 (two) times daily. (Patient not taking: Reported on 09/17/2020)  . Insulin Pen Needle (NOVOFINE) 32G X 6 MM MISC Needs daily for saxenda (Patient not taking: Reported on 09/17/2020)   No current facility-administered medications on file prior to visit.    Patient Active Problem List   Diagnosis Date Noted  . History of ischemic vertebrobasilar artery thalamic stroke 09/14/2020  . Overweight (BMI 25.0-29.9) 09/14/2020  . GERD (gastroesophageal reflux disease) 09/14/2020  . Gout 12/12/2015  . Hyperlipidemia 12/12/2015  . Vitamin D deficiency 12/12/2015  . Hypertension   . Environmental allergies   . Anemia   . Anxiety   . Asthma     Health Maintenance:   Immunization History  Administered Date(s) Administered  . Influenza-Unspecified 04/09/2016, 04/18/2017  . Janssen (J&J) SARS-COV-2 Vaccination 11/21/2019  . Pneumococcal Polysaccharide-23  06/30/1996  . Tdap 11/16/2012   Tetanus: 2014 Pneumovax: 1998 Prevnar 13: declines Flu vaccine: 2020 at office, declines  Shingrix: check with insurance  Covid 19: J&J  Pap: 2015 Negative due 5 years, never abnormal test- declines this visit, SCHEDULE WITH NEXT OV MGM: 12/2019, patient will  DEXA: get age 11  Colonoscopy: 2011 due 2021 ? Dr. Shary Key, 5 year recall due to poly abstracted - referral placed to GI EGD: N/A  Last eye: remote, encouraged  Last dental: last 2021, goes q50m, Dr. Gae Bon Last derm: Robyne Askew, [place], 2021, will follow up PRN  Allergies Allergies  Allergen Reactions  . Ciprofloxacin Nausea Only    Headache  . Penicillins     Allergy has been more than 90yrs but unsure of what reaction was    SURGICAL HISTORY She  has a past surgical history that includes No past surgeries. FAMILY HISTORY Her family history includes Cancer in her paternal grandmother; Cataracts in her mother; Cirrhosis in her mother; Dementia in her father and maternal grandmother; Diabetes in her father and mother; Heart disease in her father and maternal grandmother; Hypertension in her father and mother; Thyroid disease in her mother. SOCIAL HISTORY She  reports that she has never smoked. She has never used smokeless tobacco. She reports current alcohol use of about 14.0 standard drinks of alcohol per week. She reports previous drug use. Drug: Marijuana.   Review of Systems  Constitutional: Negative for malaise/fatigue and weight loss.  HENT: Negative for hearing loss and tinnitus.   Eyes: Negative for blurred vision and double vision.  Respiratory: Negative for cough, shortness of breath and wheezing.   Cardiovascular: Negative for chest pain, palpitations, orthopnea, claudication and leg swelling.  Gastrointestinal: Negative for abdominal pain, blood in stool, constipation, diarrhea, heartburn, melena, nausea and vomiting.  Genitourinary: Negative.   Musculoskeletal: Negative  for joint pain and myalgias.  Skin: Negative for rash.  Neurological: Positive for tingling (R upper and lower extremities) and sensory change. Negative for dizziness, tremors, seizures, weakness and headaches.  Endo/Heme/Allergies: Negative for polydipsia.  Psychiatric/Behavioral: Negative for depression, substance abuse and suicidal ideas. The patient is nervous/anxious (mild, when driving). The patient does not have insomnia.   All other systems reviewed and are negative.   Physical Exam: Estimated body mass index is 28.73 kg/m as calculated from the following:   Height as of this encounter: 5' 2.5" (1.588 m).   Weight as of this encounter: 159 lb 9.6 oz (72.4 kg). BP 136/72   Pulse 71   Temp (!) 96.6 F (35.9 C)   Ht 5' 2.5" (1.588 m)   Wt 159 lb 9.6 oz (  72.4 kg)   LMP 08/14/2018   SpO2 97%   BMI 28.73 kg/m  General Appearance: Well nourished, in no apparent distress. Eyes: PERRLA, EOMs, conjunctiva no swelling or erythema Sinuses: No Frontal/maxillary tenderness ENT/Mouth: Ext aud canals clear, normal light reflex with TMs without erythema, bulging.  Good dentition. No erythema, swelling, or exudate on post pharynx. Tonsils not swollen or erythematous. Hearing normal.  Neck: Supple, thyroid normal. No bruits Respiratory: Respiratory effort normal, BS equal bilaterally without rales, rhonchi, wheezing or stridor. Cardio: RRR without murmurs, rubs or gallops. Brisk peripheral pulses without edema.  Chest: symmetric, with normal excursions and percussion. Breasts: getting mammograms, no concerns, defer Abdomen: Soft, +BS. Non tender, no guarding, rebound, hernias, masses, or organomegaly.  Lymphatics: Non tender without lymphadenopathy.  Genitourinary: DECLINES Musculoskeletal: Full ROM all peripheral extremities,5/5 strength, and normal gait. Skin: Warm, dry without rashes, lesions, ecchymosis.  Neuro: Cranial nerves intact, reflexes equal bilaterally. Normal muscle tone, no  cerebellar symptoms. Sensation diminished RUE to monofilament, RLE absent to monofilament; intact to pressure.  Psych: Awake and oriented X 3, normal affect, Insight and Judgment appropriate.   EKG: defer- numerous recent with admission, no concerns   Valerie Hobbs 1:06 PM

## 2020-09-17 ENCOUNTER — Other Ambulatory Visit: Payer: Self-pay

## 2020-09-17 ENCOUNTER — Ambulatory Visit (INDEPENDENT_AMBULATORY_CARE_PROVIDER_SITE_OTHER): Payer: 59 | Admitting: Adult Health

## 2020-09-17 ENCOUNTER — Encounter: Payer: Self-pay | Admitting: Adult Health

## 2020-09-17 VITALS — BP 136/72 | HR 71 | Temp 96.6°F | Ht 62.5 in | Wt 159.6 lb

## 2020-09-17 DIAGNOSIS — I10 Essential (primary) hypertension: Secondary | ICD-10-CM

## 2020-09-17 DIAGNOSIS — E785 Hyperlipidemia, unspecified: Secondary | ICD-10-CM

## 2020-09-17 DIAGNOSIS — Z0000 Encounter for general adult medical examination without abnormal findings: Secondary | ICD-10-CM | POA: Diagnosis not present

## 2020-09-17 DIAGNOSIS — F419 Anxiety disorder, unspecified: Secondary | ICD-10-CM

## 2020-09-17 DIAGNOSIS — M1A9XX0 Chronic gout, unspecified, without tophus (tophi): Secondary | ICD-10-CM

## 2020-09-17 DIAGNOSIS — E663 Overweight: Secondary | ICD-10-CM

## 2020-09-17 DIAGNOSIS — Z9109 Other allergy status, other than to drugs and biological substances: Secondary | ICD-10-CM

## 2020-09-17 DIAGNOSIS — D649 Anemia, unspecified: Secondary | ICD-10-CM

## 2020-09-17 DIAGNOSIS — Z1211 Encounter for screening for malignant neoplasm of colon: Secondary | ICD-10-CM

## 2020-09-17 DIAGNOSIS — Z8673 Personal history of transient ischemic attack (TIA), and cerebral infarction without residual deficits: Secondary | ICD-10-CM

## 2020-09-17 DIAGNOSIS — J45909 Unspecified asthma, uncomplicated: Secondary | ICD-10-CM

## 2020-09-17 DIAGNOSIS — E559 Vitamin D deficiency, unspecified: Secondary | ICD-10-CM

## 2020-09-17 DIAGNOSIS — K219 Gastro-esophageal reflux disease without esophagitis: Secondary | ICD-10-CM

## 2020-09-17 NOTE — Patient Instructions (Addendum)
Valerie Hobbs , Thank you for taking time to come for your Medicare Wellness Visit. I appreciate your ongoing commitment to your health goals. Please review the following plan we discussed and let me know if I can assist you in the future.   These are the goals we discussed: Goals    . DIET - EAT MORE FRUITS AND VEGETABLES    . Exercise 150 min/wk Moderate Activity     Start with 10-15 min of walking daily, gradually increase       This is a list of the screening recommended for you and due dates:  Health Maintenance  Topic Date Due  . Pap Smear  11/29/2016  . Colon Cancer Screening  12/22/2019  . COVID-19 Vaccine (2 - Booster for Janssen series) 01/16/2020  . Flu Shot  01/29/2020  . Mammogram  01/04/2022  . Tetanus Vaccine  11/17/2022  .  Hepatitis C: One time screening is recommended by Center for Disease Control  (CDC) for  adults born from [birth year] through 1966.   Completed  . HIV Screening  Completed  . HPV Vaccine  Aged Out     Ask insurance about shingrix - can get at [facility] or [facility]  Try to reduce alcohol intake - limit to 1/day for women    Know what a healthy weight is for you (roughly BMI <25) and aim to maintain this  Aim for 7+ servings of fruits and vegetables daily  65-80+ fluid ounces of water or unsweet tea for healthy kidneys  Limit to max 1 drink of alcohol per day; avoid smoking/tobacco  Limit animal fats in diet for cholesterol and heart health - choose grass fed whenever available  Avoid highly processed foods, and foods high in saturated/trans fats  Aim for low stress - take time to unwind and care for your mental health  Aim for 150 min of moderate intensity exercise weekly for heart health, and weights twice weekly for bone health  Aim for 7-9 hours of sleep daily   A great goal to work towards is aiming to get in a serving daily of some of the most nutritionally dense foods - G- BOMBS daily        Antiinflammatory diet  Doctors  are learning that one of the best ways to reduce inflammation lies not in the medicine cabinet, but in the refrigerator.  By following an anti-inflammatory diet you can fight off inflammation for good.  What does an anti-inflammatory diet do? Your immune system becomes activated when your body recognizes anything that is foreign--such as an invading microbe, plant pollen, or chemical. This often triggers a process called inflammation. Intermittent bouts of inflammation directed at truly threatening invaders protect your health.  However, sometimes inflammation persists, day in and day out, even when you are not threatened by a foreign invader. That's when inflammation can become your enemy. Many major diseases that plague us--including cancer, heart disease, diabetes, arthritis, depression, and Alzheimer's--have been linked to chronic inflammation.  One of the most powerful tools to combat inflammation comes not from the pharmacy, but from the grocery store.   Protect yourself from the damage of chronic inflammation. Science has proven that chronic, low-grade inflammation can turn into a silent killer that contributes to cardiovascular disease, cancer, type 2 diabetes and other conditions.   Choose the right anti-inflammatory foods, and you may be able to reduce your risk of illness. Consistently pick the wrong ones, and you could accelerate the inflammatory disease process.  Foods that cause inflammation Try to avoid or limit these foods as much as possible:   refined carbohydrates, such as white bread and pastries  [place] fries and other fried foods  soda and other sugar-sweetened beverages  red meat (burgers, steaks) and processed meat (hot dogs, sausage)  margarine, shortening, and lard  The health risks of inflammatory foods Not surprisingly, the same foods on an inflammation diet are generally considered bad for our health, including sodas and refined carbohydrates, as well as  red meat and processed meats.  "Some of the foods that have been associated with an increased risk for chronic diseases such as type 2 diabetes and heart disease are also associated with excess inflammation," Dr. Melanee Spry says. "It's not surprising, since inflammation is an important underlying mechanism for the development of these diseases."  Unhealthy foods also contribute to weight gain, which is itself a risk factor for inflammation. Yet in several studies, even after researchers took obesity into account, the link between foods and inflammation remained, which suggests weight gain isn't the sole driver. "Some of the food components or ingredients may have independent effects on inflammation over and above increased caloric intake," Dr. Melanee Spry says.  Anti-inflammatory foods An anti-inflammatory diet should include these foods:   tomatoes  olive oil  green leafy vegetables, such as spinach, kale, and collards  nuts like almonds and walnuts  fatty fish like salmon, mackerel, tuna, and sardines  fruits such as strawberries, blueberries, cherries, and oranges  Benefits of anti-inflammatory foods On the flip side are beverages and foods that reduce inflammation, in particular fruits and vegetables such as blueberries, apples, and leafy greens that are high in natural antioxidants and polyphenols--protective compounds found in plants.  Studies have also associated nuts with reduced markers of inflammation and a lower risk of cardiovascular disease and diabetes. Coffee, which contains polyphenols and other anti-inflammatory compounds, may protect against inflammation, as well.  Anti-inflammatory diet To reduce levels of inflammation, aim for an overall healthy diet. If you're looking for an eating plan that closely follows the tenets of anti-inflammatory eating, consider the Mediterranean diet, which is high in fruits, vegetables, nuts, whole grains, fish, and healthy oils.  In addition to lowering  inflammation, a more natural, less processed diet can have noticeable effects on your physical and emotional health and overall quality of life.   [email or web address].fi         >

## 2020-09-18 LAB — CBC WITH DIFFERENTIAL/PLATELET
Absolute Monocytes: 768 cells/uL (ref 200–950)
Basophils Absolute: 124 cells/uL (ref 0–200)
Basophils Relative: 1.1 %
Eosinophils Absolute: 384 cells/uL (ref 15–500)
Eosinophils Relative: 3.4 %
HCT: 41.4 % (ref 35.0–45.0)
Hemoglobin: 14.2 g/dL (ref 11.7–15.5)
Lymphs Abs: 2226 cells/uL (ref 850–3900)
MCH: 33.6 pg — ABNORMAL HIGH (ref 27.0–33.0)
MCHC: 34.3 g/dL (ref 32.0–36.0)
MCV: 97.9 fL (ref 80.0–100.0)
MPV: 10.9 fL (ref 7.5–12.5)
Monocytes Relative: 6.8 %
Neutro Abs: 7797 cells/uL (ref 1500–7800)
Neutrophils Relative %: 69 %
Platelets: 259 10*3/uL (ref 140–400)
RBC: 4.23 10*6/uL (ref 3.80–5.10)
RDW: 12.8 % (ref 11.0–15.0)
Total Lymphocyte: 19.7 %
WBC: 11.3 10*3/uL — ABNORMAL HIGH (ref 3.8–10.8)

## 2020-09-18 LAB — COMPLETE METABOLIC PANEL WITH GFR
AG Ratio: 1.7 (calc) (ref 1.0–2.5)
ALT: 40 U/L — ABNORMAL HIGH (ref 6–29)
AST: 54 U/L — ABNORMAL HIGH (ref 10–35)
Albumin: 4.6 g/dL (ref 3.6–5.1)
Alkaline phosphatase (APISO): 92 U/L (ref 37–153)
BUN: 21 mg/dL (ref 7–25)
CO2: 26 mmol/L (ref 20–32)
Calcium: 9.9 mg/dL (ref 8.6–10.4)
Chloride: 100 mmol/L (ref 98–110)
Creat: 0.99 mg/dL (ref 0.50–0.99)
GFR, Est African American: 72 mL/min/{1.73_m2} (ref >=60)
GFR, Est Non African American: 62 mL/min/{1.73_m2} (ref >=60)
Globulin: 2.7 g/dL (calc) (ref 1.9–3.7)
Glucose, Bld: 85 mg/dL (ref 65–99)
Potassium: 3.5 mmol/L (ref 3.5–5.3)
Sodium: 144 mmol/L (ref 135–146)
Total Bilirubin: 0.4 mg/dL (ref 0.2–1.2)
Total Protein: 7.3 g/dL (ref 6.1–8.1)

## 2020-09-18 LAB — LIPID PANEL
Cholesterol: 173 mg/dL (ref <200)
HDL: 88 mg/dL (ref >=50)
LDL Cholesterol (Calc): 60 mg/dL (calc)
Non-HDL Cholesterol (Calc): 85 mg/dL (calc) (ref <130)
Total CHOL/HDL Ratio: 2 (calc) (ref <5.0)
Triglycerides: 170 mg/dL — ABNORMAL HIGH (ref <150)

## 2020-09-18 LAB — URINALYSIS, ROUTINE W REFLEX MICROSCOPIC
Bilirubin Urine: NEGATIVE
Glucose, UA: NEGATIVE
Hgb urine dipstick: NEGATIVE
Ketones, ur: NEGATIVE
Nitrite: NEGATIVE
Protein, ur: NEGATIVE
RBC / HPF: NONE SEEN /HPF (ref 0–2)
Specific Gravity, Urine: 1.009 (ref 1.001–1.03)
Squamous Epithelial / HPF: NONE SEEN /HPF (ref <=5)
WBC, UA: NONE SEEN /HPF (ref 0–5)
pH: 8 (ref 5.0–8.0)

## 2020-09-18 LAB — MICROALBUMIN / CREATININE URINE RATIO
Creatinine, Urine: 21 mg/dL (ref 20–275)
Microalb Creat Ratio: 48 mcg/mg creat — ABNORMAL HIGH (ref <30)
Microalb, Ur: 1 mg/dL

## 2020-09-18 LAB — URIC ACID: Uric Acid, Serum: 4.2 mg/dL (ref 2.5–7.0)

## 2020-09-18 LAB — MICROSCOPIC MESSAGE

## 2020-09-18 LAB — VITAMIN D 25 HYDROXY (VIT D DEFICIENCY, FRACTURES): Vit D, 25-Hydroxy: 51 ng/mL (ref 30–100)

## 2020-09-18 LAB — TSH: TSH: 0.59 mIU/L (ref 0.40–4.50)

## 2020-09-18 LAB — MAGNESIUM: Magnesium: 2.5 mg/dL (ref 1.5–2.5)

## 2020-09-19 ENCOUNTER — Other Ambulatory Visit: Payer: Self-pay

## 2020-09-19 MED ORDER — TRAZODONE HCL 100 MG PO TABS: ORAL_TABLET | ORAL | 3 refills | Status: DC

## 2020-09-30 ENCOUNTER — Other Ambulatory Visit: Payer: Self-pay | Admitting: Internal Medicine

## 2020-10-02 ENCOUNTER — Other Ambulatory Visit: Payer: Self-pay | Admitting: Internal Medicine

## 2020-10-02 MED ORDER — GABAPENTIN 600 MG PO TABS: ORAL_TABLET | ORAL | 1 refills | Status: DC

## 2020-10-10 ENCOUNTER — Other Ambulatory Visit: Payer: Self-pay | Admitting: Internal Medicine

## 2020-10-10 MED ORDER — ATENOLOL 100 MG PO TABS: ORAL_TABLET | ORAL | 3 refills | Status: DC

## 2020-10-15 ENCOUNTER — Ambulatory Visit (INDEPENDENT_AMBULATORY_CARE_PROVIDER_SITE_OTHER): Payer: 59 | Admitting: Internal Medicine

## 2020-10-15 ENCOUNTER — Telehealth: Payer: Self-pay | Admitting: *Deleted

## 2020-10-15 ENCOUNTER — Other Ambulatory Visit: Payer: Self-pay

## 2020-10-15 ENCOUNTER — Encounter: Payer: Self-pay | Admitting: Internal Medicine

## 2020-10-15 VITALS — BP 140/86 | HR 64 | Temp 97.3°F | Resp 16 | Ht 62.5 in | Wt 160.4 lb

## 2020-10-15 DIAGNOSIS — M25512 Pain in left shoulder: Secondary | ICD-10-CM | POA: Diagnosis not present

## 2020-10-15 MED ORDER — DEXAMETHASONE 4 MG PO TABS
ORAL_TABLET | ORAL | 0 refills | Status: DC
Start: 2020-10-15 — End: 2020-11-19

## 2020-10-15 NOTE — Patient Instructions (Signed)
Shoulder Pain Many things can cause shoulder pain, including:  An injury to the shoulder.  Overuse of the shoulder.  Arthritis. The source of the pain can be:  Inflammation.  An injury to the shoulder joint.  An injury to a tendon, ligament, or bone. Follow these instructions at home: Pay attention to changes in your symptoms. Let your health care provider know about them. Follow these instructions to relieve your pain. If you have a sling:  Wear the sling as told by your health care provider. Remove it only as told by your health care provider.  Loosen the sling if your fingers tingle, become numb, or turn cold and blue.  Keep the sling clean.  If the sling is not waterproof: ? Do not let it get wet. Remove it to shower or bathe.  Move your arm as little as possible, but keep your hand moving to prevent swelling. Managing pain, stiffness, and swelling  If directed, put ice on the painful area: ? Put ice in a plastic bag. ? Place a towel between your skin and the bag. ? Leave the ice on for 20 minutes, 2-3 times per day. Stop applying ice if it does not help with the pain.  Squeeze a soft ball or a foam pad as much as possible. This helps to keep the shoulder from swelling. It also helps to strengthen the arm.   General instructions  Take over-the-counter and prescription medicines only as told by your health care provider.  Keep all follow-up visits as told by your health care provider. This is important. Contact a health care provider if:  Your pain gets worse.  Your pain is not relieved with medicines.  New pain develops in your arm, hand, or fingers. Get help right away if:  Your arm, hand, or fingers: ? Tingle. ? Become numb. ? Become swollen. ? Become painful. ? Turn white or blue. Summary  Shoulder pain can be caused by an injury, overuse, or arthritis.  Pay attention to changes in your symptoms. Let your health care provider know about  them.  This condition may be treated with a sling, ice, and pain medicines.  Contact your health care provider if the pain gets worse or new pain develops. Get help right away if your arm, hand, or fingers tingle or become numb, swollen, or painful.  Keep all follow-up visits as told by your health care provider. This is important. This information is not intended to replace advice given to you by your health care provider. Make sure you discuss any questions you have with your health care provider. Document Revised: 12/29/2017 Document Reviewed: 12/29/2017 Elsevier Patient Education  2021 [employer]  

## 2020-10-15 NOTE — Telephone Encounter (Signed)
Spoke with patient about scheduling an appointment at [facility] for her colonoscopy. Patient states she has not been feeling well, but plans to call and schedule her colonoscopy in June of July.

## 2020-10-15 NOTE — Progress Notes (Signed)
     History of Present Illness:      Patient is a very nice 60 yo MWF who presents with a 2 week hx/o f Left shoulder pain and secondary weakness.  She denies and hx/o trauma.   Medications   .  atenolol  100 MG tablet, Take  1 tablet  Daily for BP .  furosemide  40 MG tablet, Take 40 mg by mouth 2 (two) times daily. .  rosuvastatin  20 MG tablet, Take  1 tablet Daily  for Cholesterol .  telmisartan  80 MG tablet, TAKE 1 TABLET DAILY  .  montelukast  10 MG tablet, TAKE 1 TABLET B  DAILY FOR ALLERGIES .  allopurinol (ZYLOPRIM) 300 MG tablet, TAKE 1/2 TAB EVERY DAY  .  aspirin EC 81 MG EC , Take 1 tablet  daily.  Marland Kitchen  acyclovir 400 MG , TAKE 1 TABLET 2 X /DAY FOR FEVER BLISTERS  as needed .  VITAMIN C , Take 1 tablet daily. Marland Kitchen  VITAMIN D 1000 units tablet, Take 1,000 Units  daily. .  TEMOVATE 0.05 % ext  soln, Apply 1 application topically 2  times daily. Marland Kitchen  gabapentin  600 MG tablet, Take  1/2 to 1 tablet  2 to 3 x /Daily  as needed for Pain .  MAGNESIUM , Take 1,600 mg  daily. .  milk thistle 175 MG, Take 175 mg  daily. .  Omega-3 FISH OIL 1000 MG , Take 1,000 mg  daily. .  Pantoprazole 40 MG tablet, Take 1 tablet Daily  .  traZODone  100 MG tablet, Take 1/2 to 1 tablet 1 hour before bedtime as needed for sleep.  Problem list She has Hypertension; Environmental allergies; Anemia; Anxiety; Asthma; Gout; Hyperlipidemia; Vitamin D deficiency; History of ischemic vertebrobasilar artery thalamic stroke; Overweight (BMI 25.0-29.9); and GERD (gastroesophageal reflux disease) on their problem list.   Observations/Objective:   BP 140/86   P 64   T  97.3 F    R 16   Ht 5' 2.5"   Wt 160 lb    SpO2 98%   BMI 28.87   HEENT - WNL. Neck - supple.  Chest - Clear equal BS. Cor - Nl HS. RRR w/o sig MGR. PP 1(+). No edema. MS- FROM w/o deformities.  Tenderness along the Left shoulder anterior joint line and pain with abduction and external rotation. Gait Nl. Neuro -  Nl w/o focal  abnormalities.  Assessment and Plan:  1. Acute pain of left shoulder  - dexamethasone  4 MG tablet;  Take 1 tab 3 x day - 3 days, then 2 x day - 3 days, then 1 tab daily  Disp: 20 tablet  - Advised call in 4 -5 days if not significantly better for Ortho referral.  She has established at [facility] in the past.  Follow Up Instructions:        I discussed the assessment and treatment plan with the patient. The patient was provided an opportunity to ask questions and all were answered. The patient agreed with the plan and demonstrated an understanding of the instructions.       The patient was advised to call back or seek an in-person evaluation if the symptoms worsen or if the condition fails to improve as anticipated.    Kirtland Bouchard, MD

## 2020-10-16 ENCOUNTER — Other Ambulatory Visit: Payer: Self-pay | Admitting: Adult Health

## 2020-10-16 DIAGNOSIS — Z79899 Other long term (current) drug therapy: Secondary | ICD-10-CM

## 2020-10-16 DIAGNOSIS — I10 Essential (primary) hypertension: Secondary | ICD-10-CM

## 2020-10-22 ENCOUNTER — Other Ambulatory Visit: Payer: Self-pay | Admitting: Internal Medicine

## 2020-10-22 DIAGNOSIS — M25512 Pain in left shoulder: Secondary | ICD-10-CM

## 2020-10-23 ENCOUNTER — Other Ambulatory Visit: Payer: Self-pay | Admitting: Internal Medicine

## 2020-10-23 MED ORDER — TELMISARTAN 80 MG PO TABS: ORAL_TABLET | ORAL | 3 refills | Status: DC

## 2020-10-29 ENCOUNTER — Other Ambulatory Visit: Payer: Self-pay | Admitting: Internal Medicine

## 2020-10-29 DIAGNOSIS — E782 Mixed hyperlipidemia: Secondary | ICD-10-CM

## 2020-11-05 ENCOUNTER — Other Ambulatory Visit: Payer: Self-pay | Admitting: Internal Medicine

## 2020-11-05 DIAGNOSIS — I10 Essential (primary) hypertension: Secondary | ICD-10-CM

## 2020-11-05 MED ORDER — FUROSEMIDE 40 MG PO TABS: ORAL_TABLET | ORAL | 1 refills | Status: DC

## 2020-11-08 ENCOUNTER — Other Ambulatory Visit: Payer: Self-pay

## 2020-11-08 DIAGNOSIS — I10 Essential (primary) hypertension: Secondary | ICD-10-CM

## 2020-11-08 MED ORDER — FUROSEMIDE 40 MG PO TABS: ORAL_TABLET | ORAL | 1 refills | Status: DC

## 2020-11-08 NOTE — Progress Notes (Signed)
Rx request for Furosemide be sent to mail order pharmacy

## 2020-11-13 ENCOUNTER — Other Ambulatory Visit: Payer: Self-pay

## 2020-11-13 ENCOUNTER — Ambulatory Visit: Payer: 59

## 2020-11-13 DIAGNOSIS — I10 Essential (primary) hypertension: Secondary | ICD-10-CM

## 2020-11-13 DIAGNOSIS — Z79899 Other long term (current) drug therapy: Secondary | ICD-10-CM

## 2020-11-14 ENCOUNTER — Other Ambulatory Visit: Payer: Self-pay | Admitting: Adult Health

## 2020-11-14 LAB — BASIC METABOLIC PANEL WITH GFR
BUN/Creatinine Ratio: 13 (calc) (ref 6–22)
BUN: 20 mg/dL (ref 7–25)
CO2: 31 mmol/L (ref 20–32)
Calcium: 9.6 mg/dL (ref 8.6–10.4)
Chloride: 98 mmol/L (ref 98–110)
Creat: 1.56 mg/dL — ABNORMAL HIGH (ref 0.50–0.99)
GFR, Est African American: 41 mL/min/{1.73_m2} — ABNORMAL LOW (ref >=60)
GFR, Est Non African American: 35 mL/min/{1.73_m2} — ABNORMAL LOW (ref >=60)
Glucose, Bld: 99 mg/dL (ref 65–99)
Potassium: 3.1 mmol/L — ABNORMAL LOW (ref 3.5–5.3)
Sodium: 140 mmol/L (ref 135–146)

## 2020-11-14 MED ORDER — AMLODIPINE BESYLATE 5 MG PO TABS: 5.0000 mg | ORAL_TABLET | Freq: Every day | ORAL | 1 refills | Status: DC

## 2020-11-14 NOTE — Progress Notes (Signed)
Spoke to patient, she reports taking  Lasix 40mg  2 tablets in am Telmisartan 80mg  1 tablet in pm Atneolol 100mg  1 tablet in am (per [facility] recommendation to take am instead of pm) 11/12/20 BP @ 11am, working from home= 132/89 11/13/20 BP @ 2pm,  while in our office, on her machine, she brought from home = BP 156/96  Advised patient your recommendation to decrease Lasix 40mg  1 in am. She understands and asks, "When is the best time for me to take my BP?" She states she "I get depressed taking my BP- it stays so high especially the bottom number" . She would like to know the best time of day to take her BP to get the most accurate reading, in relation to when she takes her medication.

## 2020-11-14 NOTE — Progress Notes (Signed)
Documented current dosing of medication. Patient given new instructions for current medication & fluid intake, advised new medicine sent to pharmacy, documented BP readings w/ patient questions sent to provider, and scheduled 10 day follow up.  All questions answered and patient states she will send provider a message.

## 2020-11-19 ENCOUNTER — Ambulatory Visit (INDEPENDENT_AMBULATORY_CARE_PROVIDER_SITE_OTHER): Payer: 59 | Admitting: Adult Health

## 2020-11-19 ENCOUNTER — Encounter: Payer: Self-pay | Admitting: Adult Health

## 2020-11-19 VITALS — BP 137/81 | HR 84 | Ht 62.0 in | Wt 164.0 lb

## 2020-11-19 DIAGNOSIS — I10 Essential (primary) hypertension: Secondary | ICD-10-CM | POA: Diagnosis not present

## 2020-11-19 DIAGNOSIS — G890 Central pain syndrome: Secondary | ICD-10-CM

## 2020-11-19 DIAGNOSIS — E785 Hyperlipidemia, unspecified: Secondary | ICD-10-CM

## 2020-11-19 DIAGNOSIS — I639 Cerebral infarction, unspecified: Secondary | ICD-10-CM | POA: Diagnosis not present

## 2020-11-19 DIAGNOSIS — I671 Cerebral aneurysm, nonruptured: Secondary | ICD-10-CM | POA: Diagnosis not present

## 2020-11-19 DIAGNOSIS — I6381 Other cerebral infarction due to occlusion or stenosis of small artery: Secondary | ICD-10-CM

## 2020-11-19 MED ORDER — GABAPENTIN 600 MG PO TABS: 900.0000 mg | ORAL_TABLET | Freq: Three times a day (TID) | ORAL | 3 refills | Status: DC

## 2020-11-19 NOTE — Patient Instructions (Signed)
Increase gabapentin dosage to 900mg  (1.5 tabs) 3 times daily - if difficulty tolerating morning and afternoon dose, can continue 600mg  dosage and then continue 900mg  nightly  May consider trialing other medications in the future if needed such as duloxetine or amitriptyline  Continue aspirin 81 mg daily  and Crestor for secondary stroke prevention  Continue to follow up with PCP regarding cholesterol and blood pressure management  Maintain strict control of hypertension with blood pressure goal below 130/90 and cholesterol with LDL cholesterol (bad cholesterol) goal below 70 mg/dL.   We will plan on repeating imaging of your head around July for surveillance monitoring of cerebral aneurysm      Followup in the future with me in 4 months or call earlier if needed       Thank you for coming to see [place] at [facility]. I hope we have been able to provide you high quality care today.  You may receive a patient satisfaction survey over the next few weeks. We would appreciate your feedback and comments so that we may continue to improve ourselves and the health of our patients.

## 2020-11-19 NOTE — Progress Notes (Signed)
[facility] [address] [place]. [place] [address] [phone]       HOSPITAL FOLLOW UP NOTE  [name] Date of Birth:  09/29/1960 Medical Record Number:  [id number]   Reason for Referral:  hospital stroke follow up    SUBJECTIVE:   CHIEF COMPLAINT:  Chief Complaint  Patient presents with  . Follow-up    TR alone Pt is still having pain/numbness/tingling/burning in R arm/leg. Arm has slightly improved but not leg.      HPI:   Today, 11/19/2020, Valerie Hobbs for 19-month stroke follow-up accompanied  She reports continued right arm and leg paresthesias with some improvement of her arm but not her leg.  She has continued on gabapentin 300-600mg  TID which has been beneficial  But continues to experience pain especially upon awakening in the morning.  She continues to work as a Statistician and feels like she would not be able to work without use of the gabapentin. Denies new stroke/TIA symptoms  Compliant on aspirin and Crestor without associated side effects Blood pressure today 137/81 -routinely monitors at home and typically 120s/80s Denies tobacco, EtOH or substance abuse  Currently being followed by Ortho for left shoulder subluxation and plans on starting PT 6/1 in hopes of avoiding surgical procedure  No further concerns at this time    History provided for reference purposes only  Initial visit 08/20/2020 JM: she is being seen for hospital follow up. Reports residual right sided arm and leg numbness/tingling sensation as well as tightness sensation. Gradual improvement since discharge. Denies specific weakness. She does report burning sensation with increased ambulation. PCP rx'd gabapentin 100mg  1-2 cap TID without much benefit.  Denies new stroke/TIA symptoms.  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  Completed 3 weeks DAPT and remains on aspirin 81mg  daily without side effects.  On Crestor 20mg  daily without myalgias. Blood pressure today 166/98. Is planning on starting to monitor at home and has follow-up scheduled PCP 3/9 for follow-up evaluation.  Denies continued tobacco, excessive EtOH or substance abuse.  Stroke admission 07/13/2020 Valerie Hobbs is a 60 y.o. female with history of hypertension, anxiety, and asthma, who presented to [facility] ED on 07/13/2020 for evaluation of sudden onset of right-sided numbness.  Personally reviewed hospitalization pertinent progress notes, lab work and imaging.  Evaluated by Dr. Leonie Man with stroke work-up revealing acute left thalamic infarct likely due to small vessel disease.  CTA head/neck unremarkable except for incidental finding of 32mm right supraclinoid ICA aneurysm.  Recommended DAPT for 3 weeks then aspirin alone.  LDL 76.  A1c 5.3.  Other stroke risk factors include HTN, EtOH use, tobacco use and substance abuse (THC).  Evaluated by therapies and discharged home in stable condition without therapy needs and residual right hemibody paresthesias.  Stroke: acute left thalamic infarct likely due to small vessel disease.   Resultant right hemibody paresthesias   code Stroke CT Head - not ordered  CT head - No acute intracranial findings. Chronic microvascular ischemic change and cerebral volume loss.   MRI head - Acute left thalamic infarct. Moderate chronic small vessel ischemic disease.   MRA head - not ordered  CTA H&N - No large vessel occlusion or significant stenosis in the head and neck. 2 mm right supraclinoid ICA aneurysm. Known acute left thalamic infarct.   2D Echo -normal ejection fraction without cardiac source of embolism.  LDL - 76  HgbA1c - 5.3  UDS -  THCU  VTE prophylaxis - Lovenox  No antithrombotic prior to admission, now on aspirin 81 mg daily and clopidogrel 75 mg daily for 3 weeks then aspirin alone  Patient will be counseled to be compliant with her antithrombotic medications  Ongoing  aggressive stroke risk factor management  Therapy recommendations: None   Disposition: Home     ROS:   14 system review of systems performed and negative with exception of those listed in HPI  PMH:  Past Medical History:  Diagnosis Date  . Allergy   . Anemia   . Anxiety   . Asthma   . Hypertension   . Thalamic stroke ([place]) 07/13/2020    PSH:  Past Surgical History:  Procedure Laterality Date  . NO PAST SURGERIES      Social History:  Social History   Socioeconomic History  . Marital status: Married    Spouse name: Not on file  . Number of children: Not on file  . Years of education: Not on file  . Highest education level: Not on file  Occupational History  . Not on file  Tobacco Use  . Smoking status: Never Smoker  . Smokeless tobacco: Never Used  Vaping Use  . Vaping Use: Never used  Substance and Sexual Activity  . Alcohol use: Yes    Alcohol/week: 14.0 standard drinks    Types: 14 Standard drinks or equivalent per week    Comment: daily  . Drug use: Not Currently    Types: Marijuana    Comment: Quit 06/2020  . Sexual activity: Yes    Partners: Male    Birth control/protection: Post-menopausal  Other Topics Concern  . Not on file  Social History Narrative  . Not on file   Social Determinants of Health   Financial Resource Strain: Not on file  Food Insecurity: Not on file  Transportation Needs: Not on file  Physical Activity: Not on file  Stress: Not on file  Social Connections: Not on file  Intimate Partner Violence: Not on file    Family History:  Family History  Problem Relation Age of Onset  . Hypertension Mother   . Diabetes Mother   . Thyroid disease Mother   . Cataracts Mother   . Cirrhosis Mother        fatty liver  . Diabetes Father   . Hypertension Father   . Heart disease Father        bypass  . Dementia Father        LEWY BODY  . Dementia Maternal Grandmother   . Heart disease Maternal Grandmother   . Cancer Paternal  Grandmother     Medications:   Current Outpatient Medications on File Prior to Visit  Medication Sig Dispense Refill  . acyclovir (ZOVIRAX) 400 MG tablet TAKE 1 TABLET 2 X /DAY FOR FEVER BLISTERS (Patient taking differently: Take 400 mg by mouth 2 (two) times daily as needed (For fever blisters).) 180 tablet 3  . allopurinol (ZYLOPRIM) 300 MG tablet TAKE 1/2 TO 1 TAB BY MOUTH EVERY DAY TO PREVENT GOUT (Patient taking differently: Take 150 mg by mouth daily.) 30 tablet 8  . amLODipine (NORVASC) 5 MG tablet Take 1 tablet (5 mg total) by mouth daily. As night for blood pressure goal <130/80. 90 tablet 1  . Ascorbic Acid (VITAMIN C PO) Take 1 tablet by mouth daily.    Marland Kitchen aspirin EC 81 MG EC tablet Take 1 tablet (81 mg total) by mouth daily. Swallow whole. 30 tablet  11  . atenolol (TENORMIN) 100 MG tablet Take  1 tablet  Daily for BP 90 tablet 3  . cholecalciferol (VITAMIN D) 1000 units tablet Take 1,000 Units by mouth daily.    . clobetasol (TEMOVATE) 0.05 % external solution Apply 1 application topically 2 (two) times daily. 50 mL 3  . furosemide (LASIX) 40 MG tablet Take  1 tablet  Daily  for BP  & Fluid Retention /Ankle Swelling 90 tablet 1  . gabapentin (NEURONTIN) 600 MG tablet Take  1/2 to 1 tablet  2 to 3 x /Daily  as needed for Pain 270 tablet 1  . MAGNESIUM PO Take 1,600 mg by mouth daily.    . milk thistle 175 MG tablet Take 175 mg by mouth daily.    . montelukast (SINGULAIR) 10 MG tablet TAKE 1 TABLET BY MOUTH  DAILY FOR ALLERGIES 90 tablet 3  . Omega-3 Fatty Acids (FISH OIL) 1000 MG CAPS Take 1,000 mg by mouth daily.    . pantoprazole (PROTONIX) 40 MG tablet Take 1 tablet Daily for Indigestion & Acid Reflux 90 tablet 0  . rosuvastatin (CRESTOR) 20 MG tablet TAKE 1 TABLET BY MOUTH EVERY DAY FOR CHOLESTEROL 90 tablet 1  . telmisartan (MICARDIS) 80 MG tablet TAKE 1 TABLET DAILY FOR  BLOOD PRESSURE 90 tablet 3  . traZODone (DESYREL) 100 MG tablet Take 1/2 to 1 tablet 1 hour before bedtime  as needed for sleep. 31 tablet 3   No current facility-administered medications on file prior to visit.    Allergies:   Allergies  Allergen Reactions  . Ciprofloxacin Nausea Only    Headache  . Penicillins     Allergy has been more than 67yrs but unsure of what reaction was      OBJECTIVE:  Physical Exam  Vitals:   11/19/20 1324  BP: 137/81  Pulse: 84  Weight: 164 lb (74.4 kg)  Height: 5\' 2"  (1.575 m)   Body mass index is 30 kg/m. No exam data present  General: well developed, well nourished, pleasant middle-age Caucasian female, seated, in no evident distress Head: head normocephalic and atraumatic.   Neck: supple with no carotid or supraclavicular bruits Cardiovascular: regular rate and rhythm, no murmurs Musculoskeletal: no deformity; limited left shoulder ROM Skin:  no rash/petichiae Vascular:  Normal pulses all extremities   Neurologic Exam Mental Status: Awake and fully alert.   Fluent speech and language.  Oriented to place and time. Recent and remote memory intact. Attention span, concentration and fund of knowledge appropriate. Mood and affect appropriate.  Cranial Nerves: Fundoscopic exam reveals sharp disc margins. Pupils equal, briskly reactive to light. Extraocular movements full without nystagmus. Visual fields full to confrontation. Hearing intact. Facial sensation intact. Face, tongue, palate moves normally and symmetrically.  Motor: Normal bulk and tone. Normal strength in all tested extremity muscles except difficulty fully testing LUE d/t decreased shoulder ROM Sensory.: intact to touch , pinprick , position and vibratory sensation except mild " scratchiness sensation" with light touch RUE and RLE Coordination: Rapid alternating movements normal in all extremities.  Finger-to-nose and heel-to-shin performed accurately bilaterally Gait and Station: Arises from chair without difficulty. Stance is normal. Gait demonstrates normal stride length and balance  without use of assistive device Reflexes: 1+ and symmetric. Toes downgoing.          ASSESSMENT: Valerie Hobbs is a 60 y.o. year old female presented with sudden onset of right-sided numbness on 07/13/2020 with stroke work-up revealing left thalamic infarct secondary to  small vessel disease. Vascular risk factors include HTN, HLD, tobacco use, EtOH use and substance abuse.      PLAN:  1. L thalamic stroke :  a. 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  Recommend increase gabapentin from 600mg  TID to 900mg  TID. Would consider use of duloxetine or amitriptyline in the future if needed.  b. Continue aspirin 81 mg daily  and Crestor 20 mg daily for secondary stroke prevention.   c. Discussed secondary stroke prevention measures and importance of close PCP follow up for aggressive stroke risk factor management  d. HTN: BP goal <130/90.  Well-controlled on current regimen per PCP e. HLD: LDL goal <70. Recent LDL 60 on atorvastatin 40mg  daily per PCP 2. R ICA aneurysm: CTA head reported 2 mm right supraclinoid ICA aneurysm. Plans on repeating CTA head around July for surveillance monitoring.  No prior history or family history of aneurysms    Follow up in 4 months or call earlier if needed   CC:  [place] provider: Dr. Armanda Magic, Gwyndolyn Saxon, MD     Frann Rider, [facility]  [facility] [address] [place], [place] [address]  Phone [phone] Fax [phone] Note: This document was prepared with digital dictation and possible smart phrase technology. Any transcriptional errors that result from this process are unintentional.

## 2020-11-19 NOTE — Progress Notes (Signed)
I agree with the above plan 

## 2020-11-20 ENCOUNTER — Other Ambulatory Visit: Payer: Self-pay | Admitting: Internal Medicine

## 2020-11-20 DIAGNOSIS — K2100 Gastro-esophageal reflux disease with esophagitis, without bleeding: Secondary | ICD-10-CM

## 2020-11-23 NOTE — Progress Notes (Deleted)
Assessment and Plan:  1. Uncomplicated asthma, unspecified asthma severity, unspecified whether persistent ***  2. History of ischemic vertebrobasilar artery thalamic stroke ***  3. AKI (acute kidney injury) ([place]) ***     Further disposition pending results of labs. Discussed med's effects and SE's.   Over 30 minutes of exam, counseling, chart review, and critical decision making was performed.   Future Appointments  Date Time Provider [place]  11/27/2020 10:00 AM Liane Comber, NP [facility] None  03/18/2021  1:15 PM Frann Rider, NP [facility] None  03/21/2021  9:30 AM Liane Comber, NP [facility] None  09/17/2021  9:00 AM Liane Comber, NP [facility] None    ------------------------------------------------------------------------------------------------------------------   HPI LMP 08/14/2018   60 y.o.female presents for follow up on htn and renal functions. Recently in Jan 2022 she presented with a 2 day hx/o total body right-sided numbness/paresthesias. Head CT scan found only chronic microvascular changes, but Brain MRI showed an acute Left Thalamic CVA and 2 mm right supraclinoid ICA aneurysm. She completed 3 weeks of DAPT and continues with plavix.   She was having persistent hypertension, persistently 140/80+, was taking atenolol 100 mg AM, telmisartan 80 mg PM, lasix was increased to 80 mg but on follow up had acute renal decline and advised to reduce back to 40 mg and amlodipine 5 mg was added.   Her blood pressure {HAS HAS NOT:18834} been controlled at home, today their BP is    She {DOES_DOES FTD:32202} workout. She denies chest pain, shortness of breath, dizziness.  Lab Results  Component Value Date   GFRNONAA 35 (L) 11/13/2020   GFRNONAA 62 09/17/2020   GFRNONAA >60 07/13/2020   Lab Results  Component Value Date   CREATININE 1.56 (H) 11/13/2020   CREATININE 0.99 09/17/2020   CREATININE 0.84 07/13/2020     Past Medical History:  Diagnosis  Date  . Allergy   . Anemia   . Anxiety   . Asthma   . Hypertension   . Thalamic stroke ([place]) 07/13/2020     Allergies  Allergen Reactions  . Ciprofloxacin Nausea Only    Headache  . Penicillins     Allergy has been more than 32yrs but unsure of what reaction was    Current Outpatient Medications on File Prior to Visit  Medication Sig  . acyclovir (ZOVIRAX) 400 MG tablet TAKE 1 TABLET 2 X /DAY FOR FEVER BLISTERS (Patient taking differently: Take 400 mg by mouth 2 (two) times daily as needed (For fever blisters).)  . allopurinol (ZYLOPRIM) 300 MG tablet TAKE 1/2 TO 1 TAB BY MOUTH EVERY DAY TO PREVENT GOUT (Patient taking differently: Take 150 mg by mouth daily.)  . amLODipine (NORVASC) 5 MG tablet Take 1 tablet (5 mg total) by mouth daily. As night for blood pressure goal <130/80.  . Ascorbic Acid (VITAMIN C PO) Take 1 tablet by mouth daily.  Marland Kitchen aspirin EC 81 MG EC tablet Take 1 tablet (81 mg total) by mouth daily. Swallow whole.  Marland Kitchen atenolol (TENORMIN) 100 MG tablet Take  1 tablet  Daily for BP  . cholecalciferol (VITAMIN D) 1000 units tablet Take 1,000 Units by mouth daily.  . clobetasol (TEMOVATE) 0.05 % external solution Apply 1 application topically 2 (two) times daily.  . furosemide (LASIX) 40 MG tablet Take  1 tablet  Daily  for BP  & Fluid Retention /Ankle Swelling  . gabapentin (NEURONTIN) 600 MG tablet Take 1.5 tablets (900 mg total) by mouth 3 (three) times daily.  Marland Kitchen MAGNESIUM  PO Take 1,600 mg by mouth daily.  . milk thistle 175 MG tablet Take 175 mg by mouth daily.  . montelukast (SINGULAIR) 10 MG tablet TAKE 1 TABLET BY MOUTH  DAILY FOR ALLERGIES  . Omega-3 Fatty Acids (FISH OIL) 1000 MG CAPS Take 1,000 mg by mouth daily.  . pantoprazole (PROTONIX) 40 MG tablet TAKE 1 TABLET BY MOUTH DAILY FOR INDIGESTION & ACID REFLUX  . rosuvastatin (CRESTOR) 20 MG tablet TAKE 1 TABLET BY MOUTH EVERY DAY FOR CHOLESTEROL  . telmisartan (MICARDIS) 80 MG tablet TAKE 1 TABLET DAILY FOR  BLOOD  PRESSURE  . traZODone (DESYREL) 100 MG tablet Take 1/2 to 1 tablet 1 hour before bedtime as needed for sleep.   No current facility-administered medications on file prior to visit.    ROS: all negative except above.   Physical Exam:  LMP 08/14/2018   General Appearance: Well nourished, in no apparent distress. Eyes: PERRLA, EOMs, conjunctiva no swelling or erythema Sinuses: No Frontal/maxillary tenderness ENT/Mouth: Ext aud canals clear, TMs without erythema, bulging. No erythema, swelling, or exudate on post pharynx.  Tonsils not swollen or erythematous. Hearing normal.  Neck: Supple, thyroid normal.  Respiratory: Respiratory effort normal, BS equal bilaterally without rales, rhonchi, wheezing or stridor.  Cardio: RRR with no MRGs. Brisk peripheral pulses without edema.  Abdomen: Soft, + BS.  Non tender, no guarding, rebound, hernias, masses. Lymphatics: Non tender without lymphadenopathy.  Musculoskeletal: Full ROM, 5/5 strength, normal gait.  Skin: Warm, dry without rashes, lesions, ecchymosis.  Neuro: Cranial nerves intact. Normal muscle tone, no cerebellar symptoms. Sensation intact.  Psych: Awake and oriented X 3, normal affect, Insight and Judgment appropriate.     Izora Ribas, NP 2:14 PM [facility]

## 2020-11-27 ENCOUNTER — Ambulatory Visit: Payer: 59 | Admitting: Adult Health

## 2020-11-27 DIAGNOSIS — N179 Acute kidney failure, unspecified: Secondary | ICD-10-CM

## 2020-11-27 DIAGNOSIS — I10 Essential (primary) hypertension: Secondary | ICD-10-CM

## 2020-11-27 DIAGNOSIS — Z8673 Personal history of transient ischemic attack (TIA), and cerebral infarction without residual deficits: Secondary | ICD-10-CM

## 2020-12-06 ENCOUNTER — Other Ambulatory Visit: Payer: Self-pay

## 2020-12-06 ENCOUNTER — Encounter: Payer: Self-pay | Admitting: Internal Medicine

## 2020-12-06 ENCOUNTER — Ambulatory Visit (INDEPENDENT_AMBULATORY_CARE_PROVIDER_SITE_OTHER): Payer: 59 | Admitting: Adult Health

## 2020-12-06 VITALS — BP 130/78 | HR 80 | Temp 97.9°F | Resp 17 | Ht 62.0 in | Wt 163.4 lb

## 2020-12-06 DIAGNOSIS — Z79899 Other long term (current) drug therapy: Secondary | ICD-10-CM | POA: Diagnosis not present

## 2020-12-06 DIAGNOSIS — N179 Acute kidney failure, unspecified: Secondary | ICD-10-CM

## 2020-12-06 DIAGNOSIS — R0989 Other specified symptoms and signs involving the circulatory and respiratory systems: Secondary | ICD-10-CM

## 2020-12-06 LAB — COMPLETE METABOLIC PANEL WITH GFR
AG Ratio: 1.6 (calc) (ref 1.0–2.5)
ALT: 35 U/L — ABNORMAL HIGH (ref 6–29)
AST: 44 U/L — ABNORMAL HIGH (ref 10–35)
Albumin: 4.5 g/dL (ref 3.6–5.1)
Alkaline phosphatase (APISO): 96 U/L (ref 37–153)
BUN: 11 mg/dL (ref 7–25)
CO2: 31 mmol/L (ref 20–32)
Calcium: 10.3 mg/dL (ref 8.6–10.4)
Chloride: 99 mmol/L (ref 98–110)
Creat: 0.77 mg/dL (ref 0.50–0.99)
GFR, Est African American: 97 mL/min/{1.73_m2} (ref >=60)
GFR, Est Non African American: 83 mL/min/{1.73_m2} (ref >=60)
Globulin: 2.9 g/dL (calc) (ref 1.9–3.7)
Glucose, Bld: 92 mg/dL (ref 65–99)
Potassium: 3.9 mmol/L (ref 3.5–5.3)
Sodium: 141 mmol/L (ref 135–146)
Total Bilirubin: 0.3 mg/dL (ref 0.2–1.2)
Total Protein: 7.4 g/dL (ref 6.1–8.1)

## 2020-12-06 LAB — MAGNESIUM: Magnesium: 1.8 mg/dL (ref 1.5–2.5)

## 2020-12-06 NOTE — Progress Notes (Deleted)
Future Appointments  Date Time Provider [place]  12/06/2020 11:30 AM Unk Pinto, MD [facility] None  03/18/2021  1:15 PM Frann Rider, NP [facility] None  03/21/2021  9:30 AM Liane Comber, NP [facility] None  09/17/2021  9:00 AM Liane Comber, NP [facility] None    History of Present Illness:      This very nice 60 y.o.  MWF  followed with Hypertension, Hyperlipidemia, Pre-Diabetes and Vitamin D Deficiency &  hx/o a Thalamic Stroke in Jan 2022   Labs on 5/17 showed AKI with drop GFR from 37  In Jan to 38 in Mar and on 5/17 GFR 35 . Lasix was dd/c'd & Amlodipine added for BP    Medications   Current Outpatient Medications (Cardiovascular):  .  amLODipine (NORVASC) 5 MG tablet, Take 1 tablet (5 mg total) by mouth daily. As night for blood pressure goal <130/80. Marland Kitchen  atenolol (TENORMIN) 100 MG tablet, Take  1 tablet  Daily for BP .  furosemide (LASIX) 40 MG tablet, Take  1 tablet  Daily  for BP  & Fluid Retention /Ankle Swelling .  rosuvastatin (CRESTOR) 20 MG tablet, TAKE 1 TABLET BY MOUTH EVERY DAY FOR CHOLESTEROL .  telmisartan (MICARDIS) 80 MG tablet, TAKE 1 TABLET DAILY FOR  BLOOD PRESSURE  Current Outpatient Medications (Respiratory):  .  montelukast (SINGULAIR) 10 MG tablet, TAKE 1 TABLET BY MOUTH  DAILY FOR ALLERGIES  Current Outpatient Medications (Analgesics):  .  allopurinol (ZYLOPRIM) 300 MG tablet, TAKE 1/2 TO 1 TAB BY MOUTH EVERY DAY TO PREVENT GOUT (Patient taking differently: Take 150 mg by mouth daily.) .  aspirin EC 81 MG EC tablet, Take 1 tablet (81 mg total) by mouth daily. Swallow whole.   Current Outpatient Medications (Other):  .  acyclovir (ZOVIRAX) 400 MG tablet, TAKE 1 TABLET 2 X /DAY FOR FEVER BLISTERS (Patient taking differently: Take 400 mg by mouth 2 (two) times daily as needed (For fever blisters).) .  Ascorbic Acid (VITAMIN C PO), Take 1 tablet by mouth daily. .  cholecalciferol (VITAMIN D) 1000 units tablet, Take 1,000 Units by  mouth daily. .  clobetasol (TEMOVATE) 0.05 % external solution, Apply 1 application topically 2 (two) times daily. Marland Kitchen  gabapentin (NEURONTIN) 600 MG tablet, Take 1.5 tablets (900 mg total) by mouth 3 (three) times daily. Marland Kitchen  MAGNESIUM PO, Take 1,600 mg by mouth daily. .  milk thistle 175 MG tablet, Take 175 mg by mouth daily. .  Omega-3 Fatty Acids (FISH OIL) 1000 MG CAPS, Take 1,000 mg by mouth daily. .  pantoprazole (PROTONIX) 40 MG tablet, TAKE 1 TABLET BY MOUTH DAILY FOR INDIGESTION & ACID REFLUX .  traZODone (DESYREL) 100 MG tablet, Take 1/2 to 1 tablet 1 hour before bedtime as needed for sleep.  Problem list She has Hypertension; Environmental allergies; Anemia; Anxiety; Asthma; Gout; Hyperlipidemia; Vitamin D deficiency; History of ischemic vertebrobasilar artery thalamic stroke; Overweight (BMI 25.0-29.9); and GERD (gastroesophageal reflux disease) on their problem list.   Observations/Objective:  LMP 08/14/2018   HEENT - WNL. Neck - supple.  Chest - Clear equal BS. Cor - Nl HS. RRR w/o sig MGR. PP 1(+). No edema. MS- FROM w/o deformities.  Gait Nl. Neuro -  Nl w/o focal abnormalities.   Assessment and Plan:      Follow Up Instructions:        I discussed the assessment and treatment plan with the patient. The patient was provided an opportunity to ask questions and all were  answered. The patient agreed with the plan and demonstrated an understanding of the instructions.       The patient was advised to call back or seek an in-person evaluation if the symptoms worsen or if the condition fails to improve as anticipated.    Kirtland Bouchard, MD

## 2020-12-06 NOTE — Progress Notes (Signed)
Assessment and Plan:  Valerie Hobbs was seen today for follow-up.  Diagnoses and all orders for this visit:  Labile hypertension Much improved, Continue medications Monitor blood pressure at home; call if consistently over 130/80 Continue DASH diet.   Reminder to go to the ER if any CP, SOB, nausea, dizziness, severe HA, changes vision/speech, left arm numbness and tingling and jaw pain. -     COMPLETE METABOLIC PANEL WITH GFR -     Magnesium  AKI (acute kidney injury) ([place]) -     COMPLETE METABOLIC PANEL WITH GFR  Medication management -     COMPLETE METABOLIC PANEL WITH GFR -     Magnesium   Further disposition pending results of labs. Discussed med's effects and SE's.   Over 15 minutes of exam, counseling, chart review, and critical decision making was performed.   Future Appointments  Date Time Provider [place]  03/18/2021  1:15 PM Frann Rider, NP [facility] None  03/21/2021  9:30 AM Liane Comber, NP [facility] None  09/17/2021  9:00 AM Liane Comber, NP [facility] None    ------------------------------------------------------------------------------------------------------------------   HPI 60 y.o.female with hx of htn, thalamic stroke in 06/2020 presents for follow up on htn and renal functions.   Labs on 5/17 showed AKI with drop GFR from 89  In Jan to 56 in Mar and on 5/17 GFR 35 . Lasix was reduced back down from 40 mg BID to once daily (had tolerated well previously) and Amlodipine 5 mg was added for BP. She is also on telmisartan 80 mg daily and atenolol 100 mg daily.   Her blood pressure has been controlled at home, today their BP is BP: 130/78  She does not workout. She denies chest pain, shortness of breath, dizziness.  She has been pushing fluid intake, fills 45 fluid ounce jug with ice in AM and fills throughout day, getting 65+ fluid ounces daily.  Lab Results  Component Value Date   GFRNONAA 35 (L) 11/13/2020   GFRNONAA 62 09/17/2020   GFRNONAA  >60 07/13/2020   Lab Results  Component Value Date   CREATININE 1.56 (H) 11/13/2020   CREATININE 0.99 09/17/2020   CREATININE 0.84 07/13/2020       Past Medical History:  Diagnosis Date   Allergy    Anemia    Anxiety    Asthma    Hypertension    Thalamic stroke ([place]) 07/13/2020     Allergies  Allergen Reactions   Ciprofloxacin Nausea Only    Headache   Penicillins     Allergy has been more than 35yrs but unsure of what reaction was    Current Outpatient Medications on File Prior to Visit  Medication Sig   acyclovir (ZOVIRAX) 400 MG tablet TAKE 1 TABLET 2 X /DAY FOR FEVER BLISTERS (Patient taking differently: Take 400 mg by mouth 2 (two) times daily as needed (For fever blisters).)   allopurinol (ZYLOPRIM) 300 MG tablet TAKE 1/2 TO 1 TAB BY MOUTH EVERY DAY TO PREVENT GOUT (Patient taking differently: Take 150 mg by mouth daily.)   amLODipine (NORVASC) 5 MG tablet Take 1 tablet (5 mg total) by mouth daily. As night for blood pressure goal <130/80.   Ascorbic Acid (VITAMIN C PO) Take 1 tablet by mouth daily.   aspirin EC 81 MG EC tablet Take 1 tablet (81 mg total) by mouth daily. Swallow whole.   atenolol (TENORMIN) 100 MG tablet Take  1 tablet  Daily for BP   cholecalciferol (VITAMIN D) 1000 units  tablet Take 1,000 Units by mouth daily.   furosemide (LASIX) 40 MG tablet Take  1 tablet  Daily  for BP  & Fluid Retention /Ankle Swelling   gabapentin (NEURONTIN) 600 MG tablet Take 1.5 tablets (900 mg total) by mouth 3 (three) times daily.   MAGNESIUM PO Take 1,600 mg by mouth daily.   milk thistle 175 MG tablet Take 175 mg by mouth daily.   montelukast (SINGULAIR) 10 MG tablet TAKE 1 TABLET BY MOUTH  DAILY FOR ALLERGIES   Omega-3 Fatty Acids (FISH OIL) 1000 MG CAPS Take 1,000 mg by mouth daily.   pantoprazole (PROTONIX) 40 MG tablet TAKE 1 TABLET BY MOUTH DAILY FOR INDIGESTION & ACID REFLUX   rosuvastatin (CRESTOR) 20 MG tablet TAKE 1 TABLET BY MOUTH EVERY DAY FOR CHOLESTEROL    telmisartan (MICARDIS) 80 MG tablet TAKE 1 TABLET DAILY FOR  BLOOD PRESSURE   traZODone (DESYREL) 100 MG tablet Take 1/2 to 1 tablet 1 hour before bedtime as needed for sleep.   clobetasol (TEMOVATE) 0.05 % external solution Apply 1 application topically 2 (two) times daily. (Patient not taking: Reported on 12/06/2020)   No current facility-administered medications on file prior to visit.    ROS: all negative except above.   Physical Exam:  BP 130/78 (BP Location: Right Arm, Patient Position: Sitting, Cuff Size: Normal)   Pulse 80   Temp 97.9 F (36.6 C)   Resp 17   Ht 5\' 2"  (1.575 m)   Wt 163 lb 6.4 oz (74.1 kg)   LMP 08/14/2018   SpO2 99%   BMI 29.89 kg/m   General Appearance: Well nourished, in no apparent distress. Eyes: PERRLA, conjunctiva no swelling or erythema ENT/Mouth: Mask in place; Hearing normal.  Neck: Supple Respiratory: Respiratory effort normal, BS equal bilaterally without rales, rhonchi, wheezing or stridor.  Cardio: RRR with no MRGs. Brisk peripheral pulses , scant non-pitting edema left ankle.  Abdomen: Soft, + BS.  Non tender Lymphatics: Non tender without lymphadenopathy.  Musculoskeletal: No obvious deformity, normal gait.  Skin: Warm, dry without rashes, lesions, ecchymosis.  Neuro: Normal muscle tone Psych: Awake and oriented X 3, normal affect, Insight and Judgment appropriate.     Izora Ribas, NP 12:38 PM [facility]

## 2020-12-08 NOTE — Progress Notes (Signed)
============================================================ -   Test results slightly outside the reference range are not unusual. If there is anything important, I will review this with you,  otherwise it is considered normal test values.  If you have further questions,  please do not hesitate to contact me at the office or via My Chart.  ============================================================ ============================================================  -  Kidney functions Much Much Better  - - > > Back to Normal  !   - liver enzymes are still slightly elevated   - will continue to monitor periodically   ============================================================ ============================================================  -  Magnesium  -   1.8   -  very  low- goal is betw 2.0 - 2.5,   - So..............Marland Kitchen  Recommend that you take  Magnesium 500 mg tablet daily   - also important to eat lots of  leafy green vegetables   - spinach - Kale - collards - greens - okra - asparagus  - broccoli - quinoa - squash - almonds   - black, red, white beans  -  peas - green beans ============================================================ ============================================================

## 2020-12-24 ENCOUNTER — Other Ambulatory Visit: Payer: Self-pay | Admitting: Adult Health

## 2020-12-24 ENCOUNTER — Other Ambulatory Visit: Payer: Self-pay | Admitting: Internal Medicine

## 2020-12-24 DIAGNOSIS — I671 Cerebral aneurysm, nonruptured: Secondary | ICD-10-CM

## 2020-12-24 DIAGNOSIS — K2100 Gastro-esophageal reflux disease with esophagitis, without bleeding: Secondary | ICD-10-CM

## 2020-12-24 DIAGNOSIS — E782 Mixed hyperlipidemia: Secondary | ICD-10-CM

## 2020-12-24 DIAGNOSIS — I10 Essential (primary) hypertension: Secondary | ICD-10-CM

## 2020-12-24 MED ORDER — PANTOPRAZOLE SODIUM 40 MG PO TBEC: DELAYED_RELEASE_TABLET | ORAL | 3 refills | Status: DC

## 2020-12-24 MED ORDER — AMLODIPINE BESYLATE 5 MG PO TABS: ORAL_TABLET | ORAL | 3 refills | Status: DC

## 2020-12-24 MED ORDER — ROSUVASTATIN CALCIUM 20 MG PO TABS: ORAL_TABLET | ORAL | 3 refills | Status: DC

## 2020-12-25 ENCOUNTER — Telehealth: Payer: Self-pay | Admitting: Adult Health

## 2020-12-25 NOTE — Telephone Encounter (Signed)
12/25/20 [facility] [place] sent to [facility]

## 2020-12-27 ENCOUNTER — Other Ambulatory Visit: Payer: Self-pay | Admitting: Adult Health

## 2020-12-27 DIAGNOSIS — I671 Cerebral aneurysm, nonruptured: Secondary | ICD-10-CM

## 2021-02-11 ENCOUNTER — Other Ambulatory Visit: Payer: Self-pay | Admitting: *Deleted

## 2021-02-11 ENCOUNTER — Encounter: Payer: Self-pay | Admitting: Adult Health

## 2021-02-11 MED ORDER — GABAPENTIN 600 MG PO TABS: 900.0000 mg | ORAL_TABLET | Freq: Three times a day (TID) | ORAL | 2 refills | Status: DC

## 2021-02-27 ENCOUNTER — Other Ambulatory Visit: Payer: Self-pay | Admitting: Adult Health

## 2021-03-12 ENCOUNTER — Other Ambulatory Visit: Payer: Self-pay

## 2021-03-12 ENCOUNTER — Ambulatory Visit
Admission: RE | Admit: 2021-03-12 | Discharge: 2021-03-12 | Disposition: A | Discharge status: Home / Self Care | Payer: 59 | Source: Ambulatory Visit | Attending: Adult Health | Admitting: Adult Health

## 2021-03-12 DIAGNOSIS — I671 Cerebral aneurysm, nonruptured: Secondary | ICD-10-CM

## 2021-03-12 MED ORDER — IOPAMIDOL (ISOVUE-370) INJECTION 76%
75.0000 mL | Freq: Once | INTRAVENOUS | Status: AC | PRN
  Administered 2021-03-12: 75 mL via INTRAVENOUS

## 2021-03-18 ENCOUNTER — Other Ambulatory Visit: Payer: Self-pay | Admitting: Internal Medicine

## 2021-03-18 ENCOUNTER — Ambulatory Visit (INDEPENDENT_AMBULATORY_CARE_PROVIDER_SITE_OTHER): Payer: 59 | Admitting: Adult Health

## 2021-03-18 ENCOUNTER — Encounter: Payer: Self-pay | Admitting: Adult Health

## 2021-03-18 ENCOUNTER — Other Ambulatory Visit: Payer: Self-pay

## 2021-03-18 VITALS — BP 133/85 | HR 75 | Ht 62.0 in | Wt 166.2 lb

## 2021-03-18 DIAGNOSIS — I6381 Other cerebral infarction due to occlusion or stenosis of small artery: Secondary | ICD-10-CM

## 2021-03-18 DIAGNOSIS — Z1231 Encounter for screening mammogram for malignant neoplasm of breast: Secondary | ICD-10-CM

## 2021-03-18 DIAGNOSIS — I639 Cerebral infarction, unspecified: Secondary | ICD-10-CM | POA: Diagnosis not present

## 2021-03-18 DIAGNOSIS — G890 Central pain syndrome: Secondary | ICD-10-CM

## 2021-03-18 DIAGNOSIS — I671 Cerebral aneurysm, nonruptured: Secondary | ICD-10-CM

## 2021-03-18 NOTE — Progress Notes (Signed)
[facility] [address] [place]. [place] [address] [phone]       STROKE FOLLOW UP NOTE  [name] Date of Birth:  [birth year] Medical Record Number:  [id number]   Reason for Referral: stroke follow up    SUBJECTIVE:   CHIEF COMPLAINT:  Chief Complaint  Patient presents with   Follow-up    Room 1. Pt alone. No complaints, except she thinks she may have had an allergic reaction to the contrast for a recent CT scan.      HPI:   Today, 03/17/2021, [name] returns for 37-month stroke follow-up.  Overall doing well.  Reports improvements of right sided painful paresthesias after increasing gabapentin dosage currently taking 900 mg 3 times daily tolerating without side effects.  Denies new stroke/TIA symptoms. Remains on aspirin and Crestor 20mg  daily. Blood pressure today 133/85. Monitors at home and has been stable.  Recently completed CTA for surveillance monitoring of cerebral aneurysm which showed stable appearance but reports presence of small itchy red spots on right arm (side IV was placed on) the morning after. Denies rash or red spots elsewhere. Did feel some chest tightness while receiving contrast more so than normal (per patient) but shortly resolved.  Has follow-up visit scheduled next month with PCP for annual exam and plans on repeat lab work.  No further concerns at this time.    History provided for reference purposes only Update 11/19/2020 JM: [name] returns for 70-month stroke follow-up accompanied  She reports continued right arm and leg paresthesias with some improvement of her arm but not her leg.  She has continued on gabapentin 300-600mg  TID which has been beneficial  But continues to experience pain especially upon awakening in the morning.  She continues to work as a Statistician and feels like she would not be able to work without use of the gabapentin. Denies new stroke/TIA symptoms  Compliant on aspirin  and Crestor without associated side effects Blood pressure today 137/81 -routinely monitors at home and typically 120s/80s Denies tobacco, EtOH or substance abuse  Currently being followed by Ortho for left shoulder subluxation and plans on starting PT 6/1 in hopes of avoiding surgical procedure  No further concerns at this time  Initial visit 08/20/2020 JM: she is being seen for hospital follow up. Reports residual right sided arm and leg numbness/tingling sensation as well as tightness sensation. Gradual improvement since discharge. Denies specific weakness. She does report burning sensation with increased ambulation. PCP rx'd gabapentin 100mg  1-2 cap TID without much benefit.  Denies new stroke/TIA symptoms.  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  Completed 3 weeks DAPT and remains on aspirin 81mg  daily without side effects. On Crestor 20mg  daily without myalgias. Blood pressure today 166/98. Is planning on starting to monitor at home and has follow-up scheduled PCP 3/9 for follow-up evaluation.  Denies continued tobacco, excessive EtOH or substance abuse.  Stroke admission 07/13/2020 [name] is a 60 y.o. female with history of hypertension, anxiety, and asthma, who presented to [facility] ED on 07/13/2020 for evaluation of sudden onset of right-sided numbness.  Personally reviewed hospitalization pertinent progress notes, lab work and imaging.  Evaluated by Dr. Leonie Man with stroke work-up revealing acute left thalamic infarct likely due to small vessel disease.  CTA head/neck unremarkable except for incidental finding of 61mm right supraclinoid ICA aneurysm.  Recommended DAPT for 3 weeks then aspirin alone.  LDL 76.  A1c 5.3.  Other stroke risk factors include HTN, EtOH use, tobacco use and substance abuse (THC).  Evaluated by therapies and discharged home in stable condition without therapy needs and residual right hemibody paresthesias.  Stroke:  acute left thalamic infarct likely due to small vessel disease.  Resultant right hemibody paresthesias  code Stroke CT Head - not ordered CT head - No acute intracranial findings. Chronic microvascular ischemic change and cerebral volume loss.  MRI head - Acute left thalamic infarct. Moderate chronic small vessel ischemic disease.  MRA head - not ordered CTA H&N - No large vessel occlusion or significant stenosis in the head and neck. 2 mm right supraclinoid ICA aneurysm. Known acute left thalamic infarct.  2D Echo -normal ejection fraction without cardiac source of embolism. LDL - 76 HgbA1c - 5.3 UDS - THCU VTE prophylaxis - Lovenox No antithrombotic prior to admission, now on aspirin 81 mg daily and clopidogrel 75 mg daily for 3 weeks then aspirin alone Patient will be counseled to be compliant with her antithrombotic medications Ongoing aggressive stroke risk factor management Therapy recommendations: None  Disposition: Home     ROS:   14 system review of systems performed and negative with exception of those listed in HPI  PMH:  Past Medical History:  Diagnosis Date   Allergy    Anemia    Anxiety    Asthma    Hypertension    Thalamic stroke ([place]) 07/13/2020    PSH:  Past Surgical History:  Procedure Laterality Date   NO PAST SURGERIES      Social History:  Social History   Socioeconomic History   Marital status: Married    Spouse name: Not on file   Number of children: Not on file   Years of education: Not on file   Highest education level: Not on file  Occupational History   Not on file  Tobacco Use   Smoking status: Never   Smokeless tobacco: Never  Vaping Use   Vaping Use: Never used  Substance and Sexual Activity   Alcohol use: Yes    Alcohol/week: 14.0 standard drinks    Types: 14 Standard drinks or equivalent per week    Comment: daily   Drug use: Not Currently    Types: Marijuana    Comment: Quit 06/2020   Sexual activity: Yes    Partners:  Male    Birth control/protection: Post-menopausal  Other Topics Concern   Not on file  Social History Narrative   Not on file   Social Determinants of Health   Financial Resource Strain: Not on file  Food Insecurity: Not on file  Transportation Needs: Not on file  Physical Activity: Not on file  Stress: Not on file  Social Connections: Not on file  Intimate Partner Violence: Not on file    Family History:  Family History  Problem Relation Age of Onset   Hypertension Mother    Diabetes Mother    Thyroid disease Mother    Cataracts Mother    Cirrhosis Mother        fatty liver   Diabetes Father    Hypertension Father    Heart disease Father        bypass   Dementia Father        LEWY BODY   Dementia Maternal Grandmother    Heart disease Maternal Grandmother    Cancer Paternal Grandmother     Medications:   Current Outpatient Medications on File Prior to Visit  Medication Sig Dispense Refill  acyclovir (ZOVIRAX) 400 MG tablet TAKE 1 TABLET 2 X /DAY FOR FEVER BLISTERS (Patient taking differently: Take 400 mg by mouth 2 (two) times daily as needed (For fever blisters).) 180 tablet 3   allopurinol (ZYLOPRIM) 300 MG tablet TAKE 1/2 TO 1 TAB BY MOUTH EVERY DAY TO PREVENT GOUT (Patient taking differently: Take 150 mg by mouth daily.) 30 tablet 8   amLODipine (NORVASC) 5 MG tablet Take 1 tablet Daily for BP  (goal <130/80) 90 tablet 3   Ascorbic Acid (VITAMIN C PO) Take 1 tablet by mouth daily.     aspirin EC 81 MG EC tablet Take 1 tablet (81 mg total) by mouth daily. Swallow whole. 30 tablet 11   atenolol (TENORMIN) 100 MG tablet TAKE 1 TABLET BY MOUTH  DAILY FOR BLOOD PRESSURE 90 tablet 3   cholecalciferol (VITAMIN D) 1000 units tablet Take 1,000 Units by mouth daily.     furosemide (LASIX) 40 MG tablet Take  1 tablet  Daily  for BP  & Fluid Retention /Ankle Swelling 90 tablet 1   gabapentin (NEURONTIN) 600 MG tablet Take 1.5 tablets (900 mg total) by mouth 3 (three) times  daily. 405 tablet 2   MAGNESIUM PO Take 1,600 mg by mouth daily.     milk thistle 175 MG tablet Take 175 mg by mouth daily.     montelukast (SINGULAIR) 10 MG tablet TAKE 1 TABLET BY MOUTH  DAILY FOR ALLERGIES 90 tablet 3   Omega-3 Fatty Acids (FISH OIL) 1000 MG CAPS Take 1,000 mg by mouth daily.     pantoprazole (PROTONIX) 40 MG tablet Take  1 tablet  Daily  to Prevent Heartburn & Acid Reflux 90 tablet 3   rosuvastatin (CRESTOR) 20 MG tablet Take  1 tablet  Daily  for Cholesterol   (Dx: e78.5) 90 tablet 3   telmisartan (MICARDIS) 80 MG tablet TAKE 1 TABLET DAILY FOR  BLOOD PRESSURE 90 tablet 3   traZODone (DESYREL) 100 MG tablet Take 1/2 to 1 tablet 1 hour before bedtime as needed for sleep. 31 tablet 3   No current facility-administered medications on file prior to visit.    Allergies:   Allergies  Allergen Reactions   Ciprofloxacin Nausea Only    Headache   Penicillins     Allergy has been more than 42yrs but unsure of what reaction was      OBJECTIVE:  Physical Exam  Vitals:   03/18/21 1302  BP: 133/85  Pulse: 75  Weight: 166 lb 3.2 oz (75.4 kg)  Height: 5\' 2"  (1.575 m)   Body mass index is 30.4 kg/m. No results found.  General: well developed, well nourished, pleasant middle-age Caucasian female, seated, in no evident distress Head: head normocephalic and atraumatic.   Neck: supple with no carotid or supraclavicular bruits Cardiovascular: regular rate and rhythm, no murmurs Musculoskeletal: no deformity; limited left shoulder ROM Skin:  6-7 red raised bumps on right forearm Vascular:  Normal pulses all extremities   Neurologic Exam Mental Status: Awake and fully alert. Fluent speech and language. Oriented to place and time. Recent and remote memory intact. Attention span, concentration and fund of knowledge appropriate. Mood and affect appropriate.  Cranial Nerves: Fundoscopic exam reveals sharp disc margins. Pupils equal, briskly reactive to light. Extraocular  movements full without nystagmus. Visual fields full to confrontation. Hearing intact. Facial sensation intact. Face, tongue, palate moves normally and symmetrically.  Motor: Normal bulk and tone. Normal strength in all tested extremity muscles except difficulty fully  testing LUE d/t decreased shoulder ROM Sensory.: intact to touch , pinprick , position and vibratory sensation except mild " scratchiness sensation" with light touch RUE and RLE Coordination: Rapid alternating movements normal in all extremities.  Finger-to-nose and heel-to-shin performed accurately bilaterally Gait and Station: Arises from chair without difficulty. Stance is normal. Gait demonstrates normal stride length and balance without use of assistive device Reflexes: 1+ and symmetric. Toes downgoing.          ASSESSMENT: Anahli Arvanitis is a 60 y.o. year old female presented with sudden onset of right-sided numbness on 07/13/2020 with stroke work-up revealing left thalamic infarct secondary to small vessel disease. Vascular risk factors include HTN, HLD, tobacco use, EtOH use and substance abuse. 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  Well-controlled on gabapentin 900 mg 3 times daily -refill provided Continue aspirin 81 mg daily  and Crestor 20 mg daily for secondary stroke prevention.   Discussed secondary stroke prevention measures and importance of close PCP follow up for aggressive stroke risk factor management  HTN: BP goal <130/90.  Well-controlled on current regimen per PCP HLD: LDL goal <70.  Prior LDL 60 on atorvastatin 40mg  daily per PCP. Plans on repeat labs next month with PCP R ICA aneurysm: CTA head reported 2 mm right supraclinoid ICA aneurysm.  Repeat CTA 02/2021 stable appearance.  Plan on repeating in 1 year.  No prior history or family history of aneurysms.  Small bumps on RUE seem more  consistent with insect bite but advised to continue to monitor. Low suspicion for contrast allergy    Follow up in 1 year or call earlier if needed   CC:  [place] provider: Dr. Armanda Magic, Gwyndolyn Saxon, MD    I spent 32 minutes of face-to-face and non-face-to-face time with patient.  This included previsit chart review, lab review, study review, electronic health record documentation, patient education and discussion regarding history of prior stroke, residual deficits, secondary stroke prevention measures and aggressive stroke risk factor management, cerebral aneurysm and answered all other questions to patient satisfaction   Frann Rider, AGNP-BC  [facility] [address] [place], [place] [address]  Phone [phone] Fax [phone] Note: This document was prepared with digital dictation and possible smart phrase technology. Any transcriptional errors that result from this process are unintentional.

## 2021-03-18 NOTE — Patient Instructions (Addendum)
No changes today. Continue current treatment regimen. Follow up in 1 year or call earlier if needed

## 2021-03-19 NOTE — Progress Notes (Signed)
I agree with the above plan 

## 2021-03-21 ENCOUNTER — Ambulatory Visit: Payer: 59 | Admitting: Nurse Practitioner

## 2021-03-30 ENCOUNTER — Emergency Department (HOSPITAL_BASED_OUTPATIENT_CLINIC_OR_DEPARTMENT_OTHER): Payer: 59

## 2021-03-30 ENCOUNTER — Encounter (HOSPITAL_BASED_OUTPATIENT_CLINIC_OR_DEPARTMENT_OTHER): Payer: Self-pay | Admitting: Emergency Medicine

## 2021-03-30 ENCOUNTER — Emergency Department (HOSPITAL_BASED_OUTPATIENT_CLINIC_OR_DEPARTMENT_OTHER)
Admission: EM | Admit: 2021-03-30 | Discharge: 2021-03-30 | Disposition: A | Discharge status: Home / Self Care | Payer: 59 | Attending: Emergency Medicine | Admitting: Emergency Medicine

## 2021-03-30 ENCOUNTER — Other Ambulatory Visit: Payer: Self-pay

## 2021-03-30 DIAGNOSIS — H1131 Conjunctival hemorrhage, right eye: Secondary | ICD-10-CM | POA: Diagnosis not present

## 2021-03-30 DIAGNOSIS — Z7982 Long term (current) use of aspirin: Secondary | ICD-10-CM | POA: Diagnosis not present

## 2021-03-30 DIAGNOSIS — J45909 Unspecified asthma, uncomplicated: Secondary | ICD-10-CM | POA: Insufficient documentation

## 2021-03-30 DIAGNOSIS — I10 Essential (primary) hypertension: Secondary | ICD-10-CM | POA: Insufficient documentation

## 2021-03-30 DIAGNOSIS — Z79899 Other long term (current) drug therapy: Secondary | ICD-10-CM | POA: Diagnosis not present

## 2021-03-30 DIAGNOSIS — W2209XA Striking against other stationary object, initial encounter: Secondary | ICD-10-CM | POA: Diagnosis not present

## 2021-03-30 MED ORDER — FLUORESCEIN SODIUM 1 MG OP STRP
1.0000 | ORAL_STRIP | Freq: Once | OPHTHALMIC | Status: DC
  Filled 2021-03-30: qty 1

## 2021-03-30 MED ORDER — OFLOXACIN 0.3 % OP SOLN: 1.0000 [drp] | Freq: Four times a day (QID) | OPHTHALMIC | 0 refills | Status: AC

## 2021-03-30 MED ORDER — TETRACAINE HCL 0.5 % OP SOLN
1.0000 [drp] | Freq: Once | OPHTHALMIC | Status: DC
  Filled 2021-03-30: qty 4

## 2021-03-30 NOTE — ED Provider Notes (Signed)
[facility] EMERGENCY DEPARTMENT Provider Note   CSN: [id number] Arrival date & time: 03/30/21  1119     History Chief Complaint  Patient presents with   Eye Injury    Valerie Hobbs is a 60 y.o. female.   Eye Injury Pertinent negatives include no chest pain, no abdominal pain, no headaches and no shortness of breath. Patient presents to injury to her right eye.  She denies any history of right eye conditions or procedures.  Shortly prior to arrival, she was out walking, on her way to feed some feral cats.  During this walk, she inadvertently walked into a tree branch.  A piece of the tree branch struck her in the right eye.  She subsequently noticed some bloody tears from her right eye.  Right eye has appeared swollen.  There is mild discomfort.  She denies any vision changes.  She denies any injury to her left eye.  She denies any use of anticoagulation medications.  She does take a baby aspirin.  She denies any known bleeding disorders.    Past Medical History:  Diagnosis Date   Allergy    Anemia    Anxiety    Asthma    Hypertension    Thalamic stroke ([place]) 07/13/2020    Patient Active Problem List   Diagnosis Date Noted   History of ischemic vertebrobasilar artery thalamic stroke 09/14/2020   Overweight (BMI 25.0-29.9) 09/14/2020   GERD (gastroesophageal reflux disease) 09/14/2020   Gout 12/12/2015   Hyperlipidemia 12/12/2015   Vitamin D deficiency 12/12/2015   Hypertension    Environmental allergies    Anemia    Anxiety    Asthma     Past Surgical History:  Procedure Laterality Date   NO PAST SURGERIES       OB History   No obstetric history on file.     Family History  Problem Relation Age of Onset   Hypertension Mother    Diabetes Mother    Thyroid disease Mother    Cataracts Mother    Cirrhosis Mother        fatty liver   Diabetes Father    Hypertension Father    Heart disease Father        bypass   Dementia Father         LEWY BODY   Dementia Maternal Grandmother    Heart disease Maternal Grandmother    Cancer Paternal Grandmother     Social History   Tobacco Use   Smoking status: Never   Smokeless tobacco: Never  Vaping Use   Vaping Use: Never used  Substance Use Topics   Alcohol use: Yes    Alcohol/week: 14.0 standard drinks    Types: 14 Standard drinks or equivalent per week    Comment: daily   Drug use: Not Currently    Types: Marijuana    Comment: Quit 06/2020    Home Medications Prior to Admission medications   Medication Sig Start Date End Date Taking? Authorizing Provider  ofloxacin (OCUFLOX) 0.3 % ophthalmic solution Place 1 drop into the right eye 4 (four) times daily for 5 days. 03/30/21 04/04/21 Yes Godfrey Pick, MD  acyclovir (ZOVIRAX) 400 MG tablet TAKE 1 TABLET 2 X /DAY FOR FEVER BLISTERS Patient taking differently: Take 400 mg by mouth 2 (two) times daily as needed (For fever blisters). 05/29/20   Unk Pinto, MD  allopurinol (ZYLOPRIM) 300 MG tablet TAKE 1/2 TO 1 TAB BY MOUTH EVERY DAY TO PREVENT GOUT Patient  taking differently: Take 150 mg by mouth daily. 02/22/20   Liane Comber, NP  amLODipine (NORVASC) 5 MG tablet Take 1 tablet Daily for BP  (goal <130/80) 12/24/20   Unk Pinto, MD  Ascorbic Acid (VITAMIN C PO) Take 1 tablet by mouth daily.    [provider]  aspirin EC 81 MG EC tablet Take 1 tablet (81 mg total) by mouth daily. Swallow whole. 07/15/20   Geradine Girt, DO  atenolol (TENORMIN) 100 MG tablet TAKE 1 TABLET BY MOUTH  DAILY FOR BLOOD PRESSURE 02/28/21   Liane Comber, NP  cholecalciferol (VITAMIN D) 1000 units tablet Take 1,000 Units by mouth daily.    [provider]  furosemide (LASIX) 40 MG tablet Take  1 tablet  Daily  for BP  & Fluid Retention /Ankle Swelling 11/08/20   Unk Pinto, MD  gabapentin (NEURONTIN) 600 MG tablet Take 1.5 tablets (900 mg total) by mouth 3 (three) times daily. 02/11/21   Frann Rider, NP  MAGNESIUM PO  Take 1,600 mg by mouth daily.    [provider]  milk thistle 175 MG tablet Take 175 mg by mouth daily.    [provider]  montelukast (SINGULAIR) 10 MG tablet TAKE 1 TABLET BY MOUTH  DAILY FOR ALLERGIES 08/22/20   Liane Comber, NP  Omega-3 Fatty Acids (FISH OIL) 1000 MG CAPS Take 1,000 mg by mouth daily.    [provider]  pantoprazole (PROTONIX) 40 MG tablet Take  1 tablet  Daily  to Prevent Heartburn & Acid Reflux 12/24/20   Unk Pinto, MD  rosuvastatin (CRESTOR) 20 MG tablet Take  1 tablet  Daily  for Cholesterol   (Dx: e78.5) 12/24/20   Unk Pinto, MD  telmisartan (MICARDIS) 80 MG tablet TAKE 1 TABLET DAILY FOR  BLOOD PRESSURE 10/23/20   Unk Pinto, MD  traZODone (DESYREL) 100 MG tablet Take 1/2 to 1 tablet 1 hour before bedtime as needed for sleep. 09/19/20   Liane Comber, NP    Allergies    Ciprofloxacin and Penicillins  Review of Systems   Review of Systems  Constitutional:  Negative for chills and fever.  HENT:  Negative for ear pain and sore throat.   Eyes:  Positive for pain and redness. Negative for photophobia, itching and visual disturbance.  Respiratory:  Negative for cough and shortness of breath.   Cardiovascular:  Negative for chest pain and palpitations.  Gastrointestinal:  Negative for abdominal pain, nausea and vomiting.  Genitourinary:  Negative for dysuria and hematuria.  Musculoskeletal:  Negative for arthralgias and back pain.  Skin:  Negative for color change and rash.  Neurological:  Negative for dizziness, seizures, syncope, weakness, numbness and headaches.  Hematological:  Does not bruise/bleed easily.  All other systems reviewed and are negative.  Physical Exam Updated Vital Signs BP (!) 152/85   Pulse 62   Temp 98.2 F (36.8 C) (Oral)   Resp 15   Ht 5\' 3"  (1.6 m)   Wt 74.8 kg   LMP 08/14/2018   SpO2 97%   BMI 29.23 kg/m   Physical Exam Vitals and nursing note reviewed.  Constitutional:       General: She is not in acute distress.    Appearance: She is well-developed and normal weight. She is not ill-appearing, toxic-appearing or diaphoretic.  HENT:     Head: Normocephalic and atraumatic.     Right Ear: External ear normal.     Left Ear: External ear normal.  Nose: Nose normal. No congestion.     Mouth/Throat:     Mouth: Mucous membranes are moist.     Pharynx: Oropharynx is clear.  Eyes:     Extraocular Movements: Extraocular movements intact.     Pupils: Pupils are equal, round, and reactive to light.     Comments: Severe conjunctival hemorrhage to lower portion of left eye.  At time of arrival, hemorrhage extends from 3:00 to 9:00.  At 6 o'clock position, there is an associated swelling.  No evidence of hyphema.  Visual acuity is intact.  Pupil is normal in appearance and reactive to light.  Extraocular movements are intact.  No evidence of foreign body.  No current bleeding or drainage.  No corneal fluorescein uptake.  Cardiovascular:     Rate and Rhythm: Normal rate and regular rhythm.     Heart sounds: No murmur heard. Pulmonary:     Effort: Pulmonary effort is normal. No respiratory distress.     Breath sounds: Normal breath sounds.  Abdominal:     Palpations: Abdomen is soft.     Tenderness: There is no abdominal tenderness.  Musculoskeletal:     Cervical back: Normal range of motion and neck supple.  Skin:    General: Skin is warm and dry.  Neurological:     General: No focal deficit present.     Mental Status: She is alert and oriented to person, place, and time.     Cranial Nerves: No cranial nerve deficit.     Sensory: No sensory deficit.     Motor: No weakness.  Psychiatric:        Mood and Affect: Mood normal.        Behavior: Behavior normal.     ED Results / Procedures / Treatments   Labs (all labs ordered are listed, but only abnormal results are displayed) Labs Reviewed - No data to display  EKG None  Radiology CT Orbits Wo  Contrast  Result Date: 03/30/2021 CLINICAL DATA:  Walked into a tree branch. Right eye injury. Denies vision loss. EXAM: CT ORBITS WITHOUT CONTRAST TECHNIQUE: Multidetector CT imaging of the orbits was performed using the standard protocol without intravenous contrast. Multiplanar CT image reconstructions were also generated. COMPARISON:  CT a head dated March 12, 2021. FINDINGS: Orbits: No orbital mass or evidence of inflammation. Normal appearance of the globes, optic nerve-sheath complexes, extraocular muscles, orbital fat and lacrimal glands. Visible paranasal sinuses: Clear. Soft tissues: Normal.  No radiopaque foreign body identified. Osseous: No fracture or aggressive lesion. Limited intracranial: No acute or significant finding. Old left thalamic lacunar infarct again noted. IMPRESSION: 1. Normal noncontrast CT of the orbits. Electronically Signed   By: Titus Dubin M.D.   On: 03/30/2021 12:38    Procedures Procedures   Medications Ordered in ED Medications - No data to display  ED Course  I have reviewed the triage vital signs and the nursing notes.  Pertinent labs & imaging results that were available during my care of the patient were reviewed by me and considered in my medical decision making (see chart for details).    MDM Rules/Calculators/A&P                          Patient is a healthy 60 year old female who presents for injury to her right eye.  Mechanism described as a tree branch striking her in the right eye.  Upon arrival, she has subconjunctival hemorrhage that extends from 3:00  to 9 o'clock position on her right eye.  Given concern for ruptured globe, CT scan of orbits was ordered.  Patient denied any current significant pain or nausea.  She was kept in an upright position when possible.  CT scan showed no evidence of globe rupture.  No foreign bodies were identified.  Upon the patient's return to exam room, tetracaine eyedrops and fluorescein stain were placed.  Eye  was examined under Woods lamp.  No fluorescein uptake was identified.  Given the mechanism and the severity of the conjunctival hemorrhage, which did extend from 2:00 to 10:00 positions while the patient was in the ED, I did discuss this patient with ophthalmology.  Ophthalmology recommended ofloxacin eyedrops and follow-up as needed.  They did state that this will likely be self-limited and resolve without any need for follow-up.  These directions were relayed to the patient.  Ofloxacin eyedrops were prescribed.  Patient was discharged in stable condition. Final Clinical Impression(s) / ED Diagnoses Final diagnoses:  Conjunctival hemorrhage of right eye    Rx / DC Orders ED Discharge Orders          Ordered    ofloxacin (OCUFLOX) 0.3 % ophthalmic solution  4 times daily        03/30/21 1332             Godfrey Pick, MD 03/31/21 0050

## 2021-03-30 NOTE — ED Notes (Signed)
Ocular procedure at this time, will get vitals when finished

## 2021-03-30 NOTE — ED Notes (Signed)
Eye pad applied to encourage pt to keep eyelid closed.

## 2021-03-30 NOTE — ED Notes (Signed)
ED Provider at bedside. 

## 2021-03-30 NOTE — ED Triage Notes (Signed)
Pt walked into tree branch just PTA, injuring RT eye; denies vision loss; significant redness/swelling to eyeball noted

## 2021-04-02 ENCOUNTER — Other Ambulatory Visit: Payer: Self-pay | Admitting: Internal Medicine

## 2021-04-05 ENCOUNTER — Ambulatory Visit: Payer: 59 | Admitting: Nurse Practitioner

## 2021-04-09 ENCOUNTER — Other Ambulatory Visit: Payer: Self-pay

## 2021-04-09 ENCOUNTER — Ambulatory Visit
Admission: RE | Admit: 2021-04-09 | Discharge: 2021-04-09 | Disposition: A | Discharge status: Home / Self Care | Payer: 59 | Source: Ambulatory Visit

## 2021-04-09 DIAGNOSIS — M1A9XX0 Chronic gout, unspecified, without tophus (tophi): Secondary | ICD-10-CM

## 2021-04-09 DIAGNOSIS — Z1231 Encounter for screening mammogram for malignant neoplasm of breast: Secondary | ICD-10-CM

## 2021-04-09 MED ORDER — ALLOPURINOL 300 MG PO TABS: ORAL_TABLET | ORAL | 8 refills | Status: DC

## 2021-04-17 NOTE — Progress Notes (Signed)
Assessment and Plan:   Valerie Hobbs was seen today for follow-up.  Diagnoses and all orders for this visit:  Essential hypertension - continue medications, DASH diet, exercise and monitor at home. Call if greater than 130/80.   -     CBC with Differential/Platelet  Hyperlipidemia, mixed - Continue medication, diet and exercise -     COMPLETE METABOLIC PANEL WITH GFR -     Lipid panel -     TSH  Vitamin D deficiency  Continue Vit D supplementation  AKI (acute kidney injury) ([place])  Kidney functions had returned to baseline 11/2020 will recheck today  Medication management  Continued  Chronic gout without tophus, unspecified cause, unspecified site  Continue allopurinol  Continue dietary modifications  Gastroesophageal reflux disease with esophagitis without hemorrhage  Continue Protonix and dietary modifications  Abnormal glucose -     Semaglutide,0.25 or 0.5MG /DOS, (OZEMPIC, 0.25 OR 0.5 MG/DOSE,) 2 MG/1.5ML SOPN; Inject 0.5 mg into the skin once a week. Continue diet and exercise.  Cervical cancer screening -     PAP, TP IMAGING, WNL RFLX HPV    Further disposition pending results of labs. Discussed med's effects and SE's.   Over 30 minutes of exam, counseling, chart review, and critical decision making was performed.   Future Appointments  Date Time Provider [place]  09/17/2021  9:00 AM Liane Comber, NP [facility] None  03/18/2022 12:45 PM Frann Rider, NP [facility] None    ------------------------------------------------------------------------------------------------------------------   HPI 60 y.o.female with hx of htn, thalamic stroke in 06/2020 presents for follow up on htn , hyperlipidemia, AKI, chronic gout , GERD.   Labs on 11/13/20 showed AKI with drop GFR from 89  In Jan to 90 in Mar and on 5/17 GFR 35 . Lasix was reduced back down from 40 mg BID to once daily (had tolerated well previously) and Amlodipine 5 mg was added for BP. She is also on  telmisartan 80 mg daily and atenolol 100 mg daily.   She was seen in ER on 03/30/21 for tree branch striking eye.  CT scan showed no evidence of globe rupture.  No foreign bodies were identified.  Upon the patient's return to exam room, tetracaine eyedrops and fluorescein stain were placed.  Eye was examined under Woods lamp.  No fluorescein uptake was identified.  Given the mechanism and the severity of the conjunctival hemorrhage, which did extend from 2:00 to 10:00 positions while the patient was in the ED, I did discuss this patient with ophthalmology.  Ophthalmology recommended ofloxacin eyedrops and follow-up as needed.  They did state that this will likely be self-limited and resolve without any need for follow-up.  These directions were relayed to the patient.  Ofloxacin eyedrops were prescribed.  Patient was discharged in stable condition. Eye is having no pain, completely healed.  No vision changes.   Her blood pressure has been controlled at home 135/80, today their BP is BP: 140/86 BP Readings from Last 3 Encounters:  04/18/21 140/86  03/30/21 (!) 152/85  03/18/21 133/85     She does not workout. She denies chest pain, shortness of breath, dizziness.   Pt had stroke 07/12/20 and has residual right sided weakness and does have nerve pain on right side from arm and down body to foot. Gabapentin 900mg  TID is controlling pain.   BMI is Body mass index is 29.94 kg/m., she has not been working on diet and exercise. Wt Readings from Last 3 Encounters:  04/18/21 169 lb (76.7 kg)  03/30/21 165 lb (74.8 kg)  03/18/21 166 lb 3.2 oz (75.4 kg)    She has been pushing fluid intake, fills 45 fluid ounce jug with ice in AM and fills throughout day, getting 65+ fluid ounces daily.  Lab Results  Component Value Date   GFRNONAA 83 12/06/2020   GFRNONAA 35 (L) 11/13/2020   GFRNONAA 62 09/17/2020   Lab Results  Component Value Date   CREATININE 0.77 12/06/2020   CREATININE 1.56 (H) 11/13/2020    CREATININE 0.99 09/17/2020       Past Medical History:  Diagnosis Date   Allergy    Anemia    Anxiety    Asthma    Hypertension    Thalamic stroke ([place]) 07/13/2020     Allergies  Allergen Reactions   Ciprofloxacin Nausea Only    Headache   Penicillins     Allergy has been more than 34yrs but unsure of what reaction was    Current Outpatient Medications on File Prior to Visit  Medication Sig   acyclovir (ZOVIRAX) 400 MG tablet TAKE 1 TABLET 2 X /DAY FOR FEVER BLISTERS (Patient taking differently: Take 400 mg by mouth 2 (two) times daily as needed (For fever blisters).)   allopurinol (ZYLOPRIM) 300 MG tablet TAKE 1/2 TO 1 TAB BY MOUTH EVERY DAY TO PREVENT GOUT   amLODipine (NORVASC) 5 MG tablet Take 1 tablet Daily for BP  (goal <130/80)   Ascorbic Acid (VITAMIN C PO) Take 1 tablet by mouth daily.   aspirin EC 81 MG EC tablet Take 1 tablet (81 mg total) by mouth daily. Swallow whole.   atenolol (TENORMIN) 100 MG tablet TAKE 1 TABLET BY MOUTH  DAILY FOR BLOOD PRESSURE   cholecalciferol (VITAMIN D) 1000 units tablet Take 1,000 Units by mouth daily.   furosemide (LASIX) 40 MG tablet Take  1 tablet  Daily  for BP  & Fluid Retention /Ankle Swelling   gabapentin (NEURONTIN) 600 MG tablet Take 1.5 tablets (900 mg total) by mouth 3 (three) times daily.   MAGNESIUM PO Take 2,000 mg by mouth daily.   milk thistle 175 MG tablet Take 175 mg by mouth daily.   montelukast (SINGULAIR) 10 MG tablet TAKE 1 TABLET BY MOUTH  DAILY FOR ALLERGIES   Omega-3 Fatty Acids (FISH OIL) 1000 MG CAPS Take 1,000 mg by mouth daily.   pantoprazole (PROTONIX) 40 MG tablet Take  1 tablet  Daily  to Prevent Heartburn & Acid Reflux   rosuvastatin (CRESTOR) 20 MG tablet Take  1 tablet  Daily  for Cholesterol   (Dx: e78.5)   telmisartan (MICARDIS) 80 MG tablet TAKE 1 TABLET DAILY FOR  BLOOD PRESSURE   traZODone (DESYREL) 100 MG tablet TAKE 1/2 TO 1 TABLET 1 HOUR BEFORE BEDTIME AS NEEDED FOR SLEEP   No current  facility-administered medications on file prior to visit.    Review of Systems  Constitutional:  Negative for chills, fever and weight loss.  HENT:  Negative for congestion and hearing loss.   Eyes:  Negative for blurred vision and double vision.  Respiratory:  Negative for cough and shortness of breath.   Cardiovascular:  Negative for chest pain, palpitations, orthopnea and leg swelling.  Gastrointestinal:  Negative for abdominal pain, constipation, diarrhea, heartburn, nausea and vomiting.  Genitourinary:  Negative for dysuria.  Musculoskeletal:  Negative for falls, joint pain and myalgias.  Skin:  Negative for rash.  Neurological:  Negative for dizziness, tingling, tremors, loss of consciousness and headaches.  Endo/Heme/Allergies:  Does not bruise/bleed easily.  Psychiatric/Behavioral:  Negative for depression, memory loss and suicidal ideas.      Physical Exam:  BP 140/86   Pulse 83   Temp 97.9 F (36.6 C)   Wt 169 lb (76.7 kg)   LMP 08/14/2018   SpO2 96%   BMI 29.94 kg/m  General Appearance: Well nourished, in no apparent distress. Eyes: PERRLA, EOMs, conjunctiva no swelling or erythema Sinuses: No Frontal/maxillary tenderness ENT/Mouth: Ext aud canals clear, normal light reflex with TMs without erythema, bulging.  Good dentition. No erythema, swelling, or exudate on post pharynx. Tonsils not swollen or erythematous. Hearing normal.  Neck: Supple, thyroid normal. No bruits Respiratory: Respiratory effort normal, BS equal bilaterally without rales, rhonchi, wheezing or stridor. Cardio: RRR without murmurs, rubs or gallops. Brisk peripheral pulses without edema.  Chest: symmetric, with normal excursions and percussion. Breasts: getting mammograms, no concerns, defer Abdomen: Soft, +BS. Non tender, no guarding, rebound, hernias, masses, or organomegaly.  Lymphatics: Non tender without lymphadenopathy.  Genitourinary:Normal external genitalia.  Normal-appearing vaginal vault  and cervix.  Endocervical & exocervical Papanicolaou sample obtained.  Bimanual exam without abnormality.  Musculoskeletal: Full ROM all peripheral extremities and normal gait. 5/5 strength on left 4/5 strength right arm and leg. Skin: Warm, dry without rashes, lesions, ecchymosis.  Neuro: Cranial nerves intact, reflexes equal bilaterally. Normal muscle tone, no cerebellar symptoms. Sensation diminished RUE to monofilament, RLE absent to monofilament; intact to pressure.  Psych: Awake and oriented X 3, normal affect, Insight and Judgment appropriate.      Magda Bernheim, NP 4:10 PM [facility]

## 2021-04-18 ENCOUNTER — Other Ambulatory Visit: Payer: Self-pay | Admitting: Nurse Practitioner

## 2021-04-18 ENCOUNTER — Other Ambulatory Visit: Payer: Self-pay

## 2021-04-18 ENCOUNTER — Encounter: Payer: Self-pay | Admitting: Nurse Practitioner

## 2021-04-18 ENCOUNTER — Ambulatory Visit (INDEPENDENT_AMBULATORY_CARE_PROVIDER_SITE_OTHER): Payer: 59 | Admitting: Nurse Practitioner

## 2021-04-18 VITALS — BP 140/86 | HR 83 | Temp 97.9°F | Wt 169.0 lb

## 2021-04-18 DIAGNOSIS — K2100 Gastro-esophageal reflux disease with esophagitis, without bleeding: Secondary | ICD-10-CM

## 2021-04-18 DIAGNOSIS — E782 Mixed hyperlipidemia: Secondary | ICD-10-CM | POA: Diagnosis not present

## 2021-04-18 DIAGNOSIS — R7309 Other abnormal glucose: Secondary | ICD-10-CM

## 2021-04-18 DIAGNOSIS — E559 Vitamin D deficiency, unspecified: Secondary | ICD-10-CM

## 2021-04-18 DIAGNOSIS — M1A9XX0 Chronic gout, unspecified, without tophus (tophi): Secondary | ICD-10-CM

## 2021-04-18 DIAGNOSIS — N179 Acute kidney failure, unspecified: Secondary | ICD-10-CM | POA: Diagnosis not present

## 2021-04-18 DIAGNOSIS — Z124 Encounter for screening for malignant neoplasm of cervix: Secondary | ICD-10-CM

## 2021-04-18 DIAGNOSIS — Z79899 Other long term (current) drug therapy: Secondary | ICD-10-CM

## 2021-04-18 DIAGNOSIS — I10 Essential (primary) hypertension: Secondary | ICD-10-CM

## 2021-04-18 MED ORDER — OZEMPIC (0.25 OR 0.5 MG/DOSE) 2 MG/1.5ML ~~LOC~~ SOPN: 0.5000 mg | PEN_INJECTOR | SUBCUTANEOUS | 2 refills | Status: DC

## 2021-04-18 NOTE — Patient Instructions (Signed)
Semaglutide Injection What is this medication? SEMAGLUTIDE (SEM a GLOO tide) treats type 2 diabetes. It works by increasing insulin levels in your body, which decreases your blood sugar (glucose). It also reduces the amount of sugar released into the blood and slows down your digestion. It can also be used to lower the risk of heart attack and stroke in people with type 2 diabetes. Changes to diet and exercise are often combined with this medication. This medicine may be used for other purposes; ask your health care provider or pharmacist if you have questions. COMMON BRAND NAME(S): OZEMPIC What should I tell my care team before I take this medication? They need to know if you have any of these conditions: Endocrine tumors (MEN 2) or if someone in your family had these tumors Eye disease, vision problems History of pancreatitis Kidney disease Stomach problems Thyroid cancer or if someone in your family had thyroid cancer An unusual or allergic reaction to semaglutide, other medications, foods, dyes, or preservatives Pregnant or trying to get pregnant Breast-feeding How should I use this medication? This medication is for injection under the skin of your upper leg (thigh), stomach area, or upper arm. It is given once every week (every 7 days). You will be taught how to prepare and give this medication. Use exactly as directed. Take your medication at regular intervals. Do not take it more often than directed. If you use this medication with insulin, you should inject this medication and the insulin separately. Do not mix them together. Do not give the injections right next to each other. Change (rotate) injection sites with each injection. It is important that you put your used needles and syringes in a special sharps container. Do not put them in a trash can. If you do not have a sharps container, call your pharmacist or care team to get one. A special MedGuide will be given to you by the  pharmacist with each prescription and refill. Be sure to read this information carefully each time. This medication comes with INSTRUCTIONS FOR USE. Ask your pharmacist for directions on how to use this medication. Read the information carefully. Talk to your pharmacist or care team if you have questions. Talk to your care team about the use of this medication in children. Special care may be needed. Overdosage: If you think you have taken too much of this medicine contact a poison control center or emergency room at once. NOTE: This medicine is only for you. Do not share this medicine with others. What if I miss a dose? If you miss a dose, take it as soon as you can within 5 days after the missed dose. Then take your next dose at your regular weekly time. If it has been longer than 5 days after the missed dose, do not take the missed dose. Take the next dose at your regular time. Do not take double or extra doses. If you have questions about a missed dose, contact your care team for advice. What may interact with this medication? Other medications for diabetes Many medications may cause changes in blood sugar, these include: Alcohol containing beverages Antiviral medications for HIV or AIDS Aspirin and aspirin-like medications Certain medications for blood pressure, heart disease, irregular heart beat Chromium Diuretics Female hormones, such as estrogens or progestins, birth control pills Fenofibrate Gemfibrozil Isoniazid Lanreotide Female hormones or anabolic steroids MAOIs like Carbex, Eldepryl, Marplan, Nardil, and Parnate Medications for weight loss Medications for allergies, asthma, cold, or cough Medications for depression,   anxiety, or psychotic disturbances Niacin Nicotine NSAIDs, medications for pain and inflammation, like ibuprofen or naproxen Octreotide Pasireotide Pentamidine Phenytoin Probenecid Quinolone antibiotics such as ciprofloxacin, levofloxacin, ofloxacin Some  herbal dietary supplements Steroid medications such as prednisone or cortisone Sulfamethoxazole; trimethoprim Thyroid hormones Some medications can hide the warning symptoms of low blood sugar (hypoglycemia). You may need to monitor your blood sugar more closely if you are taking one of these medications. These include: Beta-blockers, often used for high blood pressure or heart problems (examples include atenolol, metoprolol, propranolol) Clonidine Guanethidine Reserpine This list may not describe all possible interactions. Give your health care provider a list of all the medicines, herbs, non-prescription drugs, or dietary supplements you use. Also tell them if you smoke, drink alcohol, or use illegal drugs. Some items may interact with your medicine. What should I watch for while using this medication? Visit your care team for regular checks on your progress. Drink plenty of fluids while taking this medication. Check with your care team if you get an attack of severe diarrhea, nausea, and vomiting. The loss of too much body fluid can make it dangerous for you to take this medication. A test called the HbA1C (A1C) will be monitored. This is a simple blood test. It measures your blood sugar control over the last 2 to 3 months. You will receive this test every 3 to 6 months. Learn how to check your blood sugar. Learn the symptoms of low and high blood sugar and how to manage them. Always carry a quick-source of sugar with you in case you have symptoms of low blood sugar. Examples include hard sugar candy or glucose tablets. Make sure others know that you can choke if you eat or drink when you develop serious symptoms of low blood sugar, such as seizures or unconsciousness. They must get medical help at once. Tell your care team if you have high blood sugar. You might need to change the dose of your medication. If you are sick or exercising more than usual, you might need to change the dose of your  medication. Do not skip meals. Ask your care team if you should avoid alcohol. Many nonprescription cough and cold products contain sugar or alcohol. These can affect blood sugar. Pens should never be shared. Even if the needle is changed, sharing may result in passing of viruses like hepatitis or HIV. Wear a medical ID bracelet or chain, and carry a card that describes your disease and details of your medication and dosage times. Do not become pregnant while taking this medication. Women should inform their care team if they wish to become pregnant or think they might be pregnant. There is a potential for serious side effects to an unborn child. Talk to your care team for more information. What side effects may I notice from receiving this medication? Side effects that you should report to your care team as soon as possible: Allergic reactions-skin rash, itching, hives, swelling of the face, lips, tongue, or throat Change in vision Dehydration-increased thirst, dry mouth, feeling faint or lightheaded, headache, dark yellow or brown urine Gallbladder problems-severe stomach pain, nausea, vomiting, fever Heart palpitations-rapid, pounding, or irregular heartbeat Kidney injury-decrease in the amount of urine, swelling of the ankles, hands, or feet Pancreatitis-severe stomach pain that spreads to your back or gets worse after eating or when touched, fever, nausea, vomiting Thyroid cancer-new mass or lump in the neck, pain or trouble swallowing, trouble breathing, hoarseness Side effects that usually do not require medical  attention (report to your care team if they continue or are bothersome): Diarrhea Loss of appetite Nausea Stomach pain Vomiting This list may not describe all possible side effects. Call your doctor for medical advice about side effects. You may report side effects to FDA at 1-800-FDA-1088. Where should I keep my medication? Keep out of the reach of children. Store unopened  pens in a refrigerator between 2 and 8 degrees C (36 and 46 degrees F). Do not freeze. Protect from light and heat. After you first use the pen, it can be stored for 56 days at room temperature between 15 and 30 degrees C (59 and 86 degrees F) or in a refrigerator. Throw away your used pen after 56 days or after the expiration date, whichever comes first. Do not store your pen with the needle attached. If the needle is left on, medication may leak from the pen. NOTE: This sheet is a summary. It may not cover all possible information. If you have questions about this medicine, talk to your doctor, pharmacist, or health care provider.  2022 Elsevier/Gold Standard (2020-08-13 16:01:53)

## 2021-04-19 LAB — CBC WITH DIFFERENTIAL/PLATELET
Absolute Monocytes: 1015 cells/uL — ABNORMAL HIGH (ref 200–950)
Basophils Absolute: 146 cells/uL (ref 0–200)
Basophils Relative: 1.7 %
Eosinophils Absolute: 370 cells/uL (ref 15–500)
Eosinophils Relative: 4.3 %
HCT: 38.4 % (ref 35.0–45.0)
Hemoglobin: 13.1 g/dL (ref 11.7–15.5)
Lymphs Abs: 2597 cells/uL (ref 850–3900)
MCH: 33.2 pg — ABNORMAL HIGH (ref 27.0–33.0)
MCHC: 34.1 g/dL (ref 32.0–36.0)
MCV: 97.5 fL (ref 80.0–100.0)
MPV: 10.7 fL (ref 7.5–12.5)
Monocytes Relative: 11.8 %
Neutro Abs: 4472 cells/uL (ref 1500–7800)
Neutrophils Relative %: 52 %
Platelets: 285 10*3/uL (ref 140–400)
RBC: 3.94 10*6/uL (ref 3.80–5.10)
RDW: 12.8 % (ref 11.0–15.0)
Total Lymphocyte: 30.2 %
WBC: 8.6 10*3/uL (ref 3.8–10.8)

## 2021-04-19 LAB — COMPLETE METABOLIC PANEL WITH GFR
AG Ratio: 1.6 (calc) (ref 1.0–2.5)
ALT: 33 U/L — ABNORMAL HIGH (ref 6–29)
AST: 41 U/L — ABNORMAL HIGH (ref 10–35)
Albumin: 4.3 g/dL (ref 3.6–5.1)
Alkaline phosphatase (APISO): 99 U/L (ref 37–153)
BUN: 13 mg/dL (ref 7–25)
CO2: 31 mmol/L (ref 20–32)
Calcium: 10 mg/dL (ref 8.6–10.4)
Chloride: 103 mmol/L (ref 98–110)
Creat: 0.9 mg/dL (ref 0.50–1.05)
Globulin: 2.7 g/dL (calc) (ref 1.9–3.7)
Glucose, Bld: 94 mg/dL (ref 65–99)
Potassium: 3.8 mmol/L (ref 3.5–5.3)
Sodium: 143 mmol/L (ref 135–146)
Total Bilirubin: 0.3 mg/dL (ref 0.2–1.2)
Total Protein: 7 g/dL (ref 6.1–8.1)
eGFR: 73 mL/min/{1.73_m2} (ref >=60)

## 2021-04-19 LAB — LIPID PANEL
Cholesterol: 154 mg/dL (ref <200)
HDL: 67 mg/dL (ref >=50)
LDL Cholesterol (Calc): 62 mg/dL (calc)
Non-HDL Cholesterol (Calc): 87 mg/dL (calc) (ref <130)
Total CHOL/HDL Ratio: 2.3 (calc) (ref <5.0)
Triglycerides: 174 mg/dL — ABNORMAL HIGH (ref <150)

## 2021-04-19 LAB — HEMOGLOBIN A1C
Hgb A1c MFr Bld: 5.5 % of total Hgb (ref <5.7)
Mean Plasma Glucose: 111 mg/dL
eAG (mmol/L): 6.2 mmol/L

## 2021-04-19 LAB — TSH: TSH: 1.32 mIU/L (ref 0.40–4.50)

## 2021-04-22 LAB — PAP, TP IMAGING W/ HPV RNA, RFLX HPV TYPE 16,18/45: HPV DNA High Risk: NOT DETECTED

## 2021-04-22 LAB — PAP, TP IMAGING, WNL RFLX HPV

## 2021-05-06 ENCOUNTER — Other Ambulatory Visit: Payer: Self-pay | Admitting: Nurse Practitioner

## 2021-05-06 DIAGNOSIS — R7309 Other abnormal glucose: Secondary | ICD-10-CM

## 2021-05-06 MED ORDER — OZEMPIC (0.25 OR 0.5 MG/DOSE) 2 MG/1.5ML ~~LOC~~ SOPN: 0.5000 mg | PEN_INJECTOR | SUBCUTANEOUS | 3 refills | Status: DC

## 2021-05-07 NOTE — Telephone Encounter (Signed)
Can you check to see if she needs a prior auth, she shouldn't because I gave her a coupon

## 2021-05-09 ENCOUNTER — Other Ambulatory Visit: Payer: Self-pay | Admitting: Internal Medicine

## 2021-05-09 DIAGNOSIS — I10 Essential (primary) hypertension: Secondary | ICD-10-CM

## 2021-06-18 ENCOUNTER — Encounter: Payer: Self-pay | Admitting: Internal Medicine

## 2021-06-18 ENCOUNTER — Other Ambulatory Visit: Payer: Self-pay

## 2021-06-18 ENCOUNTER — Ambulatory Visit (INDEPENDENT_AMBULATORY_CARE_PROVIDER_SITE_OTHER): Payer: 59 | Admitting: Internal Medicine

## 2021-06-18 VITALS — BP 132/87 | HR 86 | Temp 98.0°F | Resp 17 | Ht 62.5 in | Wt 156.4 lb

## 2021-06-18 DIAGNOSIS — G4483 Primary cough headache: Secondary | ICD-10-CM | POA: Diagnosis not present

## 2021-06-18 DIAGNOSIS — J111 Influenza due to unidentified influenza virus with other respiratory manifestations: Secondary | ICD-10-CM | POA: Diagnosis not present

## 2021-06-18 DIAGNOSIS — R110 Nausea: Secondary | ICD-10-CM | POA: Diagnosis not present

## 2021-06-18 DIAGNOSIS — J40 Bronchitis, not specified as acute or chronic: Secondary | ICD-10-CM

## 2021-06-18 DIAGNOSIS — J329 Chronic sinusitis, unspecified: Secondary | ICD-10-CM

## 2021-06-18 LAB — POC INFLUENZA A&B (BINAX/QUICKVUE)
Influenza A, POC: POSITIVE — AB
Influenza B, POC: NEGATIVE

## 2021-06-18 LAB — POC COVID19 BINAXNOW: SARS Coronavirus 2 Ag: NEGATIVE

## 2021-06-18 MED ORDER — DEXAMETHASONE 4 MG PO TABS: ORAL_TABLET | ORAL | 0 refills | Status: DC

## 2021-06-18 MED ORDER — PSEUDOEPHEDRINE HCL ER 120 MG PO TB12: ORAL_TABLET | ORAL | 3 refills | Status: DC

## 2021-06-18 MED ORDER — ONDANSETRON HCL 8 MG PO TABS: ORAL_TABLET | ORAL | 1 refills | Status: DC

## 2021-06-18 NOTE — Progress Notes (Signed)
Future Appointments  Date Time Provider Department  09/17/2021  9:00 AM Magda Bernheim, NP [facility]  03/18/2022 12:45 PM Frann Rider, NP [facility]   History of Present Illness:     Patient is a very nice 60 yo MWF  w/hx/o HTN, HLD, Hx/Thalamic CVA, Gout , GERD  & Vit D  Deficiency whose husband (+) yesterday for Flu A and she presents with HA, Nausea, Dry cough & Myalgias.  Tested Negative for Covid  & (+) for Flu A.   Medications  Current Outpatient Medications (Endocrine & Metabolic):    [id number],4.56 or 0.5MG /DOS, (OZEMPIC, 0.25 OR 0.5 MG/DOSE,) 2 MG/1.5ML SOPN, Inject 0.5 mg into the skin once a week.  Current Outpatient Medications (Cardiovascular):    amLODipine (NORVASC) 5 MG tablet, Take 1 tablet Daily for BP  (goal <130/80)   atenolol (TENORMIN) 100 MG tablet, TAKE 1 TABLET BY MOUTH  DAILY FOR BLOOD PRESSURE   furosemide (LASIX) 40 MG tablet, TAKE 1 TABLET BY MOUTH  DAILY FOR BP AND FLUID  RETENTION/ ANKLE SWELLING   rosuvastatin (CRESTOR) 20 MG tablet, Take  1 tablet  Daily  for Cholesterol   (Dx: e78.5)   telmisartan (MICARDIS) 80 MG tablet, TAKE 1 TABLET DAILY FOR  BLOOD PRESSURE  Current Outpatient Medications (Respiratory):    montelukast (SINGULAIR) 10 MG tablet, TAKE 1 TABLET BY MOUTH  DAILY FOR ALLERGIES  Current Outpatient Medications (Analgesics):    allopurinol (ZYLOPRIM) 300 MG tablet, TAKE 1/2 TO 1 TAB BY MOUTH EVERY DAY TO PREVENT GOUT   aspirin EC 81 MG EC tablet, Take 1 tablet (81 mg total) by mouth daily. Swallow whole.   Current Outpatient Medications (Other):    acyclovir (ZOVIRAX) 400 MG tablet, TAKE 1 TABLET 2 X /DAY FOR FEVER BLISTERS (Patient taking differently: Take 400 mg by mouth 2 (two) times daily as needed (For fever blisters).)   Ascorbic Acid (VITAMIN C PO), Take 1 tablet by mouth daily.   cholecalciferol (VITAMIN D) 1000 units tablet, Take 1,000 Units by mouth daily.   gabapentin (NEURONTIN) 600 MG tablet, Take 1.5 tablets (900 mg total)  by mouth 3 (three) times daily.   MAGNESIUM PO, Take 2,000 mg by mouth daily.   milk thistle 175 MG tablet, Take 175 mg by mouth daily.   Omega-3 Fatty Acids (FISH OIL) 1000 MG CAPS, Take 1,000 mg by mouth daily.   pantoprazole (PROTONIX) 40 MG tablet, Take  1 tablet  Daily  to Prevent Heartburn & Acid Reflux   traZODone (DESYREL) 100 MG tablet, TAKE 1/2 TO 1 TABLET 1 HOUR BEFORE BEDTIME AS NEEDED FOR SLEEP  Problem list She has Hypertension; Environmental allergies; Anemia; Anxiety; Asthma; Gout; Hyperlipidemia; Vitamin D deficiency; History of ischemic vertebrobasilar artery thalamic stroke; Overweight (BMI 25.0-29.9); and GERD (gastroesophageal reflux disease) on their problem list.   Observations/Objective:  BP 132/87    Pulse 86    Temp 98 F (36.7 C)    Resp 17    Wt 156 lb 6.4 oz (70.9 kg)    LMP 08/14/2018    SpO2 98%    BMI 27.71 kg/m   Hacking tracheal cough . No stridor . No rash, cyanosis.  HEENT - WNL. Neck - supple.  Chest - few scattered dry rakes. Cor - Nl HS. RRR w/o sig MGR. PP 1(+). No edema. MS- FROM w/o deformities.  Gait Nl. Neuro -  Nl w/o focal abnormalities.  Assessment and Plan:  1. Influenza  2. Nausea  - ondansetron  8  MG tablet;  Take  1/2 to 1 tablet   3 to 4 times /day ( every 6 hours)   if needed for Nausea   Dispense: 30 tablet; Refill: 1  3. Sinobronchitis  - pseudoephedrine  120 MG 12 hr tablet;  Take  1 tablet  2 x /day (every 12 hours)  for Sinus & Chest Congestion   Dispense: 60 tablet; Refill: 3  - dexamethasone 4 MG tablet;  Take 1 tab 3 x day - 3 days, then 2 x day - 3 days, then 1 tab daily   Dispense: 20 tablet  4. Cough headache  - POC COVID-19 - POC Influenza A&B (Binax test)  Follow Up Instructions:        I discussed the assessment and treatment plan with the patient. The patient was provided an opportunity to ask questions and all were answered. The patient agreed with the plan and demonstrated an understanding of  the instructions.       The patient was advised to call back or seek an in-person evaluation if the symptoms worsen or if the condition fails to improve as anticipated.   Kirtland Bouchard, MD

## 2021-07-09 ENCOUNTER — Other Ambulatory Visit: Payer: Self-pay | Admitting: Nurse Practitioner

## 2021-07-09 ENCOUNTER — Encounter: Payer: Self-pay | Admitting: Nurse Practitioner

## 2021-07-19 ENCOUNTER — Encounter: Payer: Self-pay | Admitting: Adult Health

## 2021-07-22 MED ORDER — AMITRIPTYLINE HCL 25 MG PO TABS: 25.0000 mg | ORAL_TABLET | Freq: Every day | ORAL | 5 refills | Status: DC

## 2021-07-22 NOTE — Telephone Encounter (Signed)
Do you want to start pt on different medication ? Please advise

## 2021-07-29 ENCOUNTER — Other Ambulatory Visit: Payer: Self-pay | Admitting: Adult Health

## 2021-08-02 ENCOUNTER — Encounter: Payer: Self-pay | Admitting: Internal Medicine

## 2021-08-02 ENCOUNTER — Other Ambulatory Visit: Payer: Self-pay | Admitting: Nurse Practitioner

## 2021-08-02 DIAGNOSIS — B001 Herpesviral vesicular dermatitis: Secondary | ICD-10-CM

## 2021-08-02 MED ORDER — ACYCLOVIR 400 MG PO TABS: 400.0000 mg | ORAL_TABLET | Freq: Two times a day (BID) | ORAL | 2 refills | Status: DC | PRN

## 2021-09-05 ENCOUNTER — Encounter: Payer: 59 | Admitting: Adult Health Nurse Practitioner

## 2021-09-11 ENCOUNTER — Other Ambulatory Visit: Payer: Self-pay | Admitting: Internal Medicine

## 2021-09-11 DIAGNOSIS — I10 Essential (primary) hypertension: Secondary | ICD-10-CM

## 2021-09-11 DIAGNOSIS — E782 Mixed hyperlipidemia: Secondary | ICD-10-CM

## 2021-09-16 NOTE — Progress Notes (Signed)
Complete Physical ? ?Assessment and Plan: ? ?CPE ?Declined PAP today  ?Continue annual mammogram  ? ?Essential hypertension ?continue medications, DASH diet, exercise and monitor at home. Call if greater than 130/80. ?- CBC with Differential/Platelet ?- CMP/GFR ?- TSH ?- Urinalysis, Routine w reflex microscopic (not at [facility]) ?- Microalbumin / creatinine urine ratio ?- EKG 12-Lead- defer, had numerous with recent admission ? ? Anemia, unspecified anemia type ?- macrocytic, recent normal B12 ? Continue iron supp with Vitamin C and increase green leafy veggies ? ? Asthma, unspecified asthma severity, uncomplicated ?Continue singulair, albuterol PRN, avoid triggers.  ? ?Allergy, subsequent encounter ?Continue OTC allergy pills ? ?Abnormal Glucose ?Continue diet and exercise ?-A1c ? ? Hyperlipidemia ?LDL goal <70;  ?Continue low cholesterol diet and exercise.  ?Check lipid panel.  ?- Lipid panel ? ?Vitamin D deficiency ?- Vit D  25 hydroxy (rtn osteoporosis monitoring) ? ?Anxiety ?Reports mild; limited to driving; declined med; discussed counseling ?Stress management techniques discussed, increase water, good sleep hygiene discussed, increase exercise, and increase veggies.  ? ?GERD ?Well managed on current medications ?Discussed diet, avoiding triggers and other lifestyle changes ? ?Chronic gout without tophus, unspecified cause, unspecified site ?Continue allopurinol ?Diet discussed ?-     Uric acid ? ?History of thalamic CVA ?Completed 3 weeks of DAPT; now on aggressive lipid therapy and bASA ?Continue to follow with Neurology ?Control BP;  ? ?Overweight - BMI 25 ?Long discussion about weight loss, diet, and exercise ?Recommended diet heavy in fruits and veggies and low in animal meats, cheeses, and dairy products, appropriate calorie intake ?Discussed appropriate weight for height ?Continue Ozempic, will increase to 1 mg SQ QW- she gets prescription filled in [place]. ? ?Screening for hematuria or proteinuria ?-  Routine UA with reflex microscopic ?- Microalbumin / creatinine urine ratio ? ?Screening for ischemic heart disease ?- EKG ? ? ? ?No orders of the defined types were placed in this encounter. ? ? ? ?Discussed med's effects and SE's. Screening labs and tests as requested with regular follow-up as recommended. ?Future Appointments  ?Date Time Provider [place]  ?03/18/2022 12:45 PM McCue, Janett Billow, NP [facility] None  ?09/18/2022  9:00 AM Tannis Burstein, Townsend Roger, NP [facility] None  ? ? ?HPI ?61 y.o. female  presents for a complete physical. She has Hypertension; Environmental allergies; Anemia; Anxiety; Asthma; Gout; Hyperlipidemia; Vitamin D deficiency; History of ischemic vertebrobasilar artery thalamic stroke; Overweight (BMI 25.0-29.9); GERD (gastroesophageal reflux disease); Sinobronchitis; Influenza; Nausea; and Cough headache on their problem list.  ? ?She is married, no children, has cats, she works as Statistician, enjoys her job. ? ?Jan 2022 she presented with a 2 day hx/o total body right-sided numbness/paresthesias. Head CT scan found only chronic microvascular changes, but Brain MRI showed an acute Left Thalamic CVA and 2 mm right supraclinoid ICA aneurysm. Cardiac echo was negative. Patient was begun on DAPT with bASA /Plavix with recommendations for stopping the Plavix after 3 weeks. She was recommended aggressive lipid control. Continues to follow with Dr. Leonie Man. R ICA aneurysm was stable on 02/2021 CTA.  ? ?Asthma/allergies on singulaire and well controlled.  ? ?GERD on protonix with fully controlled sx;  ? ?BMI is Body mass index is 28.28 kg/m?., she has been working on diet, trying to avoid eating late at night, admits minimal exercise but planning to increase walking now that weather is improved. She has been on Ozempic and was getting it from [place]. ?Wt Readings from Last 3 Encounters:  ?09/17/21 154 lb 9.6  oz (70.1 kg)  ?06/18/21 156 lb 6.4 oz (70.9 kg)  ?04/18/21 169 lb (76.7 kg)  ? ?Her blood  pressure has been controlled at home, today their BP is BP: 108/72  ?BP Readings from Last 3 Encounters:  ?09/17/21 108/72  ?06/18/21 132/87  ?04/18/21 140/86  ? ? ?She does not workout, new building at work and has not been working out. She denies chest pain, shortness of breath, dizziness.  ? ?She is on cholesterol medication, recently on rosuvastatin 20 mg daily (new). Her cholesterol is not at goal of LDL <70. The cholesterol last visit was:   ?Lab Results  ?Component Value Date  ? CHOL 154 04/18/2021  ? HDL 67 04/18/2021  ? [place] 62 04/18/2021  ? TRIG 174 (H) 04/18/2021  ? CHOLHDL 2.3 04/18/2021  ? ?Last A1C in the office was:  ?Lab Results  ?Component Value Date  ? HGBA1C 5.5 04/18/2021  ? ?Patient is on Vitamin D supplement, taking  ?Lab Results  ?Component Value Date  ? VD25OH 51 09/17/2020  ? ?Patient is on allopurinol for gout and does not report a recent flare, she is on 1/2 of the allopurinol.   ?Lab Results  ?Component Value Date  ? LABURIC 4.2 09/17/2020  ? ?Recently macrocytic/hyperchromic without anemia on CBC;  is on a B complex, recent check:  ?Lab Results  ?Component Value Date  ? [id number] 1,399 (H) 07/13/2020  ? ? ? ? ?Current Medications:  ?Current Outpatient Medications on File Prior to Visit  ?Medication Sig  ? acyclovir (ZOVIRAX) 400 MG tablet Take 1 tablet (400 mg total) by mouth 2 (two) times daily as needed (For fever blisters).  ? allopurinol (ZYLOPRIM) 300 MG tablet TAKE 1/2 TO 1 TAB BY MOUTH EVERY DAY TO PREVENT GOUT  ? amitriptyline (ELAVIL) 25 MG tablet Take 1 tablet (25 mg total) by mouth at bedtime.  ? amLODipine (NORVASC) 5 MG tablet TAKE 1 TABLET BY MOUTH  DAILY FOR BLOOD PRESSURE  (GOAL &lt;130/80)  ? Ascorbic Acid (VITAMIN C PO) Take 1 tablet by mouth daily.  ? aspirin EC 81 MG EC tablet Take 1 tablet (81 mg total) by mouth daily. Swallow whole.  ? atenolol (TENORMIN) 100 MG tablet TAKE 1 TABLET BY MOUTH  DAILY FOR BLOOD PRESSURE  ? cholecalciferol (VITAMIN D) 1000 units  tablet Take 1,000 Units by mouth daily.  ? furosemide (LASIX) 40 MG tablet TAKE 1 TABLET BY MOUTH  DAILY FOR BP AND FLUID  RETENTION/ ANKLE SWELLING  ? gabapentin (NEURONTIN) 600 MG tablet Take 1.5 tablets (900 mg total) by mouth 3 (three) times daily.  ? MAGNESIUM PO Take 2,000 mg by mouth daily.  ? milk thistle 175 MG tablet Take 175 mg by mouth daily.  ? montelukast (SINGULAIR) 10 MG tablet TAKE 1 TABLET BY MOUTH  DAILY FOR ALLERGIES  ? Omega-3 Fatty Acids (FISH OIL) 1000 MG CAPS Take 1,000 mg by mouth daily.  ? ondansetron (ZOFRAN) 8 MG tablet Take  1/2 to 1 tablet   3 to 4 times /day ( every 6 hours)  if needed for Nausea  ? pantoprazole (PROTONIX) 40 MG tablet Take  1 tablet  Daily  to Prevent Heartburn & Acid Reflux  ? rosuvastatin (CRESTOR) 20 MG tablet TAKE 1 TABLET BY MOUTH  DAILY FOR CHOLESTEROL  ? telmisartan (MICARDIS) 80 MG tablet TAKE 1 TABLET BY MOUTH  DAILY FOR BLOOD PRESSURE  ? dexamethasone (DECADRON) 4 MG tablet Take 1 tab 3 x day - 3 days, then  2 x day - 3 days, then 1 tab daily (Patient not taking: Reported on 09/17/2021)  ? pseudoephedrine (SUDAFED) 120 MG 12 hr tablet Take  1 tablet  2 x /day (every 12 hours)  for Sinus & Chest Congestion (Patient not taking: Reported on 09/17/2021)  ? Semaglutide,0.25 or 0.'5MG'$ /DOS, (OZEMPIC, 0.25 OR 0.5 MG/DOSE,) 2 MG/1.5ML SOPN Inject 0.5 mg into the skin once a week.  ? traZODone (DESYREL) 100 MG tablet TAKE 1/2 TO 1 TABLET 1 HOUR BEFORE BEDTIME AS NEEDED FOR SLEEP (Patient not taking: Reported on 09/17/2021)  ? ?No current facility-administered medications on file prior to visit.  ? ? ?Patient Active Problem List  ? Diagnosis Date Noted  ? Sinobronchitis 06/18/2021  ? Influenza 06/18/2021  ? Nausea 06/18/2021  ? Cough headache 06/18/2021  ? History of ischemic vertebrobasilar artery thalamic stroke 09/14/2020  ? Overweight (BMI 25.0-29.9) 09/14/2020  ? GERD (gastroesophageal reflux disease) 09/14/2020  ? Gout 12/12/2015  ? Hyperlipidemia 12/12/2015  ?  Vitamin D deficiency 12/12/2015  ? Hypertension   ? Environmental allergies   ? Anemia   ? Anxiety   ? Asthma   ? ? ?Health Maintenance:   ?Immunization History  ?Administered Date(s) Administered  ? Influenza-Unspecifi

## 2021-09-17 ENCOUNTER — Encounter: Payer: Self-pay | Admitting: Nurse Practitioner

## 2021-09-17 ENCOUNTER — Ambulatory Visit (INDEPENDENT_AMBULATORY_CARE_PROVIDER_SITE_OTHER): Payer: 59 | Admitting: Nurse Practitioner

## 2021-09-17 ENCOUNTER — Other Ambulatory Visit: Payer: Self-pay

## 2021-09-17 VITALS — BP 108/72 | HR 78 | Temp 97.9°F | Ht 62.0 in | Wt 154.6 lb

## 2021-09-17 DIAGNOSIS — R7309 Other abnormal glucose: Secondary | ICD-10-CM

## 2021-09-17 DIAGNOSIS — Z0000 Encounter for general adult medical examination without abnormal findings: Secondary | ICD-10-CM | POA: Diagnosis not present

## 2021-09-17 DIAGNOSIS — E559 Vitamin D deficiency, unspecified: Secondary | ICD-10-CM

## 2021-09-17 DIAGNOSIS — M1A9XX0 Chronic gout, unspecified, without tophus (tophi): Secondary | ICD-10-CM

## 2021-09-17 DIAGNOSIS — Z0001 Encounter for general adult medical examination with abnormal findings: Secondary | ICD-10-CM

## 2021-09-17 DIAGNOSIS — E663 Overweight: Secondary | ICD-10-CM

## 2021-09-17 DIAGNOSIS — Z9109 Other allergy status, other than to drugs and biological substances: Secondary | ICD-10-CM

## 2021-09-17 DIAGNOSIS — D649 Anemia, unspecified: Secondary | ICD-10-CM

## 2021-09-17 DIAGNOSIS — J45909 Unspecified asthma, uncomplicated: Secondary | ICD-10-CM

## 2021-09-17 DIAGNOSIS — E782 Mixed hyperlipidemia: Secondary | ICD-10-CM

## 2021-09-17 DIAGNOSIS — I10 Essential (primary) hypertension: Secondary | ICD-10-CM | POA: Diagnosis not present

## 2021-09-17 DIAGNOSIS — Z1389 Encounter for screening for other disorder: Secondary | ICD-10-CM

## 2021-09-17 DIAGNOSIS — Z136 Encounter for screening for cardiovascular disorders: Secondary | ICD-10-CM

## 2021-09-17 DIAGNOSIS — Z8673 Personal history of transient ischemic attack (TIA), and cerebral infarction without residual deficits: Secondary | ICD-10-CM

## 2021-09-17 DIAGNOSIS — Z79899 Other long term (current) drug therapy: Secondary | ICD-10-CM

## 2021-09-17 DIAGNOSIS — K219 Gastro-esophageal reflux disease without esophagitis: Secondary | ICD-10-CM

## 2021-09-17 MED ORDER — OZEMPIC (1 MG/DOSE) 4 MG/3ML ~~LOC~~ SOPN: 1.0000 mg | PEN_INJECTOR | SUBCUTANEOUS | Status: DC

## 2021-09-17 NOTE — Patient Instructions (Signed)
Semaglutide Injection ?What is this medication? ?SEMAGLUTIDE (SEM a GLOO tide) treats type 2 diabetes. It works by increasing insulin levels in your body, which decreases your blood sugar (glucose). It also reduces the amount of sugar released into the blood and slows down your digestion. It can also be used to lower the risk of heart attack and stroke in people with type 2 diabetes. Changes to diet and exercise are often combined with this medication. ?This medicine may be used for other purposes; ask your health care provider or pharmacist if you have questions. ?COMMON BRAND NAME(S): OZEMPIC ?What should I tell my care team before I take this medication? ?They need to know if you have any of these conditions: ?Endocrine tumors (MEN 2) or if someone in your family had these tumors ?Eye disease, vision problems ?History of pancreatitis ?Kidney disease ?Stomach problems ?Thyroid cancer or if someone in your family had thyroid cancer ?An unusual or allergic reaction to semaglutide, other medications, foods, dyes, or preservatives ?Pregnant or trying to get pregnant ?Breast-feeding ?How should I use this medication? ?This medication is for injection under the skin of your upper leg (thigh), stomach area, or upper arm. It is given once every week (every 7 days). You will be taught how to prepare and give this medication. Use exactly as directed. Take your medication at regular intervals. Do not take it more often than directed. ?If you use this medication with insulin, you should inject this medication and the insulin separately. Do not mix them together. Do not give the injections right next to each other. Change (rotate) injection sites with each injection. ?It is important that you put your used needles and syringes in a special sharps container. Do not put them in a trash can. If you do not have a sharps container, call your pharmacist or care team to get one. ?A special MedGuide will be given to you by the  pharmacist with each prescription and refill. Be sure to read this information carefully each time. ?This medication comes with INSTRUCTIONS FOR USE. Ask your pharmacist for directions on how to use this medication. Read the information carefully. Talk to your pharmacist or care team if you have questions. ?Talk to your care team about the use of this medication in children. Special care may be needed. ?Overdosage: If you think you have taken too much of this medicine contact a poison control center or emergency room at once. ?NOTE: This medicine is only for you. Do not share this medicine with others. ?What if I miss a dose? ?If you miss a dose, take it as soon as you can within 5 days after the missed dose. Then take your next dose at your regular weekly time. If it has been longer than 5 days after the missed dose, do not take the missed dose. Take the next dose at your regular time. Do not take double or extra doses. If you have questions about a missed dose, contact your care team for advice. ?What may interact with this medication? ?Other medications for diabetes ?Many medications may cause changes in blood sugar, these include: ?Alcohol containing beverages ?Antiviral medications for HIV or AIDS ?Aspirin and aspirin-like medications ?Certain medications for blood pressure, heart disease, irregular heart beat ?Chromium ?Diuretics ?Female hormones, such as estrogens or progestins, birth control pills ?Fenofibrate ?Gemfibrozil ?Isoniazid ?Lanreotide ?Female hormones or anabolic steroids ?MAOIs like Carbex, Eldepryl, Marplan, Nardil, and Parnate ?Medications for weight loss ?Medications for allergies, asthma, cold, or cough ?Medications for depression,   anxiety, or psychotic disturbances ?Niacin ?Nicotine ?NSAIDs, medications for pain and inflammation, like ibuprofen or naproxen ?Octreotide ?Pasireotide ?Pentamidine ?Phenytoin ?Probenecid ?Quinolone antibiotics such as ciprofloxacin, levofloxacin, ofloxacin ?Some  herbal dietary supplements ?Steroid medications such as prednisone or cortisone ?Sulfamethoxazole; trimethoprim ?Thyroid hormones ?Some medications can hide the warning symptoms of low blood sugar (hypoglycemia). You may need to monitor your blood sugar more closely if you are taking one of these medications. These include: ?Beta-blockers, often used for high blood pressure or heart problems (examples include atenolol, metoprolol, propranolol) ?Clonidine ?Guanethidine ?Reserpine ?This list may not describe all possible interactions. Give your health care provider a list of all the medicines, herbs, non-prescription drugs, or dietary supplements you use. Also tell them if you smoke, drink alcohol, or use illegal drugs. Some items may interact with your medicine. ?What should I watch for while using this medication? ?Visit your care team for regular checks on your progress. ?Drink plenty of fluids while taking this medication. Check with your care team if you get an attack of severe diarrhea, nausea, and vomiting. The loss of too much body fluid can make it dangerous for you to take this medication. ?A test called the HbA1C (A1C) will be monitored. This is a simple blood test. It measures your blood sugar control over the last 2 to 3 months. You will receive this test every 3 to 6 months. ?Learn how to check your blood sugar. Learn the symptoms of low and high blood sugar and how to manage them. ?Always carry a quick-source of sugar with you in case you have symptoms of low blood sugar. Examples include hard sugar candy or glucose tablets. Make sure others know that you can choke if you eat or drink when you develop serious symptoms of low blood sugar, such as seizures or unconsciousness. They must get medical help at once. ?Tell your care team if you have high blood sugar. You might need to change the dose of your medication. If you are sick or exercising more than usual, you might need to change the dose of your  medication. ?Do not skip meals. Ask your care team if you should avoid alcohol. Many nonprescription cough and cold products contain sugar or alcohol. These can affect blood sugar. ?Pens should never be shared. Even if the needle is changed, sharing may result in passing of viruses like hepatitis or HIV. ?Wear a medical ID bracelet or chain, and carry a card that describes your disease and details of your medication and dosage times. ?Do not become pregnant while taking this medication. Women should inform their care team if they wish to become pregnant or think they might be pregnant. There is a potential for serious side effects to an unborn child. Talk to your care team for more information. ?What side effects may I notice from receiving this medication? ?Side effects that you should report to your care team as soon as possible: ?Allergic reactions--skin rash, itching, hives, swelling of the face, lips, tongue, or throat ?Change in vision ?Dehydration--increased thirst, dry mouth, feeling faint or lightheaded, headache, dark yellow or brown urine ?Gallbladder problems--severe stomach pain, nausea, vomiting, fever ?Heart palpitations--rapid, pounding, or irregular heartbeat ?Kidney injury--decrease in the amount of urine, swelling of the ankles, hands, or feet ?Pancreatitis--severe stomach pain that spreads to your back or gets worse after eating or when touched, fever, nausea, vomiting ?Thyroid cancer--new mass or lump in the neck, pain or trouble swallowing, trouble breathing, hoarseness ?Side effects that usually do not require medical   attention (report to your care team if they continue or are bothersome): ?Diarrhea ?Loss of appetite ?Nausea ?Stomach pain ?Vomiting ?This list may not describe all possible side effects. Call your doctor for medical advice about side effects. You may report side effects to FDA at 1-800-FDA-1088. ?Where should I keep my medication? ?Keep out of the reach of children. ?Store  unopened pens in a refrigerator between 2 and 8 degrees C (36 and 46 degrees F). Do not freeze. Protect from light and heat. After you first use the pen, it can be stored for 56 days at room temperature between 15 and

## 2021-09-18 ENCOUNTER — Other Ambulatory Visit: Payer: Self-pay | Admitting: Nurse Practitioner

## 2021-09-18 DIAGNOSIS — R8290 Unspecified abnormal findings in urine: Secondary | ICD-10-CM

## 2021-09-18 LAB — URINALYSIS, ROUTINE W REFLEX MICROSCOPIC
Bilirubin Urine: NEGATIVE
Glucose, UA: NEGATIVE
Hgb urine dipstick: NEGATIVE
Hyaline Cast: NONE SEEN /LPF
Ketones, ur: NEGATIVE
Nitrite: POSITIVE — AB
Protein, ur: NEGATIVE
RBC / HPF: NONE SEEN /HPF (ref 0–2)
Specific Gravity, Urine: 1.008 (ref 1.001–1.035)
pH: >=8.5 — AB (ref 5.0–8.0)

## 2021-09-18 LAB — CBC WITH DIFFERENTIAL/PLATELET
Absolute Monocytes: 1073 cells/uL — ABNORMAL HIGH (ref 200–950)
Basophils Absolute: 134 cells/uL (ref 0–200)
Basophils Relative: 0.9 %
Eosinophils Absolute: 417 cells/uL (ref 15–500)
Eosinophils Relative: 2.8 %
HCT: 35.4 % (ref 35.0–45.0)
Hemoglobin: 11.9 g/dL (ref 11.7–15.5)
Lymphs Abs: 2012 cells/uL (ref 850–3900)
MCH: 34.2 pg — ABNORMAL HIGH (ref 27.0–33.0)
MCHC: 33.6 g/dL (ref 32.0–36.0)
MCV: 101.7 fL — ABNORMAL HIGH (ref 80.0–100.0)
MPV: 10.7 fL (ref 7.5–12.5)
Monocytes Relative: 7.2 %
Neutro Abs: 11264 cells/uL — ABNORMAL HIGH (ref 1500–7800)
Neutrophils Relative %: 75.6 %
Platelets: 290 10*3/uL (ref 140–400)
RBC: 3.48 10*6/uL — ABNORMAL LOW (ref 3.80–5.10)
RDW: 13.4 % (ref 11.0–15.0)
Total Lymphocyte: 13.5 %
WBC: 14.9 10*3/uL — ABNORMAL HIGH (ref 3.8–10.8)

## 2021-09-18 LAB — MICROALBUMIN / CREATININE URINE RATIO
Creatinine, Urine: 29 mg/dL (ref 20–275)
Microalb Creat Ratio: 17 mcg/mg creat (ref <30)
Microalb, Ur: 0.5 mg/dL

## 2021-09-18 LAB — COMPLETE METABOLIC PANEL WITH GFR
AG Ratio: 1.4 (calc) (ref 1.0–2.5)
ALT: 28 U/L (ref 6–29)
AST: 33 U/L (ref 10–35)
Albumin: 4.2 g/dL (ref 3.6–5.1)
Alkaline phosphatase (APISO): 112 U/L (ref 37–153)
BUN: 12 mg/dL (ref 7–25)
CO2: 34 mmol/L — ABNORMAL HIGH (ref 20–32)
Calcium: 10.3 mg/dL (ref 8.6–10.4)
Chloride: 101 mmol/L (ref 98–110)
Creat: 0.9 mg/dL (ref 0.50–1.05)
Globulin: 3 g/dL (calc) (ref 1.9–3.7)
Glucose, Bld: 96 mg/dL (ref 65–99)
Potassium: 4.1 mmol/L (ref 3.5–5.3)
Sodium: 143 mmol/L (ref 135–146)
Total Bilirubin: 0.3 mg/dL (ref 0.2–1.2)
Total Protein: 7.2 g/dL (ref 6.1–8.1)
eGFR: 73 mL/min/{1.73_m2} (ref >=60)

## 2021-09-18 LAB — VITAMIN D 25 HYDROXY (VIT D DEFICIENCY, FRACTURES): Vit D, 25-Hydroxy: 97 ng/mL (ref 30–100)

## 2021-09-18 LAB — LIPID PANEL
Cholesterol: 181 mg/dL (ref <200)
HDL: 79 mg/dL (ref >=50)
LDL Cholesterol (Calc): 86 mg/dL (calc)
Non-HDL Cholesterol (Calc): 102 mg/dL (calc) (ref <130)
Total CHOL/HDL Ratio: 2.3 (calc) (ref <5.0)
Triglycerides: 74 mg/dL (ref <150)

## 2021-09-18 LAB — TSH: TSH: 1.22 mIU/L (ref 0.40–4.50)

## 2021-09-18 LAB — HEMOGLOBIN A1C
Hgb A1c MFr Bld: 5.3 % of total Hgb (ref <5.7)
Mean Plasma Glucose: 105 mg/dL
eAG (mmol/L): 5.8 mmol/L

## 2021-09-18 LAB — MAGNESIUM: Magnesium: 2.3 mg/dL (ref 1.5–2.5)

## 2021-09-18 MED ORDER — NITROFURANTOIN MONOHYD MACRO 100 MG PO CAPS: 100.0000 mg | ORAL_CAPSULE | Freq: Two times a day (BID) | ORAL | 0 refills | Status: AC

## 2021-09-20 LAB — URINE CULTURE
MICRO NUMBER:: 13165333
SPECIMEN QUALITY:: ADEQUATE

## 2021-12-20 ENCOUNTER — Other Ambulatory Visit: Payer: Self-pay | Admitting: Adult Health

## 2022-01-20 ENCOUNTER — Other Ambulatory Visit: Payer: Self-pay | Admitting: Adult Health

## 2022-02-13 ENCOUNTER — Encounter: Payer: Self-pay | Admitting: Adult Health

## 2022-02-13 MED ORDER — AMITRIPTYLINE HCL 50 MG PO TABS: 50.0000 mg | ORAL_TABLET | Freq: Every day | ORAL | 5 refills | Status: DC

## 2022-02-13 NOTE — Telephone Encounter (Signed)
I see during [place] 5/23 you talking about starting amitriptyline or Duloxetine, but I dont see when/where you started amitiptyline or discussed during 9/19

## 2022-03-11 IMAGING — MR MR HEAD W/O CM
8 of 10 series · 36 of 48 positions shown · non-contrast
Comparison: Head CT 07/12/2020

CLINICAL DATA: Right-sided numbness.

EXAM:
MRI HEAD WITHOUT CONTRAST
TECHNIQUE: Multiplanar, multiecho pulse sequences of the brain and surrounding
structures were obtained without intravenous contrast.

[Series 3: DWI · axial · 3.0mm · 1.09mm/px · z∈[-55,+77]mm · 9 of 90 slices shown (1 of 4)]
[im 1/90]
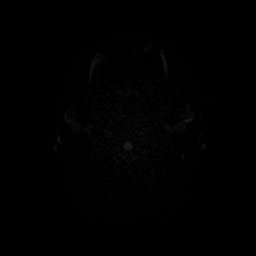
[im 12/90]
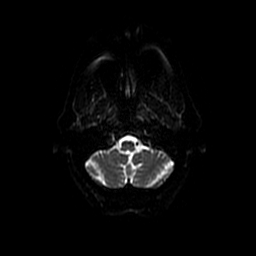
[im 23/90]
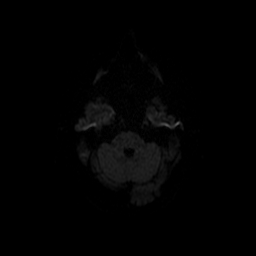
[im 34/90]
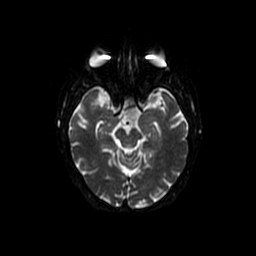
[im 45/90]
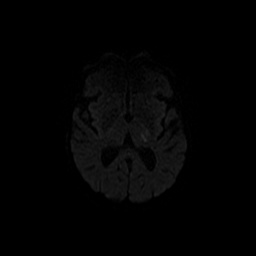
[im 56/90]
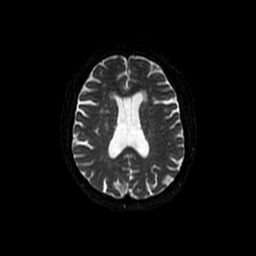
[im 67/90]
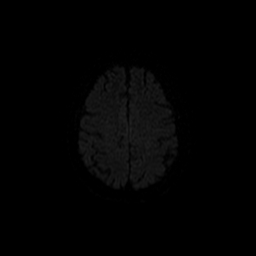
[im 78/90]
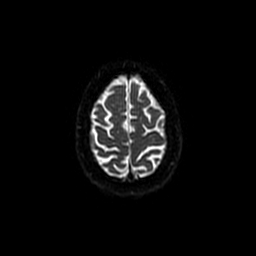
[im 90/90]
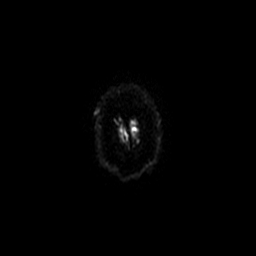

[Series 4: DWI · coronal · 5.0mm · 1.09mm/px · 7 of 64 slices shown (2 of 4)]
[im 1/64]
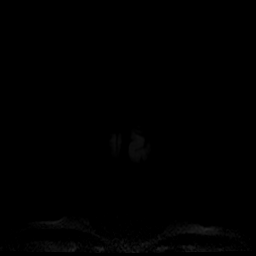
[im 11/64]
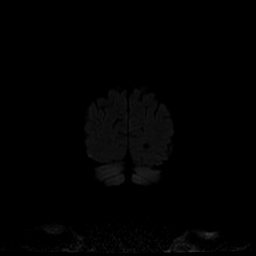
[im 22/64]
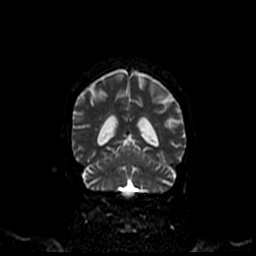
[im 32/64]
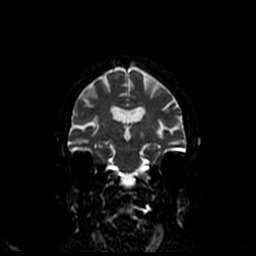
[im 43/64]
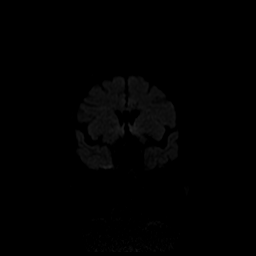
[im 53/64]
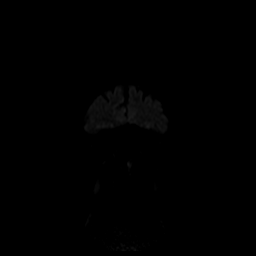
[im 64/64]
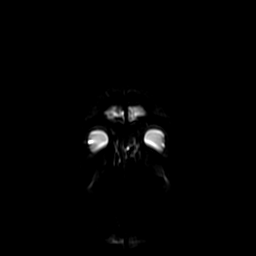

[Series 5: T1 · sagittal · 5.0mm · 0.47mm/px · 3 of 23 slices shown]
[im 1/23]
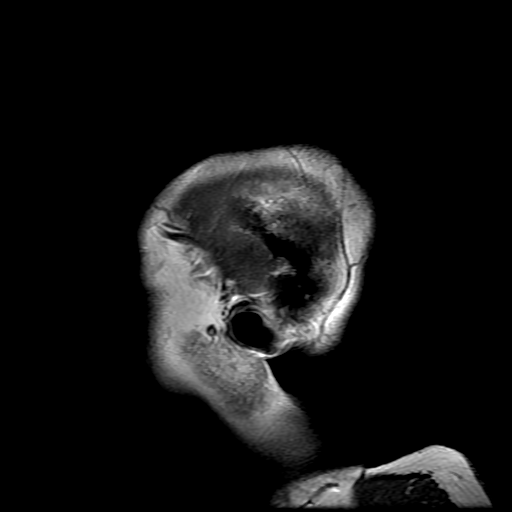
[im 12/23]
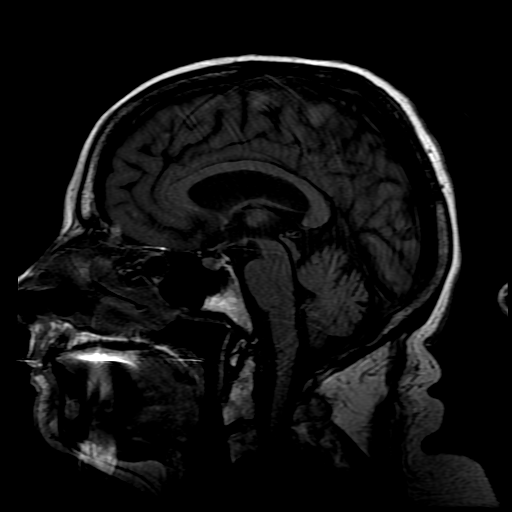
[im 23/23]
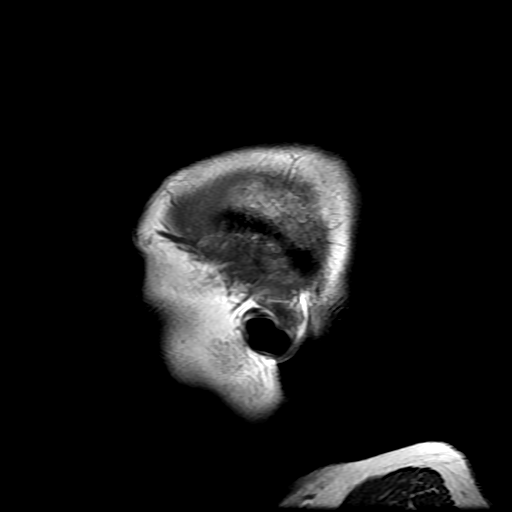

[Series 6: T2 · axial · 5.0mm · 0.43mm/px · z∈[-57,+80]mm · 3 of 24 slices shown (1 of 2)]
[im 1/24]
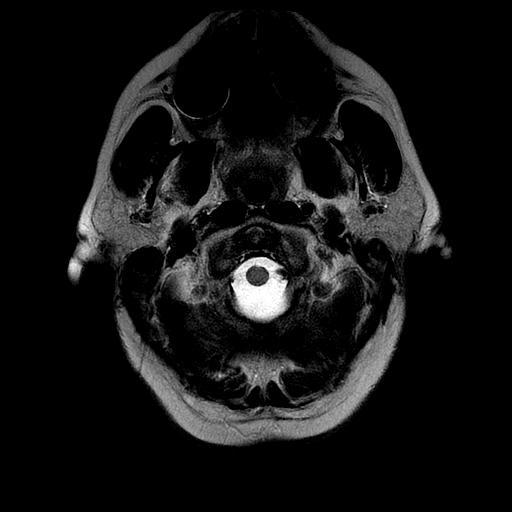
[im 12/24]
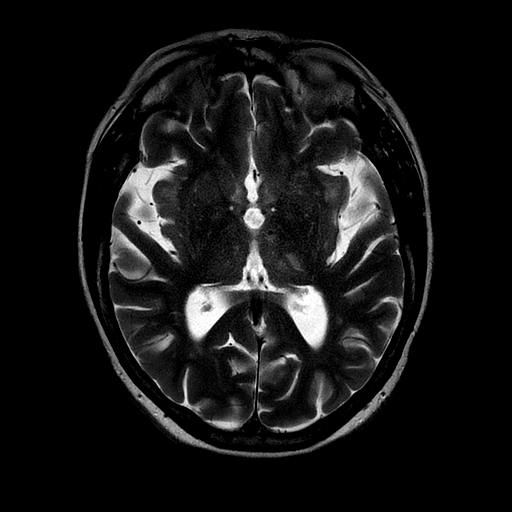
[im 24/24]
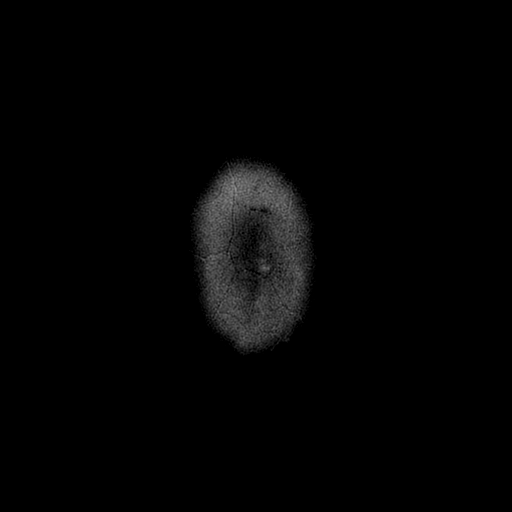

[Series 7: FLAIR · axial · 3.0mm · 0.43mm/px · z∈[-57,+80]mm · 3 of 24 slices shown]
[im 1/24]
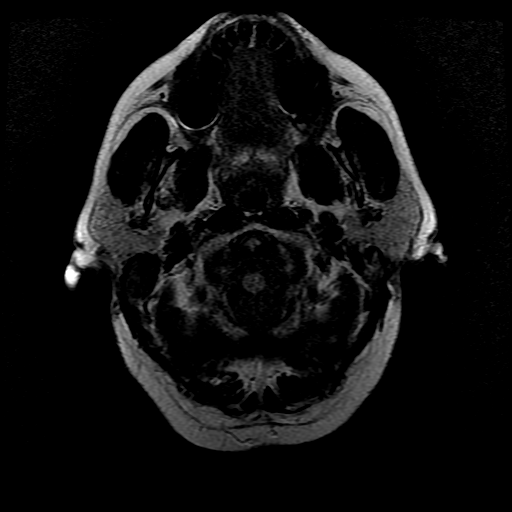
[im 12/24]
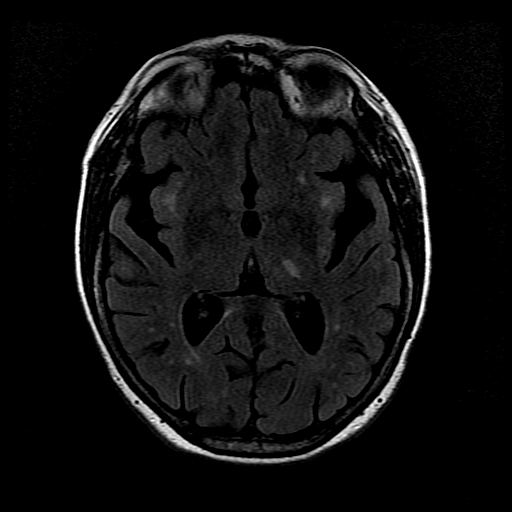
[im 24/24]
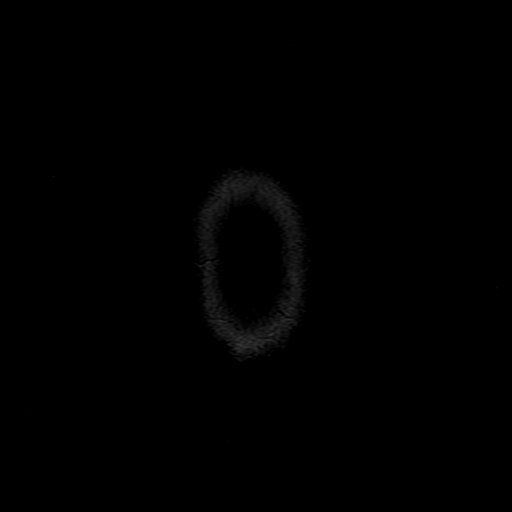

[Series 10: T2 · coronal · 5.0mm · 0.39mm/px · 3 of 25 slices shown (2 of 2)]
[im 1/25]
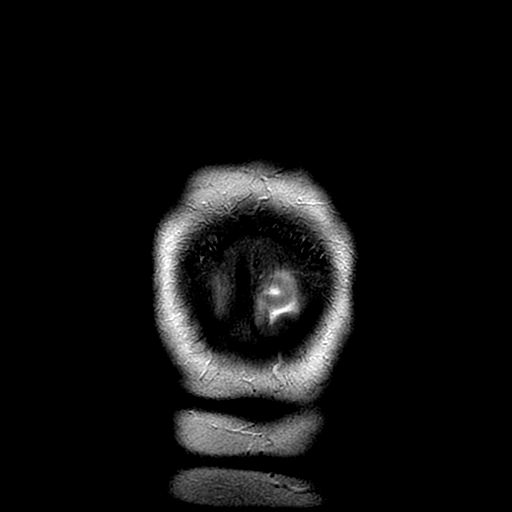
[im 13/25]
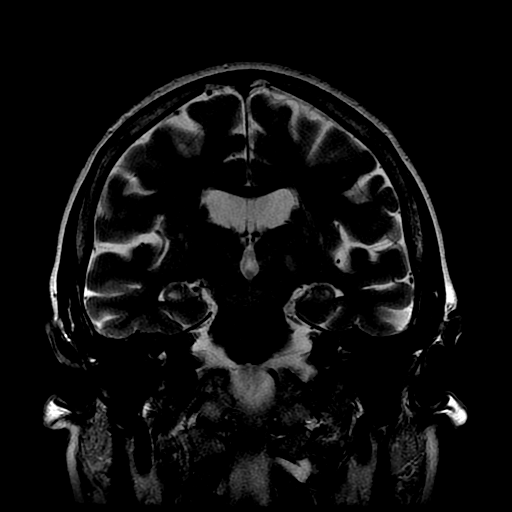
[im 25/25]
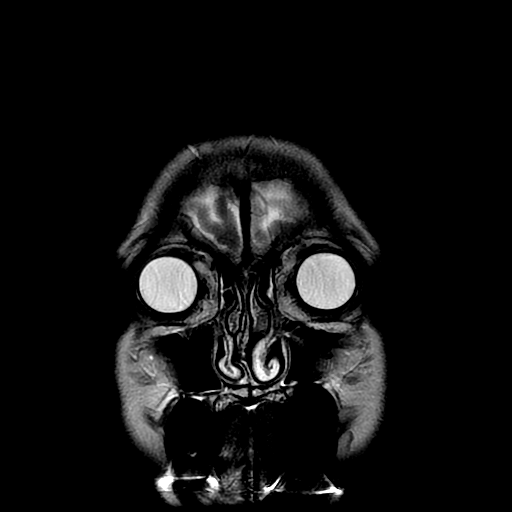

[Series 300: DWI · axial · 3.0mm · 1.09mm/px · z∈[-55,+77]mm · 5 of 45 slices shown (3 of 4)]
[im 1/45]
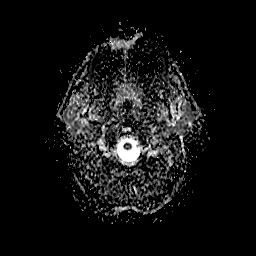
[im 12/45]
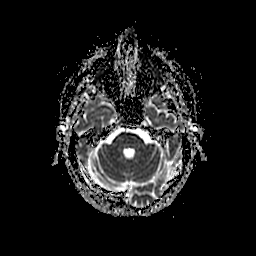
[im 23/45]
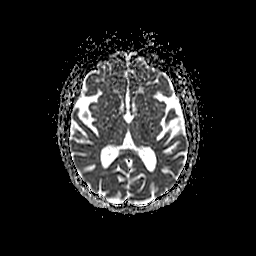
[im 34/45]
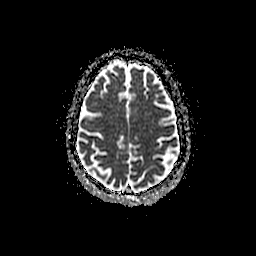
[im 45/45]
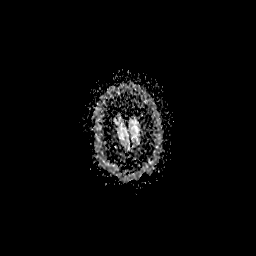

[Series 400: DWI · coronal · 5.0mm · 1.09mm/px · 3 of 32 slices shown (4 of 4)]
[im 1/32]
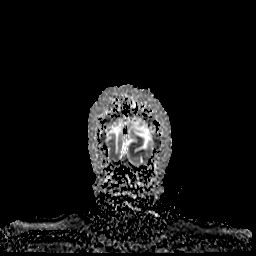
[im 16/32]
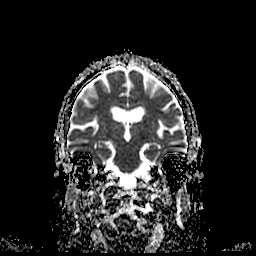
[im 32/32]
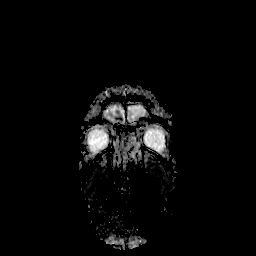

[36 of 48 positions shown; findings below may reference images not displayed]

FINDINGS: Brain: There is a 1 cm acute infarct laterally in the left thalamus.
No intracranial hemorrhage, mass, midline shift, or extra-axial
fluid collection is identified. T2 hyperintensity scattered
throughout the cerebral white matter bilaterally are nonspecific but
compatible with moderately age advanced chronic small vessel
ischemic disease. There is mild cerebral atrophy. A cavum septum
pellucidum et vergae is incidentally noted.

Vascular: Major intracranial vascular flow voids are preserved.

Skull and upper cervical spine: Unremarkable bone marrow signal.
Grade 1 anterolisthesis of C3 on C4 and C4 on C5.

Sinuses/Orbits: Unremarkable orbits. Minimal mucosal thickening in
the ethmoid sinuses. Clear mastoid air cells.

Other: None.
IMPRESSION: 1. Acute left thalamic infarct.
2. Moderate chronic small vessel ischemic disease.

## 2022-03-17 NOTE — Progress Notes (Unsigned)
[facility] [address] [place]. [place] [address] [phone]       STROKE FOLLOW UP NOTE  Valerie Hobbs Date of Birth:  July 18, 1960 Medical Record Number:  [id number]   Reason for Referral: stroke follow up    SUBJECTIVE:   CHIEF COMPLAINT:  No chief complaint on file.   HPI:   Update 03/18/2022 JM: Patient returns for 1 year stroke follow-up.  Has been having issues with right-sided dysesthesias, added amitriptyline back in January and increased dosage last month to 50 mg nightly.  Has remained on gabapentin 900 mg 3 times daily.  On aspirin and Crestor, denies side effects.  Blood pressure well controlled.  Closely followed by PCP.       History provided for reference purposes only Update 03/17/2021 JM: Valerie Hobbs returns for 62-monthstroke follow-up.  Overall doing well.  Reports improvements of right sided painful paresthesias after increasing gabapentin dosage currently taking 900 mg 3 times daily tolerating without side effects.  Denies new stroke/TIA symptoms. Remains on aspirin and Crestor '20mg'$  daily. Blood pressure today 133/85. Monitors at home and has been stable.  Recently completed CTA for surveillance monitoring of cerebral aneurysm which showed stable appearance but reports presence of small itchy red spots on right arm (side IV was placed on) the morning after. Denies rash or red spots elsewhere. Did feel some chest tightness while receiving contrast more so than normal (per patient) but shortly resolved.  Has follow-up visit scheduled next month with PCP for annual exam and plans on repeat lab work.  No further concerns at this time.  Update 11/19/2020 JM: Ms. WRaileyreturns for 62-monthtroke follow-up accompanied  She reports continued right arm and leg paresthesias with some improvement of her arm but not her leg.  She has continued on gabapentin 300-'600mg'$  TID which has been beneficial  But continues to experience pain  especially upon awakening in the morning.  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 Denies new stroke/TIA symptoms  Compliant on aspirin and Crestor without associated side effects Blood pressure today 137/81 -routinely monitors at home and typically 120s/80s Denies tobacco, EtOH or substance abuse  Currently being followed by Ortho for left shoulder subluxation and plans on starting PT 6/1 in hopes of avoiding surgical procedure  No further concerns at this time  Initial visit 08/20/2020 JM: she is being seen for hospital follow up. Reports residual right sided arm and leg numbness/tingling sensation as well as tightness sensation. Gradual improvement since discharge. Denies specific weakness. She does report burning sensation with increased ambulation. PCP rx'd gabapentin '100mg'$  1-2 cap TID without much benefit.  Denies new stroke/TIA symptoms.  She has since returned back to work as a leStatisticianithout great difficulty.  Completed 3 weeks DAPT and remains on aspirin '81mg'$  daily without side effects. On Crestor '20mg'$  daily without myalgias. Blood pressure today 166/98. Is planning on starting to monitor at home and has follow-up scheduled PCP 3/9 for follow-up evaluation.  Denies continued tobacco, excessive EtOH or substance abuse.  Stroke admission 07/13/2020 Valerie Hobbs a 89.o. female with history of hypertension, anxiety, and asthma, who [facility] of sudden onset of right-sided numbness.  Personally reviewed hospitalization pertinent progress notes, lab work and imaging.  Evaluated by Dr. SeLeonie Manith stroke work-up revealing acute left thalamic infarct likely due to small vessel  disease.  CTA head/neck unremarkable except for incidental finding of 34m right supraclinoid ICA aneurysm.  Recommended DAPT for 3 weeks then aspirin alone.  LDL 76.  A1c 5.3.  Other stroke  risk factors include HTN, EtOH use, tobacco use and substance abuse (THC).  Evaluated by therapies and discharged home in stable condition without therapy needs and residual right hemibody paresthesias.  Stroke: acute left thalamic infarct likely due to small vessel disease.  Resultant right hemibody paresthesias  code Stroke CT Head - not ordered CT head - No acute intracranial findings. Chronic microvascular ischemic change and cerebral volume loss.  MRI head - Acute left thalamic infarct. Moderate chronic small vessel ischemic disease.  MRA head - not ordered CTA H&N - No large vessel occlusion or significant stenosis in the head and neck. 2 mm right supraclinoid ICA aneurysm. Known acute left thalamic infarct.  2D Echo -normal ejection fraction without cardiac source of embolism. LDL - 76 HgbA1c - 5.3 UDS - THCU VTE prophylaxis - Lovenox No antithrombotic prior to admission, now on aspirin 81 mg daily and clopidogrel 75 mg daily for 3 weeks then aspirin alone Patient will be counseled to be compliant with her antithrombotic medications Ongoing aggressive stroke risk factor management Therapy recommendations: None  Disposition: Home     ROS:   14 system review of systems performed and negative with exception of those listed in HPI  PMH:  Past Medical History:  Diagnosis Date   Allergy    Anemia    Anxiety    Asthma    Hypertension    [place]    PSH:  Past Surgical History:  Procedure Laterality Date   NO PAST SURGERIES      Social History:  Social History   Socioeconomic History   Marital status: Married    Spouse name: Not on file   Number of children: Not on file   Years of education: Not on file   Highest education level: Not on file  Occupational History   Not on file  Tobacco Use   Smoking status: Never   Smokeless tobacco: Never  Vaping Use   Vaping Use: Never used  Substance and Sexual Activity   Alcohol use: Yes     Alcohol/week: 14.0 standard drinks of alcohol    Types: 14 Standard drinks or equivalent per week    Comment: daily   Drug use: Not Currently    Types: Marijuana    Comment: Quit 06/2020   Sexual activity: Yes    Partners: Male    Birth control/protection: Post-menopausal  Other Topics Concern   Not on file  Social History Narrative   Not on file   Social Determinants of Health   Financial Resource Strain: Not on file  Food Insecurity: Not on file  Transportation Needs: Not on file  Physical Activity: Not on file  Stress: Not on file  Social Connections: Not on file  Intimate Partner Violence: Not on file    Family History:  Family History  Problem Relation Age of Onset   Hypertension Mother    Diabetes Mother    Thyroid disease Mother    Cataracts Mother    Cirrhosis Mother        fatty liver   Diabetes Father    Hypertension Father    Heart disease Father        bypass   Dementia Father        LEWY BODY   Dementia Maternal Grandmother  Heart disease Maternal Grandmother    Cancer Paternal Grandmother     Medications:   Current Outpatient Medications on File Prior to Visit  Medication Sig Dispense Refill   acyclovir (ZOVIRAX) 400 MG tablet Take 1 tablet (400 mg total) by mouth 2 (two) times daily as needed (For fever blisters). 30 tablet 2   allopurinol (ZYLOPRIM) 300 MG tablet TAKE 1/2 TO 1 TAB BY MOUTH EVERY DAY TO PREVENT GOUT 30 tablet 8   amitriptyline (ELAVIL) 50 MG tablet Take 1 tablet (50 mg total) by mouth at bedtime. 30 tablet 5   amLODipine (NORVASC) 5 MG tablet TAKE 1 TABLET BY MOUTH  DAILY FOR BLOOD PRESSURE  (GOAL &lt;130/80) 90 tablet 3   Ascorbic Acid (VITAMIN C PO) Take 1 tablet by mouth daily.     aspirin EC 81 MG EC tablet Take 1 tablet (81 mg total) by mouth daily. Swallow whole. 30 tablet 11   atenolol (TENORMIN) 100 MG tablet TAKE 1 TABLET BY MOUTH  DAILY FOR BLOOD PRESSURE 90 tablet 3   cholecalciferol (VITAMIN D) 1000 units tablet Take  1,000 Units by mouth daily.     furosemide (LASIX) 40 MG tablet TAKE 1 TABLET BY MOUTH  DAILY FOR BP AND FLUID  RETENTION/ ANKLE SWELLING 90 tablet 3   gabapentin (NEURONTIN) 600 MG tablet TAKE 1 AND 1/2 TABLETS BY  MOUTH 3 TIMES DAILY 405 tablet 3   MAGNESIUM PO Take 2,000 mg by mouth daily.     milk thistle 175 MG tablet Take 175 mg by mouth daily.     montelukast (SINGULAIR) 10 MG tablet TAKE 1 TABLET BY MOUTH  DAILY FOR ALLERGIES 90 tablet 3   Omega-3 Fatty Acids (FISH OIL) 1000 MG CAPS Take 1,000 mg by mouth daily.     ondansetron (ZOFRAN) 8 MG tablet Take  1/2 to 1 tablet   3 to 4 times /day ( every 6 hours)  if needed for Nausea 30 tablet 1   pantoprazole (PROTONIX) 40 MG tablet Take  1 tablet  Daily  to Prevent Heartburn & Acid Reflux 90 tablet 3   rosuvastatin (CRESTOR) 20 MG tablet TAKE 1 TABLET BY MOUTH  DAILY FOR CHOLESTEROL 90 tablet 3   Semaglutide, 1 MG/DOSE, (OZEMPIC, 1 MG/DOSE,) 4 MG/3ML SOPN Inject 1 mg into the skin once a week. 9 mL    telmisartan (MICARDIS) 80 MG tablet TAKE 1 TABLET BY MOUTH  DAILY FOR BLOOD PRESSURE 90 tablet 3   No current facility-administered medications on file prior to visit.    Allergies:   Allergies  Allergen Reactions   Ciprofloxacin Nausea Only    Headache   Penicillins     Allergy has been more than 48yr but unsure of what reaction was      OBJECTIVE:  Physical Exam  There were no vitals filed for this visit.  There is no height or weight on file to calculate BMI. No results found.  General: well developed, well nourished, pleasant middle-age Caucasian female, seated, in no evident distress Head: head normocephalic and atraumatic.   Neck: supple with no carotid or supraclavicular bruits Cardiovascular: regular rate and rhythm, no murmurs Musculoskeletal: no deformity; limited left shoulder ROM Skin:  6-7 red raised bumps on right forearm Vascular:  Normal pulses all extremities   Neurologic Exam Mental Status: Awake and  fully alert. Fluent speech and language. Oriented to place and time. Recent and remote memory intact. Attention span, concentration and fund of knowledge appropriate. Mood and affect  appropriate.  Cranial Nerves: Pupils equal, briskly reactive to light. Extraocular movements full without nystagmus. Visual fields full to confrontation. Hearing intact. Facial sensation intact. Face, tongue, palate moves normally and symmetrically.  Motor: Normal bulk and tone. Normal strength in all tested extremity muscles except difficulty fully testing LUE d/t decreased shoulder ROM Sensory.: intact to touch , pinprick , position and vibratory sensation except mild " scratchiness sensation" with light touch RUE and RLE Coordination: Rapid alternating movements normal in all extremities.  Finger-to-nose and heel-to-shin performed accurately bilaterally Gait and Station: Arises from chair without difficulty. Stance is normal. Gait demonstrates normal stride length and balance without use of assistive device Reflexes: 1+ and symmetric. Toes downgoing.          ASSESSMENT: Valerie Hobbs is a 61 y.o. year old female with left thalamic infarct secondary to small vessel disease in 06/2020. Vascular risk factors include HTN, HLD, tobacco use, EtOH use and substance abuse. 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  Continue amitriptyline 50 mg nightly Continue gabapentin 900 mg 3 times daily -refill provided Continue aspirin 81 mg daily  and Crestor 20 mg daily for secondary stroke prevention.   Discussed secondary stroke prevention measures and importance of close PCP follow up for aggressive stroke risk factor management including BP goal<130/90, and HLD with LDL goal<70   R ICA aneurysm: CTA head reported 2 mm right supraclinoid ICA aneurysm.  Repeat CTA 02/2021 stable appearance.  Repeat CTA head/neck.   No prior history or family history of aneurysms.      Follow up in 1 year or call earlier if needed   CC:  [facility] provider: Dr. Armanda Magic, Gwyndolyn Saxon, MD    I spent 32 minutes of face-to-face and non-face-to-face time with patient.  This included previsit chart review, lab review, study review, electronic health record documentation, patient education and discussion regarding history of prior stroke, residual deficits, secondary stroke prevention measures and aggressive stroke risk factor management, cerebral aneurysm and answered all other questions to patient satisfaction   Frann Rider, AGNP-BC  [facility] [address] [place], [place] [address]  Phone [phone] Fax [phone] Note: This document was prepared with digital dictation and possible smart phrase technology. Any transcriptional errors that result from this process are unintentional.

## 2022-03-18 ENCOUNTER — Encounter: Payer: Self-pay | Admitting: Adult Health

## 2022-03-18 ENCOUNTER — Other Ambulatory Visit: Payer: Self-pay | Admitting: Nurse Practitioner

## 2022-03-18 ENCOUNTER — Ambulatory Visit (INDEPENDENT_AMBULATORY_CARE_PROVIDER_SITE_OTHER): Payer: 59 | Admitting: Adult Health

## 2022-03-18 ENCOUNTER — Telehealth: Payer: Self-pay | Admitting: Adult Health

## 2022-03-18 VITALS — BP 152/90 | HR 75 | Ht 62.0 in | Wt 153.5 lb

## 2022-03-18 DIAGNOSIS — I671 Cerebral aneurysm, nonruptured: Secondary | ICD-10-CM | POA: Diagnosis not present

## 2022-03-18 DIAGNOSIS — I6381 Other cerebral infarction due to occlusion or stenosis of small artery: Secondary | ICD-10-CM

## 2022-03-18 DIAGNOSIS — B001 Herpesviral vesicular dermatitis: Secondary | ICD-10-CM

## 2022-03-18 NOTE — Telephone Encounter (Signed)
[facility] NPR case #[id number] sent to GI

## 2022-03-18 NOTE — Patient Instructions (Addendum)
No changes today!   Continue gabapentin '900mg'$  three times daily and amitriptyline '50mg'$  nightly  You will be called to schedule imaging for monitoring of your cerebral aneurysm  Continue to follow closely with PCP for aggressive stroke risk factor management    Follow up in 1 year or call earlier if needed

## 2022-03-26 ENCOUNTER — Other Ambulatory Visit: Payer: Self-pay

## 2022-03-26 MED ORDER — ATENOLOL 100 MG PO TABS: ORAL_TABLET | ORAL | 3 refills | Status: DC

## 2022-03-31 NOTE — Progress Notes (Deleted)
Assessment and Plan:   Valerie Hobbs was seen today for follow-up.  Diagnoses and all orders for this visit:  Essential hypertension - continue medications, DASH diet, exercise and monitor at home. Call if greater than 130/80.   -     CBC with Differential/Platelet  Hyperlipidemia, mixed - Continue medication, diet and exercise -     COMPLETE METABOLIC PANEL WITH GFR -     Lipid panel -     TSH  Vitamin D deficiency  Continue Vit D supplementation  Medication management  Continued  Chronic gout without tophus, unspecified cause, unspecified site  Continue allopurinol  Continue dietary modifications  - Uric acid  Gastroesophageal reflux disease with esophagitis without hemorrhage  Continue Protonix and dietary modifications  Abnormal glucose -     Semaglutide,0.25 or 0.'5MG'$ /DOS, (OZEMPIC, 0.25 OR 0.5 MG/DOSE,) 2 MG/1.5ML SOPN; Inject 0.5 mg into the skin once a week. Continue diet and exercise.      Further disposition pending results of labs. Discussed med's effects and SE's.   Over 30 minutes of exam, counseling, chart review, and critical decision making was performed.   Future Appointments  Date Time Provider [place]  04/01/2022  9:30 AM Alycia Rossetti, NP [facility] None  04/15/2022 12:00 PM [facility] 1 [facility]  09/18/2022  9:00 AM Alycia Rossetti, NP [facility] None  03/24/2023  1:45 PM Frann Rider, NP [facility] None    ------------------------------------------------------------------------------------------------------------------   HPI 61 y.o.female with hx of htn, thalamic stroke in 06/2020 presents for follow up on htn , hyperlipidemia, AKI, chronic gout , GERD.   Labs on 11/13/20 showed AKI with drop GFR from 89  In Jan to 64 in Mar and on 5/17 GFR 35 . Lasix was reduced back down from 40 mg BID to once daily (had tolerated well previously) and Amlodipine 5 mg was added for BP. She is also on telmisartan 80 mg daily and atenolol 100  mg daily.   She was seen in ER on 03/30/21 for tree branch striking eye.  CT scan showed no evidence of globe rupture.  No foreign bodies were identified.  Upon the patient's return to exam room, tetracaine eyedrops and fluorescein stain were placed.  Eye was examined under Woods lamp.  No fluorescein uptake was identified.  Given the mechanism and the severity of the conjunctival hemorrhage, which did extend from 2:00 to 10:00 positions while the patient was in the ED, I did discuss this patient with ophthalmology.  Ophthalmology recommended ofloxacin eyedrops and follow-up as needed.  They did state that this will likely be self-limited and resolve without any need for follow-up.  These directions were relayed to the patient.  Ofloxacin eyedrops were prescribed.  Patient was discharged in stable condition. Eye is having no pain, completely healed.  No vision changes.   Her blood pressure has been controlled at home 135/80, today their BP is   BP Readings from Last 3 Encounters:  03/18/22 (!) 152/90  09/17/21 108/72  06/18/21 132/87     She does not workout. She denies chest pain, shortness of breath, dizziness.   Pt had stroke 07/12/20 and has residual right sided weakness and does have nerve pain on right side from arm and down body to foot. Gabapentin '900mg'$  TID is controlling pain.   BMI is There is no height or weight on file to calculate BMI., she has not been working on diet and exercise. Wt Readings from Last 3 Encounters:  03/18/22 153 lb 8 oz (  69.6 kg)  09/17/21 154 lb 9.6 oz (70.1 kg)  06/18/21 156 lb 6.4 oz (70.9 kg)    She has been pushing fluid intake, fills 45 fluid ounce jug with ice in AM and fills throughout day, getting 65+ fluid ounces daily.  Lab Results  Component Value Date   GFRNONAA 83 12/06/2020   GFRNONAA 35 (L) 11/13/2020   GFRNONAA 62 09/17/2020   Lab Results  Component Value Date   CREATININE 0.90 09/17/2021   CREATININE 0.90 04/18/2021   CREATININE 0.77  12/06/2020       Past Medical History:  Diagnosis Date   Allergy    Anemia    Anxiety    Asthma    Hypertension    Thalamic stroke ([place]) 07/13/2020     Allergies  Allergen Reactions   Ciprofloxacin Nausea Only    Headache   Penicillins     Allergy has been more than 88yr but unsure of what reaction was    Current Outpatient Medications on File Prior to Visit  Medication Sig   acyclovir (ZOVIRAX) 400 MG tablet TAKE 1 TABLET (400 MG TOTAL) BY MOUTH 2 (TWO) TIMES DAILY AS NEEDED (FOR FEVER BLISTERS).   allopurinol (ZYLOPRIM) 300 MG tablet TAKE 1/2 TO 1 TAB BY MOUTH EVERY DAY TO PREVENT GOUT   amitriptyline (ELAVIL) 50 MG tablet Take 1 tablet (50 mg total) by mouth at bedtime.   amLODipine (NORVASC) 5 MG tablet TAKE 1 TABLET BY MOUTH  DAILY FOR BLOOD PRESSURE  (GOAL &lt;130/80)   Ascorbic Acid (VITAMIN C PO) Take 1 tablet by mouth daily.   aspirin EC 81 MG EC tablet Take 1 tablet (81 mg total) by mouth daily. Swallow whole.   atenolol (TENORMIN) 100 MG tablet TAKE 1 TABLET BY MOUTH  DAILY FOR BLOOD PRESSURE   cholecalciferol (VITAMIN D) 1000 units tablet Take 1,000 Units by mouth daily.   furosemide (LASIX) 40 MG tablet TAKE 1 TABLET BY MOUTH  DAILY FOR BP AND FLUID  RETENTION/ ANKLE SWELLING   gabapentin (NEURONTIN) 600 MG tablet TAKE 1 AND 1/2 TABLETS BY  MOUTH 3 TIMES DAILY   MAGNESIUM PO Take 2,000 mg by mouth daily.   milk thistle 175 MG tablet Take 175 mg by mouth daily.   montelukast (SINGULAIR) 10 MG tablet TAKE 1 TABLET BY MOUTH  DAILY FOR ALLERGIES   Omega-3 Fatty Acids (FISH OIL) 1000 MG CAPS Take 1,000 mg by mouth daily.   ondansetron (ZOFRAN) 8 MG tablet Take  1/2 to 1 tablet   3 to 4 times /day ( every 6 hours)  if needed for Nausea   pantoprazole (PROTONIX) 40 MG tablet Take  1 tablet  Daily  to Prevent Heartburn & Acid Reflux   rosuvastatin (CRESTOR) 20 MG tablet TAKE 1 TABLET BY MOUTH  DAILY FOR CHOLESTEROL   Semaglutide, 1 MG/DOSE, (OZEMPIC, 1 MG/DOSE,) 4  MG/3ML SOPN Inject 1 mg into the skin once a week. (Patient not taking: Reported on 03/18/2022)   telmisartan (MICARDIS) 80 MG tablet TAKE 1 TABLET BY MOUTH  DAILY FOR BLOOD PRESSURE   No current facility-administered medications on file prior to visit.    Review of Systems  Constitutional:  Negative for chills, fever and weight loss.  HENT:  Negative for congestion and hearing loss.   Eyes:  Negative for blurred vision and double vision.  Respiratory:  Negative for cough and shortness of breath.   Cardiovascular:  Negative for chest pain, palpitations, orthopnea and leg swelling.  Gastrointestinal:  Negative for abdominal pain, constipation, diarrhea, heartburn, nausea and vomiting.  Genitourinary:  Negative for dysuria.  Musculoskeletal:  Negative for falls, joint pain and myalgias.  Skin:  Negative for rash.  Neurological:  Negative for dizziness, tingling, tremors, loss of consciousness and headaches.  Endo/Heme/Allergies:  Does not bruise/bleed easily.  Psychiatric/Behavioral:  Negative for depression, memory loss and suicidal ideas.       Physical Exam:  LMP 08/14/2018  General Appearance: Well nourished, in no apparent distress. Eyes: PERRLA, EOMs, conjunctiva no swelling or erythema Sinuses: No Frontal/maxillary tenderness ENT/Mouth: Ext aud canals clear, normal light reflex with TMs without erythema, bulging.  Good dentition. No erythema, swelling, or exudate on post pharynx. Tonsils not swollen or erythematous. Hearing normal.  Neck: Supple, thyroid normal. No bruits Respiratory: Respiratory effort normal, BS equal bilaterally without rales, rhonchi, wheezing or stridor. Cardio: RRR without murmurs, rubs or gallops. Brisk peripheral pulses without edema.  Chest: symmetric, with normal excursions and percussion. Breasts: getting mammograms, no concerns, defer Abdomen: Soft, +BS. Non tender, no guarding, rebound, hernias, masses, or organomegaly.  Lymphatics: Non tender  without lymphadenopathy.  Genitourinary:Normal external genitalia.  Normal-appearing vaginal vault and cervix.  Endocervical & exocervical Papanicolaou sample obtained.  Bimanual exam without abnormality.  Musculoskeletal: Full ROM all peripheral extremities and normal gait. 5/5 strength on left 4/5 strength right arm and leg. Skin: Warm, dry without rashes, lesions, ecchymosis.  Neuro: Cranial nerves intact, reflexes equal bilaterally. Normal muscle tone, no cerebellar symptoms. Sensation diminished RUE to monofilament, RLE absent to monofilament; intact to pressure.  Psych: Awake and oriented X 3, normal affect, Insight and Judgment appropriate.      Alycia Rossetti, NP 9:07 AM [facility]

## 2022-04-01 ENCOUNTER — Ambulatory Visit: Payer: 59 | Admitting: Nurse Practitioner

## 2022-04-13 NOTE — Progress Notes (Unsigned)
Assessment and Plan:   Valerie Hobbs was seen today for follow-up.  Diagnoses and all orders for this visit:  Essential hypertension - continue medications, DASH diet, exercise and monitor at home. Call if greater than 130/80.   -     CBC with Differential/Platelet  Hyperlipidemia, mixed - Continue medication, diet and exercise -     COMPLETE METABOLIC PANEL WITH GFR -     Lipid panel -     TSH  Vitamin D deficiency  Continue Vit D supplementation  Medication management  Continued  Chronic gout without tophus, unspecified cause, unspecified site  Continue allopurinol  Continue dietary modifications  - Uric acid  Gastroesophageal reflux disease with esophagitis without hemorrhage  Continue Protonix and dietary modifications  Abnormal glucose -     Semaglutide,0.25 or 0.'5MG'$ /DOS, (OZEMPIC, 0.25 OR 0.5 MG/DOSE,) 2 MG/1.5ML SOPN; Inject 0.5 mg into the skin once a week. Continue diet and exercise.  Overweight Long discussion about weight loss, diet, and exercise Recommended diet heavy in fruits and veggies and low in animal meats, cheeses, and dairy products, appropriate calorie intake Patient will work on *** Discussed appropriate weight for height (below***) and initial goal (***) Follow up at next visit      Further disposition pending results of labs. Discussed med's effects and SE's.   Over 30 minutes of exam, counseling, chart review, and critical decision making was performed.   Future Appointments  Date Time Provider [place]  04/14/2022  4:00 PM Alycia Rossetti, NP [facility] None  04/15/2022 12:00 PM [facility] 1 [facility]  09/18/2022  9:00 AM Alycia Rossetti, NP [facility] None  03/24/2023  1:45 PM Frann Rider, NP [facility] None    ------------------------------------------------------------------------------------------------------------------   HPI 61 y.o.female with hx of htn, thalamic stroke in 06/2020 presents for follow up on  htn , hyperlipidemia, AKI, chronic gout , GERD.   Labs on 11/13/20 showed AKI with drop GFR from 89  In Jan to 80 in Mar and on 5/17 GFR 35 . Lasix was reduced back down from 40 mg BID to once daily (had tolerated well previously) and Amlodipine 5 mg was added for BP. She is also on telmisartan 80 mg daily and atenolol 100 mg daily.   She was seen in ER on 03/30/21 for tree branch striking eye.  CT scan showed no evidence of globe rupture.  No foreign bodies were identified.  Upon the patient's return to exam room, tetracaine eyedrops and fluorescein stain were placed.  Eye was examined under Woods lamp.  No fluorescein uptake was identified.  Given the mechanism and the severity of the conjunctival hemorrhage, which did extend from 2:00 to 10:00 positions while the patient was in the ED, I did discuss this patient with ophthalmology.  Ophthalmology recommended ofloxacin eyedrops and follow-up as needed.  They did state that this will likely be self-limited and resolve without any need for follow-up.  These directions were relayed to the patient.  Ofloxacin eyedrops were prescribed.  Patient was discharged in stable condition. Eye is having no pain, completely healed.  No vision changes.   Her blood pressure has been controlled at home 135/80, today their BP is   BP Readings from Last 3 Encounters:  03/18/22 (!) 152/90  09/17/21 108/72  06/18/21 132/87     She does not workout. She denies chest pain, shortness of breath, dizziness.   Pt had stroke 07/12/20 and has residual right sided weakness and does have nerve pain on right side  from arm and down body to foot. Gabapentin '900mg'$  TID is controlling pain.   BMI is There is no height or weight on file to calculate BMI., she has not been working on diet and exercise. Wt Readings from Last 3 Encounters:  03/18/22 153 lb 8 oz (69.6 kg)  09/17/21 154 lb 9.6 oz (70.1 kg)  06/18/21 156 lb 6.4 oz (70.9 kg)    She has been pushing fluid intake, fills 45  fluid ounce jug with ice in AM and fills throughout day, getting 65+ fluid ounces daily.  Lab Results  Component Value Date   GFRNONAA 83 12/06/2020   GFRNONAA 35 (L) 11/13/2020   GFRNONAA 62 09/17/2020   Lab Results  Component Value Date   CREATININE 0.90 09/17/2021   CREATININE 0.90 04/18/2021   CREATININE 0.77 12/06/2020       Past Medical History:  Diagnosis Date   Allergy    Anemia    Anxiety    Asthma    Hypertension    Thalamic stroke ([place]) 07/13/2020     Allergies  Allergen Reactions   Ciprofloxacin Nausea Only    Headache   Penicillins     Allergy has been more than 8yr but unsure of what reaction was    Current Outpatient Medications on File Prior to Visit  Medication Sig   acyclovir (ZOVIRAX) 400 MG tablet TAKE 1 TABLET (400 MG TOTAL) BY MOUTH 2 (TWO) TIMES DAILY AS NEEDED (FOR FEVER BLISTERS).   allopurinol (ZYLOPRIM) 300 MG tablet TAKE 1/2 TO 1 TAB BY MOUTH EVERY DAY TO PREVENT GOUT   amitriptyline (ELAVIL) 50 MG tablet Take 1 tablet (50 mg total) by mouth at bedtime.   amLODipine (NORVASC) 5 MG tablet TAKE 1 TABLET BY MOUTH  DAILY FOR BLOOD PRESSURE  (GOAL &lt;130/80)   Ascorbic Acid (VITAMIN C PO) Take 1 tablet by mouth daily.   aspirin EC 81 MG EC tablet Take 1 tablet (81 mg total) by mouth daily. Swallow whole.   atenolol (TENORMIN) 100 MG tablet TAKE 1 TABLET BY MOUTH  DAILY FOR BLOOD PRESSURE   cholecalciferol (VITAMIN D) 1000 units tablet Take 1,000 Units by mouth daily.   furosemide (LASIX) 40 MG tablet TAKE 1 TABLET BY MOUTH  DAILY FOR BP AND FLUID  RETENTION/ ANKLE SWELLING   gabapentin (NEURONTIN) 600 MG tablet TAKE 1 AND 1/2 TABLETS BY  MOUTH 3 TIMES DAILY   MAGNESIUM PO Take 2,000 mg by mouth daily.   milk thistle 175 MG tablet Take 175 mg by mouth daily.   montelukast (SINGULAIR) 10 MG tablet TAKE 1 TABLET BY MOUTH  DAILY FOR ALLERGIES   Omega-3 Fatty Acids (FISH OIL) 1000 MG CAPS Take 1,000 mg by mouth daily.   ondansetron (ZOFRAN) 8 MG  tablet Take  1/2 to 1 tablet   3 to 4 times /day ( every 6 hours)  if needed for Nausea   pantoprazole (PROTONIX) 40 MG tablet Take  1 tablet  Daily  to Prevent Heartburn & Acid Reflux   rosuvastatin (CRESTOR) 20 MG tablet TAKE 1 TABLET BY MOUTH  DAILY FOR CHOLESTEROL   Semaglutide, 1 MG/DOSE, (OZEMPIC, 1 MG/DOSE,) 4 MG/3ML SOPN Inject 1 mg into the skin once a week. (Patient not taking: Reported on 03/18/2022)   telmisartan (MICARDIS) 80 MG tablet TAKE 1 TABLET BY MOUTH  DAILY FOR BLOOD PRESSURE   No current facility-administered medications on file prior to visit.    Review of Systems  Constitutional:  Negative for chills, fever  and weight loss.  HENT:  Negative for congestion and hearing loss.   Eyes:  Negative for blurred vision and double vision.  Respiratory:  Negative for cough and shortness of breath.   Cardiovascular:  Negative for chest pain, palpitations, orthopnea and leg swelling.  Gastrointestinal:  Negative for abdominal pain, constipation, diarrhea, heartburn, nausea and vomiting.  Genitourinary:  Negative for dysuria.  Musculoskeletal:  Negative for falls, joint pain and myalgias.  Skin:  Negative for rash.  Neurological:  Negative for dizziness, tingling, tremors, loss of consciousness and headaches.  Endo/Heme/Allergies:  Does not bruise/bleed easily.  Psychiatric/Behavioral:  Negative for depression, memory loss and suicidal ideas.       Physical Exam:  LMP 08/14/2018  General Appearance: Well nourished, in no apparent distress. Eyes: PERRLA, EOMs, conjunctiva no swelling or erythema Sinuses: No Frontal/maxillary tenderness ENT/Mouth: Ext aud canals clear, normal light reflex with TMs without erythema, bulging.  Good dentition. No erythema, swelling, or exudate on post pharynx. Tonsils not swollen or erythematous. Hearing normal.  Neck: Supple, thyroid normal. No bruits Respiratory: Respiratory effort normal, BS equal bilaterally without rales, rhonchi, wheezing or  stridor. Cardio: RRR without murmurs, rubs or gallops. Brisk peripheral pulses without edema.  Chest: symmetric, with normal excursions and percussion. Breasts: getting mammograms, no concerns, defer Abdomen: Soft, +BS. Non tender, no guarding, rebound, hernias, masses, or organomegaly.  Lymphatics: Non tender without lymphadenopathy.  Genitourinary:Normal external genitalia.  Normal-appearing vaginal vault and cervix.  Endocervical & exocervical Papanicolaou sample obtained.  Bimanual exam without abnormality.  Musculoskeletal: Full ROM all peripheral extremities and normal gait. 5/5 strength on left 4/5 strength right arm and leg. Skin: Warm, dry without rashes, lesions, ecchymosis.  Neuro: Cranial nerves intact, reflexes equal bilaterally. Normal muscle tone, no cerebellar symptoms. Sensation diminished RUE to monofilament, RLE absent to monofilament; intact to pressure.  Psych: Awake and oriented X 3, normal affect, Insight and Judgment appropriate.      Alycia Rossetti, NP 10:42 AM [facility]

## 2022-04-14 ENCOUNTER — Ambulatory Visit (INDEPENDENT_AMBULATORY_CARE_PROVIDER_SITE_OTHER): Payer: 59 | Admitting: Nurse Practitioner

## 2022-04-14 ENCOUNTER — Encounter: Payer: Self-pay | Admitting: Nurse Practitioner

## 2022-04-14 VITALS — BP 122/72 | HR 92 | Temp 97.7°F | Ht 62.0 in | Wt 157.6 lb

## 2022-04-14 DIAGNOSIS — I10 Essential (primary) hypertension: Secondary | ICD-10-CM

## 2022-04-14 DIAGNOSIS — E559 Vitamin D deficiency, unspecified: Secondary | ICD-10-CM

## 2022-04-14 DIAGNOSIS — M1A9XX0 Chronic gout, unspecified, without tophus (tophi): Secondary | ICD-10-CM

## 2022-04-14 DIAGNOSIS — R7309 Other abnormal glucose: Secondary | ICD-10-CM | POA: Diagnosis not present

## 2022-04-14 DIAGNOSIS — E782 Mixed hyperlipidemia: Secondary | ICD-10-CM

## 2022-04-14 DIAGNOSIS — Z79899 Other long term (current) drug therapy: Secondary | ICD-10-CM

## 2022-04-14 DIAGNOSIS — E663 Overweight: Secondary | ICD-10-CM

## 2022-04-14 DIAGNOSIS — K219 Gastro-esophageal reflux disease without esophagitis: Secondary | ICD-10-CM

## 2022-04-14 NOTE — Patient Instructions (Signed)

## 2022-04-15 ENCOUNTER — Ambulatory Visit: Payer: 59 | Admitting: Nurse Practitioner

## 2022-04-15 ENCOUNTER — Inpatient Hospital Stay: Admission: RE | Admit: 2022-04-15 | Payer: 59 | Source: Ambulatory Visit

## 2022-04-15 LAB — COMPLETE METABOLIC PANEL WITH GFR
AG Ratio: 1.5 (calc) (ref 1.0–2.5)
ALT: 44 U/L — ABNORMAL HIGH (ref 6–29)
AST: 54 U/L — ABNORMAL HIGH (ref 10–35)
Albumin: 4.4 g/dL (ref 3.6–5.1)
Alkaline phosphatase (APISO): 141 U/L (ref 37–153)
BUN/Creatinine Ratio: 11 (calc) (ref 6–22)
BUN: 12 mg/dL (ref 7–25)
CO2: 24 mmol/L (ref 20–32)
Calcium: 9.4 mg/dL (ref 8.6–10.4)
Chloride: 104 mmol/L (ref 98–110)
Creat: 1.09 mg/dL — ABNORMAL HIGH (ref 0.50–1.05)
Globulin: 2.9 g/dL (calc) (ref 1.9–3.7)
Glucose, Bld: 93 mg/dL (ref 65–99)
Potassium: 3.5 mmol/L (ref 3.5–5.3)
Sodium: 141 mmol/L (ref 135–146)
Total Bilirubin: 0.3 mg/dL (ref 0.2–1.2)
Total Protein: 7.3 g/dL (ref 6.1–8.1)
eGFR: 57 mL/min/{1.73_m2} — ABNORMAL LOW (ref >=60)

## 2022-04-15 LAB — CBC WITH DIFFERENTIAL/PLATELET
Absolute Monocytes: 1012 cells/uL — ABNORMAL HIGH (ref 200–950)
Basophils Absolute: 99 cells/uL (ref 0–200)
Basophils Relative: 0.9 %
Eosinophils Absolute: 407 cells/uL (ref 15–500)
Eosinophils Relative: 3.7 %
HCT: 34.8 % — ABNORMAL LOW (ref 35.0–45.0)
Hemoglobin: 12.4 g/dL (ref 11.7–15.5)
Lymphs Abs: 2288 cells/uL (ref 850–3900)
MCH: 35.1 pg — ABNORMAL HIGH (ref 27.0–33.0)
MCHC: 35.6 g/dL (ref 32.0–36.0)
MCV: 98.6 fL (ref 80.0–100.0)
MPV: 10.8 fL (ref 7.5–12.5)
Monocytes Relative: 9.2 %
Neutro Abs: 7194 cells/uL (ref 1500–7800)
Neutrophils Relative %: 65.4 %
Platelets: 266 10*3/uL (ref 140–400)
RBC: 3.53 10*6/uL — ABNORMAL LOW (ref 3.80–5.10)
RDW: 12.9 % (ref 11.0–15.0)
Total Lymphocyte: 20.8 %
WBC: 11 10*3/uL — ABNORMAL HIGH (ref 3.8–10.8)

## 2022-04-15 LAB — LIPID PANEL
Cholesterol: 179 mg/dL (ref <200)
HDL: 73 mg/dL (ref >=50)
LDL Cholesterol (Calc): 76 mg/dL (calc)
Non-HDL Cholesterol (Calc): 106 mg/dL (calc) (ref <130)
Total CHOL/HDL Ratio: 2.5 (calc) (ref <5.0)
Triglycerides: 200 mg/dL — ABNORMAL HIGH (ref <150)

## 2022-04-15 LAB — HEMOGLOBIN A1C
Hgb A1c MFr Bld: 5.9 % of total Hgb — ABNORMAL HIGH (ref <5.7)
Mean Plasma Glucose: 123 mg/dL
eAG (mmol/L): 6.8 mmol/L

## 2022-04-23 ENCOUNTER — Encounter: Payer: Self-pay | Admitting: Adult Health

## 2022-04-24 NOTE — Telephone Encounter (Signed)
If she is interested, we can increase amitriptyline to 75 mg nightly.

## 2022-05-13 MED ORDER — AMITRIPTYLINE HCL 100 MG PO TABS: 100.0000 mg | ORAL_TABLET | Freq: Every day | ORAL | 5 refills | Status: DC

## 2022-05-13 NOTE — Telephone Encounter (Signed)
She recently had an increase amitriptyline to 75 mg on 04/24/22. Is there an alternative she can try or another increase?

## 2022-05-19 ENCOUNTER — Ambulatory Visit
Admission: RE | Admit: 2022-05-19 | Discharge: 2022-05-19 | Disposition: A | Discharge status: Home / Self Care | Payer: 59 | Source: Ambulatory Visit | Attending: Adult Health | Admitting: Adult Health

## 2022-05-19 DIAGNOSIS — I671 Cerebral aneurysm, nonruptured: Secondary | ICD-10-CM

## 2022-05-19 MED ORDER — IOPAMIDOL (ISOVUE-370) INJECTION 76%
75.0000 mL | Freq: Once | INTRAVENOUS | Status: AC | PRN
  Administered 2022-05-19: 75 mL via INTRAVENOUS

## 2022-06-05 MED ORDER — TOPIRAMATE 25 MG PO TABS: 25.0000 mg | ORAL_TABLET | Freq: Two times a day (BID) | ORAL | 5 refills | Status: DC

## 2022-06-05 NOTE — Addendum Note (Signed)
Addended by: Frann Rider L on: 06/05/2022 04:05 PM   Modules accepted: Orders

## 2022-06-19 ENCOUNTER — Other Ambulatory Visit: Payer: Self-pay | Admitting: Nurse Practitioner

## 2022-06-19 DIAGNOSIS — B001 Herpesviral vesicular dermatitis: Secondary | ICD-10-CM

## 2022-07-07 ENCOUNTER — Other Ambulatory Visit: Payer: Self-pay

## 2022-07-07 MED ORDER — MONTELUKAST SODIUM 10 MG PO TABS: ORAL_TABLET | ORAL | 3 refills | Status: DC

## 2022-07-08 ENCOUNTER — Other Ambulatory Visit: Payer: Self-pay | Admitting: Adult Health

## 2022-07-10 ENCOUNTER — Encounter: Payer: Self-pay | Admitting: Nurse Practitioner

## 2022-07-21 ENCOUNTER — Other Ambulatory Visit: Payer: Self-pay | Admitting: Neurology

## 2022-07-21 ENCOUNTER — Encounter: Payer: Self-pay | Admitting: Adult Health

## 2022-07-21 ENCOUNTER — Other Ambulatory Visit: Payer: Self-pay | Admitting: Adult Health

## 2022-07-21 MED ORDER — TOPIRAMATE ER 50 MG PO CAP24: 50.0000 mg | ORAL_CAPSULE | Freq: Every day | ORAL | 5 refills | Status: DC

## 2022-08-13 ENCOUNTER — Encounter: Payer: Self-pay | Admitting: Nurse Practitioner

## 2022-08-19 ENCOUNTER — Other Ambulatory Visit: Payer: Self-pay | Admitting: Nurse Practitioner

## 2022-08-19 DIAGNOSIS — E782 Mixed hyperlipidemia: Secondary | ICD-10-CM

## 2022-09-08 ENCOUNTER — Other Ambulatory Visit: Payer: Self-pay | Admitting: Nurse Practitioner

## 2022-09-08 ENCOUNTER — Encounter: Payer: Self-pay | Admitting: Nurse Practitioner

## 2022-09-08 DIAGNOSIS — I10 Essential (primary) hypertension: Secondary | ICD-10-CM

## 2022-09-08 MED ORDER — FUROSEMIDE 40 MG PO TABS: ORAL_TABLET | ORAL | 0 refills | Status: DC

## 2022-09-08 NOTE — Progress Notes (Signed)
Needed 10 days of Furosemide before can get from mail order

## 2022-09-15 ENCOUNTER — Other Ambulatory Visit: Payer: Self-pay

## 2022-09-15 DIAGNOSIS — K2100 Gastro-esophageal reflux disease with esophagitis, without bleeding: Secondary | ICD-10-CM

## 2022-09-15 MED ORDER — PANTOPRAZOLE SODIUM 40 MG PO TBEC: DELAYED_RELEASE_TABLET | ORAL | 3 refills | Status: DC

## 2022-09-15 MED ORDER — PANTOPRAZOLE SODIUM 40 MG PO TBEC: DELAYED_RELEASE_TABLET | ORAL | 0 refills | Status: DC

## 2022-09-17 NOTE — Progress Notes (Deleted)
 Complete Physical  Assessment and Plan:  CPE Declined PAP today  Continue annual mammogram   Essential hypertension continue medications, DASH diet, exercise and monitor at home. Call if greater than 130/80. - CBC with Differential/Platelet - CMP/GFR - TSH - Urinalysis, Routine w reflex microscopic (not at [facility]) - Microalbumin / creatinine urine ratio - EKG 12-Lead- defer, had numerous with recent admission   Anemia, unspecified anemia type - macrocytic, recent normal B12  Continue iron supp with Vitamin C and increase green leafy veggies   Asthma, unspecified asthma severity, uncomplicated Continue singulair, albuterol PRN, avoid triggers.   Allergy, subsequent encounter Continue OTC allergy pills  Abnormal Glucose Continue diet and exercise -A1c   Hyperlipidemia LDL goal <70;  Continue low cholesterol diet and exercise.  Check lipid panel.  - Lipid panel  Vitamin D deficiency - Vit D  25 hydroxy (rtn osteoporosis monitoring)  Anxiety Reports mild; limited to driving; declined med; discussed counseling Stress management techniques discussed, increase water, good sleep hygiene discussed, increase exercise, and increase veggies.   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  Screening for hematuria or proteinuria -  Routine UA with reflex microscopic - Microalbumin / creatinine urine ratio  Screening for ischemic heart disease - EKG    No orders of the defined types were placed in this encounter.    Discussed med's effects and SE's. Screening labs and tests as requested with regular follow-up as recommended. Future Appointments  Date Time Provider [place]  09/18/2022  9:00 AM Alycia Rossetti, NP [facility] None  03/24/2023  1:45 PM Frann Rider, NP [facility] None    HPI 62 y.o. female  presents for a complete physical. She has Hypertension; Environmental allergies; Anemia; Anxiety; Asthma; Gout; Hyperlipidemia; Vitamin D deficiency; History of ischemic vertebrobasilar artery thalamic stroke; Overweight (BMI 25.0-29.9); GERD (gastroesophageal reflux disease); Sinobronchitis; Influenza; Nausea; and Cough headache on their problem list.   She is married, no children, has cats, she works as Statistician, enjoys her job.  Jan 2022 she presented with a 2 day hx/o total body right-sided numbness/paresthesias. Head CT scan found only chronic microvascular changes, but Brain MRI showed an acute Left Thalamic CVA and 2 mm right supraclinoid ICA aneurysm. Cardiac echo was negative. Patient was begun on DAPT with bASA /Plavix with recommendations for stopping the Plavix after 3 weeks. She was recommended aggressive lipid control. Continues to follow with Dr. Leonie Man. R ICA aneurysm was stable on 02/2021 CTA.   Asthma/allergies on singulaire and well controlled.   GERD on protonix with fully controlled sx;   BMI is There is no height or weight on file to calculate BMI., she has been working on diet, trying to avoid eating late at night, admits minimal exercise but planning to increase walking now that weather is improved. She has been on Ozempic and was getting it from [place]. Wt Readings from Last 3  Encounters:  04/14/22 157 lb 9.6 oz (71.5 kg)  03/18/22 153 lb 8 oz (69.6 kg)  09/17/21 154 lb 9.6  oz (70.1 kg)   Her blood pressure has been controlled at home, today their BP is    BP Readings from Last 3 Encounters:  04/14/22 122/72  03/18/22 (!) 152/90  09/17/21 108/72    She does not workout, new building at work and has not been working out. She denies chest pain, shortness of breath, dizziness.   She is on cholesterol medication, recently on rosuvastatin 20 mg daily (new). Her cholesterol is not at goal of LDL <70. The cholesterol last visit was:   Lab Results  Component Value Date   CHOL 179 04/14/2022   HDL 73 04/14/2022   LDLCALC 76 04/14/2022   TRIG 200 (H) 04/14/2022   CHOLHDL 2.5 04/14/2022   Last A1C in the office was:  Lab Results  Component Value Date   HGBA1C 5.9 (H) 04/14/2022   Patient is on Vitamin D supplement, taking  Lab Results  Component Value Date   VD25OH 97 09/17/2021   Patient is on allopurinol for gout and does not report a recent flare, she is on 1/2 of the allopurinol.   Lab Results  Component Value Date   LABURIC 4.2 09/17/2020   Recently macrocytic/hyperchromic without anemia on CBC;  is on a B complex, recent check:  Lab Results  Component Value Date   VITAMINB12 1,399 (H) 07/13/2020      Current Medications:  Current Outpatient Medications on File Prior to Visit  Medication Sig   Topiramate ER (TROKENDI XR) 50 MG CP24 Take 1 capsule (50 mg total) by mouth at bedtime.   acyclovir (ZOVIRAX) 400 MG tablet TAKE 1 TABLET (400 MG TOTAL) BY MOUTH 2 (TWO) TIMES DAILY AS NEEDED (FOR FEVER BLISTERS).   allopurinol (ZYLOPRIM) 300 MG tablet TAKE 1/2 TO 1 TAB BY MOUTH EVERY DAY TO PREVENT GOUT   amitriptyline (ELAVIL) 100 MG tablet Take 1 tablet (100 mg total) by mouth at bedtime.   amLODipine (NORVASC) 5 MG tablet TAKE 1 TABLET BY MOUTH  DAILY FOR BLOOD PRESSURE  (GOAL &lt;130/80)   Ascorbic Acid (VITAMIN C PO) Take 1 tablet by mouth daily.   aspirin EC 81 MG EC tablet Take 1 tablet (81 mg total) by mouth daily. Swallow whole.   atenolol  (TENORMIN) 100 MG tablet TAKE 1 TABLET BY MOUTH  DAILY FOR BLOOD PRESSURE   cholecalciferol (VITAMIN D) 1000 units tablet Take 1,000 Units by mouth daily.   furosemide (LASIX) 40 MG tablet TAKE 1 TABLET BY MOUTH DAILY FOR BP AND FLUID RETENTION/ ANKLE  SWELLING   gabapentin (NEURONTIN) 600 MG tablet TAKE 1 AND 1/2 TABLETS BY  MOUTH 3 TIMES DAILY   MAGNESIUM PO Take 2,000 mg by mouth daily.   milk thistle 175 MG tablet Take 175 mg by mouth daily.   montelukast (SINGULAIR) 10 MG tablet TAKE 1 TABLET BY MOUTH  DAILY FOR ALLERGIES   Omega-3 Fatty Acids (FISH OIL) 1000 MG CAPS Take 1,000 mg by mouth daily.   ondansetron (ZOFRAN) 8 MG tablet Take  1/2 to 1 tablet   3 to 4 times /day ( every 6 hours)  if needed for Nausea   pantoprazole (PROTONIX) 40 MG tablet Take  1 tablet  Daily  to Prevent Heartburn & Acid Reflux   rosuvastatin (CRESTOR) 20 MG tablet TAKE 1 TABLET BY MOUTH DAILY FOR CHOLESTEROL   telmisartan (MICARDIS) 80 MG tablet TAKE  1 TABLET BY MOUTH  DAILY FOR BLOOD PRESSURE   No current facility-administered medications on file prior to visit.    Patient Active Problem List   Diagnosis Date Noted   Sinobronchitis 06/18/2021   Influenza 06/18/2021   Nausea 06/18/2021   Cough headache 06/18/2021   History of ischemic vertebrobasilar artery thalamic stroke 09/14/2020   Overweight (BMI 25.0-29.9) 09/14/2020   GERD (gastroesophageal reflux disease) 09/14/2020   Gout 12/12/2015   Hyperlipidemia 12/12/2015   Vitamin D deficiency 12/12/2015   Hypertension    Environmental allergies    Anemia    Anxiety    Asthma     Health Maintenance:   Immunization History  Administered Date(s) Administered   Influenza-Unspecified 04/09/2016, 04/18/2017   Janssen (J&J) SARS-COV-2 Vaccination 11/21/2019   Pneumococcal Polysaccharide-23 06/30/1996   Tdap 11/16/2012   Tetanus: 2014 Pneumovax: 1998 Prevnar 13: declines Flu vaccine: 2020 at office, declines  Shingrix: check with insurance   Covid 19: J&J  Pap: 03/2021 Negative does not another MGM: 03/2021 DEXA: get age 56  Colonoscopy: 2011 due 2021 ? Dr. Shary Key, 5 year recall due to poly abstracted - rwill call to schedule EGD: N/A  Last eye: remote, encouraged  Last dental: last 2021, goes q69m, Dr. Gae Bon Last derm: Robyne Askew, [place], 2021, will follow up PRN  Allergies Allergies  Allergen Reactions   Ciprofloxacin Nausea Only    Headache   Penicillins     Allergy has been more than 48yrs but unsure of what reaction was    SURGICAL HISTORY She  has a past surgical history that includes No past surgeries. FAMILY HISTORY Her family history includes Cancer in her paternal grandmother; Cataracts in her mother; Cirrhosis in her mother; Dementia in her father and maternal grandmother; Diabetes in her father and mother; Heart disease in her father and maternal grandmother; Hypertension in her father and mother; Thyroid disease in her mother. SOCIAL HISTORY She  reports that she has never smoked. She has never used smokeless tobacco. She reports current alcohol use of about 14.0 standard drinks of alcohol per week. She reports that she does not currently use drugs after having used the following drugs: Marijuana.   Review of Systems  Constitutional:  Negative for malaise/fatigue and weight loss.  HENT:  Negative for hearing loss and tinnitus.   Eyes:  Negative for blurred vision and double vision.  Respiratory:  Negative for cough, shortness of breath and wheezing.   Cardiovascular:  Negative for chest pain, palpitations, orthopnea, claudication and leg swelling.  Gastrointestinal:  Negative for abdominal pain, blood in stool, constipation, diarrhea, heartburn, melena, nausea and vomiting.  Genitourinary: Negative.   Musculoskeletal:  Negative for joint pain and myalgias.  Skin:  Negative for rash.  Neurological:  Positive for tingling (R upper and lower extremities) and sensory change. Negative for dizziness,  tremors, seizures, weakness and headaches.  Endo/Heme/Allergies:  Negative for polydipsia.  Psychiatric/Behavioral:  Negative for depression, substance abuse and suicidal ideas. The patient is not nervous/anxious and does not have insomnia.   All other systems reviewed and are negative.   Physical Exam: Estimated body mass index is 28.83 kg/m as calculated from the following:   Height as of 04/14/22: 5\' 2"  (1.575 m).   Weight as of 04/14/22: 157 lb 9.6 oz (71.5 kg). LMP 08/14/2018  General Appearance: Well nourished, in no apparent distress. Eyes: PERRLA, EOMs, conjunctiva no swelling or erythema Sinuses: No Frontal/maxillary tenderness ENT/Mouth: Ext aud canals clear, normal light reflex with TMs without erythema,  bulging.  Good dentition. No erythema, swelling, or exudate on post pharynx. Tonsils not swollen or erythematous. Hearing normal.  Neck: Supple, thyroid normal. No bruits Respiratory: Respiratory effort normal, BS equal bilaterally without rales, rhonchi, wheezing or stridor. Cardio: RRR without murmurs, rubs or gallops. Brisk peripheral pulses without edema.  Chest: symmetric, with normal excursions and percussion. Breasts: getting mammograms, no concerns, defer Abdomen: Soft, +BS. Non tender, no guarding, rebound, hernias, masses, or organomegaly.  Lymphatics: Non tender without lymphadenopathy.  Genitourinary: DECLINES Musculoskeletal: Full ROM all peripheral extremities, and normal gait. Strength in right arm is diminished 4/5. All other extremities 5/5  Skin: Warm, dry without rashes, lesions, ecchymosis.  Neuro: Cranial nerves intact, reflexes equal bilaterally. Normal muscle tone, no cerebellar symptoms. Sensation diminished RUE to monofilament, RLE absent to monofilament; intact to pressure.  Psych: Awake and oriented X 3, normal affect, Insight and Judgment appropriate.   EKG: NSR, no ST changes   Valerie Hobbs E  9:30 AM

## 2022-09-18 ENCOUNTER — Encounter: Payer: 59 | Admitting: Nurse Practitioner

## 2022-09-18 DIAGNOSIS — Z1389 Encounter for screening for other disorder: Secondary | ICD-10-CM

## 2022-09-18 DIAGNOSIS — R7309 Other abnormal glucose: Secondary | ICD-10-CM

## 2022-09-18 DIAGNOSIS — F419 Anxiety disorder, unspecified: Secondary | ICD-10-CM

## 2022-09-18 DIAGNOSIS — Z136 Encounter for screening for cardiovascular disorders: Secondary | ICD-10-CM

## 2022-09-18 DIAGNOSIS — E782 Mixed hyperlipidemia: Secondary | ICD-10-CM

## 2022-09-18 DIAGNOSIS — Z79899 Other long term (current) drug therapy: Secondary | ICD-10-CM

## 2022-09-18 DIAGNOSIS — Z1329 Encounter for screening for other suspected endocrine disorder: Secondary | ICD-10-CM

## 2022-09-18 DIAGNOSIS — E559 Vitamin D deficiency, unspecified: Secondary | ICD-10-CM

## 2022-09-18 DIAGNOSIS — Z8673 Personal history of transient ischemic attack (TIA), and cerebral infarction without residual deficits: Secondary | ICD-10-CM

## 2022-09-18 DIAGNOSIS — E663 Overweight: Secondary | ICD-10-CM

## 2022-09-18 DIAGNOSIS — M1A9XX0 Chronic gout, unspecified, without tophus (tophi): Secondary | ICD-10-CM

## 2022-09-18 DIAGNOSIS — I10 Essential (primary) hypertension: Secondary | ICD-10-CM

## 2022-09-18 DIAGNOSIS — Z9109 Other allergy status, other than to drugs and biological substances: Secondary | ICD-10-CM

## 2022-09-18 DIAGNOSIS — Z0001 Encounter for general adult medical examination with abnormal findings: Secondary | ICD-10-CM

## 2022-09-18 DIAGNOSIS — K219 Gastro-esophageal reflux disease without esophagitis: Secondary | ICD-10-CM

## 2022-09-18 DIAGNOSIS — J45909 Unspecified asthma, uncomplicated: Secondary | ICD-10-CM

## 2022-09-22 ENCOUNTER — Other Ambulatory Visit: Payer: Self-pay | Admitting: Nurse Practitioner

## 2022-09-22 DIAGNOSIS — M1A9XX0 Chronic gout, unspecified, without tophus (tophi): Secondary | ICD-10-CM

## 2022-09-29 NOTE — Progress Notes (Deleted)
 Complete Physical  Assessment and Plan:  CPE Declined PAP today  Continue annual mammogram   Essential hypertension continue medications, DASH diet, exercise and monitor at home. Call if greater than 130/80. - CBC with Differential/Platelet - CMP/GFR - TSH - Urinalysis, Routine w reflex microscopic (not at [facility]) - Microalbumin / creatinine urine ratio - EKG 12-Lead- defer, had numerous with recent admission   Anemia, unspecified anemia type - macrocytic, recent normal B12  Continue iron supp with Vitamin C and increase green leafy veggies   Asthma, unspecified asthma severity, uncomplicated Continue singulair, albuterol PRN, avoid triggers.   Allergy, subsequent encounter Continue OTC allergy pills  Abnormal Glucose Continue diet and exercise -A1c   Hyperlipidemia LDL goal <70;  Continue low cholesterol diet and exercise.  Check lipid panel.  - Lipid panel  Vitamin D deficiency - Vit D  25 hydroxy (rtn osteoporosis monitoring)  Anxiety Reports mild; limited to driving; declined med; discussed counseling Stress management techniques discussed, increase water, good sleep hygiene discussed, increase exercise, and increase veggies.   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  Screening for hematuria or proteinuria -  Routine UA with reflex microscopic - Microalbumin / creatinine urine ratio  Screening for ischemic heart disease - EKG    No orders of the defined types were placed in this encounter.    Discussed med's effects and SE's. Screening labs and tests as requested with regular follow-up as recommended. Future Appointments  Date Time Provider [place]  09/30/2022  9:00 AM Alycia Rossetti, NP [facility] None  03/24/2023  1:45 PM Frann Rider, NP [facility] None    HPI 62 y.o. female  presents for a complete physical. She has Hypertension; Environmental allergies; Anemia; Anxiety; Asthma; Gout; Hyperlipidemia; Vitamin D deficiency; History of ischemic vertebrobasilar artery thalamic stroke; Overweight (BMI 25.0-29.9); GERD (gastroesophageal reflux disease); Sinobronchitis; Influenza; Nausea; and Cough headache on their problem list.   She is married, no children, has cats, she works as Statistician, enjoys her job.  Jan 2022 she presented with a 2 day hx/o total body right-sided numbness/paresthesias. Head CT scan found only chronic microvascular changes, but Brain MRI showed an acute Left Thalamic CVA and 2 mm right supraclinoid ICA aneurysm. Cardiac echo was negative. Patient was begun on DAPT with bASA /Plavix with recommendations for stopping the Plavix after 3 weeks. She was recommended aggressive lipid control. Continues to follow with Dr. Leonie Man. R ICA aneurysm was stable on 02/2021 CTA.   Asthma/allergies on singulaire and well controlled.   GERD on protonix with fully controlled sx;   BMI is There is no height or weight on file to calculate BMI., she has been working on diet, trying to avoid eating late at night, admits minimal exercise but planning to increase walking now that weather is improved. She has been on Ozempic and was getting it from [place]. Wt Readings from Last 3  Encounters:  04/14/22 157 lb 9.6 oz (71.5 kg)  03/18/22 153 lb 8 oz (69.6 kg)  09/17/21 154 lb 9.6  oz (70.1 kg)   Her blood pressure has been controlled at home, today their BP is    BP Readings from Last 3 Encounters:  04/14/22 122/72  03/18/22 (!) 152/90  09/17/21 108/72    She does not workout, new building at work and has not been working out. She denies chest pain, shortness of breath, dizziness.   She is on cholesterol medication, recently on rosuvastatin 20 mg daily (new). Her cholesterol is not at goal of LDL <70. The cholesterol last visit was:   Lab Results  Component Value Date   CHOL 179 04/14/2022   HDL 73 04/14/2022   LDLCALC 76 04/14/2022   TRIG 200 (H) 04/14/2022   CHOLHDL 2.5 04/14/2022   Last A1C in the office was:  Lab Results  Component Value Date   HGBA1C 5.9 (H) 04/14/2022   Patient is on Vitamin D supplement, taking  Lab Results  Component Value Date   VD25OH 97 09/17/2021   Patient is on allopurinol for gout and does not report a recent flare, she is on 1/2 of the allopurinol.   Lab Results  Component Value Date   LABURIC 4.2 09/17/2020   Recently macrocytic/hyperchromic without anemia on CBC;  is on a B complex, recent check:  Lab Results  Component Value Date   VITAMINB12 1,399 (H) 07/13/2020      Current Medications:  Current Outpatient Medications on File Prior to Visit  Medication Sig   Topiramate ER (TROKENDI XR) 50 MG CP24 Take 1 capsule (50 mg total) by mouth at bedtime.   acyclovir (ZOVIRAX) 400 MG tablet TAKE 1 TABLET (400 MG TOTAL) BY MOUTH 2 (TWO) TIMES DAILY AS NEEDED (FOR FEVER BLISTERS).   allopurinol (ZYLOPRIM) 300 MG tablet TAKE 1/2 TO 1 TABLET BY  MOUTH DAILY TO PREVENT GOUT   amitriptyline (ELAVIL) 100 MG tablet Take 1 tablet (100 mg total) by mouth at bedtime.   amLODipine (NORVASC) 5 MG tablet TAKE 1 TABLET BY MOUTH  DAILY FOR BLOOD PRESSURE  (GOAL &lt;130/80)   Ascorbic Acid (VITAMIN C PO) Take 1 tablet by mouth daily.   aspirin EC 81 MG EC tablet Take 1 tablet (81 mg total) by mouth daily. Swallow whole.   atenolol  (TENORMIN) 100 MG tablet TAKE 1 TABLET BY MOUTH  DAILY FOR BLOOD PRESSURE   cholecalciferol (VITAMIN D) 1000 units tablet Take 1,000 Units by mouth daily.   furosemide (LASIX) 40 MG tablet TAKE 1 TABLET BY MOUTH DAILY FOR BP AND FLUID RETENTION/ ANKLE  SWELLING   gabapentin (NEURONTIN) 600 MG tablet TAKE 1 AND 1/2 TABLETS BY  MOUTH 3 TIMES DAILY   MAGNESIUM PO Take 2,000 mg by mouth daily.   milk thistle 175 MG tablet Take 175 mg by mouth daily.   montelukast (SINGULAIR) 10 MG tablet TAKE 1 TABLET BY MOUTH  DAILY FOR ALLERGIES   Omega-3 Fatty Acids (FISH OIL) 1000 MG CAPS Take 1,000 mg by mouth daily.   ondansetron (ZOFRAN) 8 MG tablet Take  1/2 to 1 tablet   3 to 4 times /day ( every 6 hours)  if needed for Nausea   pantoprazole (PROTONIX) 40 MG tablet Take  1 tablet  Daily  to Prevent Heartburn & Acid Reflux   rosuvastatin (CRESTOR) 20 MG tablet TAKE 1 TABLET BY MOUTH DAILY FOR CHOLESTEROL   telmisartan (MICARDIS) 80 MG tablet TAKE  1 TABLET BY MOUTH  DAILY FOR BLOOD PRESSURE   No current facility-administered medications on file prior to visit.    Patient Active Problem List   Diagnosis Date Noted   Sinobronchitis 06/18/2021   Influenza 06/18/2021   Nausea 06/18/2021   Cough headache 06/18/2021   History of ischemic vertebrobasilar artery thalamic stroke 09/14/2020   Overweight (BMI 25.0-29.9) 09/14/2020   GERD (gastroesophageal reflux disease) 09/14/2020   Gout 12/12/2015   Hyperlipidemia 12/12/2015   Vitamin D deficiency 12/12/2015   Hypertension    Environmental allergies    Anemia    Anxiety    Asthma     Health Maintenance:   Immunization History  Administered Date(s) Administered   Influenza-Unspecified 04/09/2016, 04/18/2017   Janssen (J&J) SARS-COV-2 Vaccination 11/21/2019   Pneumococcal Polysaccharide-23 06/30/1996   Tdap 11/16/2012   Tetanus: 2014 Pneumovax: 1998 Prevnar 13: declines Flu vaccine: 2020 at office, declines  Shingrix: check with insurance   Covid 19: J&J  Pap: 03/2021 Negative does not another MGM: 03/2021 DEXA: get age 56  Colonoscopy: 2011 due 2021 ? Dr. Shary Key, 5 year recall due to poly abstracted - rwill call to schedule EGD: N/A  Last eye: remote, encouraged  Last dental: last 2021, goes q69m, Dr. Gae Bon Last derm: Robyne Askew, [place], 2021, will follow up PRN  Allergies Allergies  Allergen Reactions   Ciprofloxacin Nausea Only    Headache   Penicillins     Allergy has been more than 48yrs but unsure of what reaction was    SURGICAL HISTORY She  has a past surgical history that includes No past surgeries. FAMILY HISTORY Her family history includes Cancer in her paternal grandmother; Cataracts in her mother; Cirrhosis in her mother; Dementia in her father and maternal grandmother; Diabetes in her father and mother; Heart disease in her father and maternal grandmother; Hypertension in her father and mother; Thyroid disease in her mother. SOCIAL HISTORY She  reports that she has never smoked. She has never used smokeless tobacco. She reports current alcohol use of about 14.0 standard drinks of alcohol per week. She reports that she does not currently use drugs after having used the following drugs: Marijuana.   Review of Systems  Constitutional:  Negative for malaise/fatigue and weight loss.  HENT:  Negative for hearing loss and tinnitus.   Eyes:  Negative for blurred vision and double vision.  Respiratory:  Negative for cough, shortness of breath and wheezing.   Cardiovascular:  Negative for chest pain, palpitations, orthopnea, claudication and leg swelling.  Gastrointestinal:  Negative for abdominal pain, blood in stool, constipation, diarrhea, heartburn, melena, nausea and vomiting.  Genitourinary: Negative.   Musculoskeletal:  Negative for joint pain and myalgias.  Skin:  Negative for rash.  Neurological:  Positive for tingling (R upper and lower extremities) and sensory change. Negative for dizziness,  tremors, seizures, weakness and headaches.  Endo/Heme/Allergies:  Negative for polydipsia.  Psychiatric/Behavioral:  Negative for depression, substance abuse and suicidal ideas. The patient is not nervous/anxious and does not have insomnia.   All other systems reviewed and are negative.   Physical Exam: Estimated body mass index is 28.83 kg/m as calculated from the following:   Height as of 04/14/22: 5\' 2"  (1.575 m).   Weight as of 04/14/22: 157 lb 9.6 oz (71.5 kg). LMP 08/14/2018  General Appearance: Well nourished, in no apparent distress. Eyes: PERRLA, EOMs, conjunctiva no swelling or erythema Sinuses: No Frontal/maxillary tenderness ENT/Mouth: Ext aud canals clear, normal light reflex with TMs without erythema,  bulging.  Good dentition. No erythema, swelling, or exudate on post pharynx. Tonsils not swollen or erythematous. Hearing normal.  Neck: Supple, thyroid normal. No bruits Respiratory: Respiratory effort normal, BS equal bilaterally without rales, rhonchi, wheezing or stridor. Cardio: RRR without murmurs, rubs or gallops. Brisk peripheral pulses without edema.  Chest: symmetric, with normal excursions and percussion. Breasts: getting mammograms, no concerns, defer Abdomen: Soft, +BS. Non tender, no guarding, rebound, hernias, masses, or organomegaly.  Lymphatics: Non tender without lymphadenopathy.  Genitourinary: DECLINES Musculoskeletal: Full ROM all peripheral extremities, and normal gait. Strength in right arm is diminished 4/5. All other extremities 5/5  Skin: Warm, dry without rashes, lesions, ecchymosis.  Neuro: Cranial nerves intact, reflexes equal bilaterally. Normal muscle tone, no cerebellar symptoms. Sensation diminished RUE to monofilament, RLE absent to monofilament; intact to pressure.  Psych: Awake and oriented X 3, normal affect, Insight and Judgment appropriate.   EKG: NSR, no ST changes   Kaulana Brindle E  12:34 PM

## 2022-09-30 ENCOUNTER — Encounter: Payer: 59 | Admitting: Nurse Practitioner

## 2022-10-08 ENCOUNTER — Encounter: Payer: Self-pay | Admitting: Adult Health

## 2022-10-26 ENCOUNTER — Other Ambulatory Visit: Payer: Self-pay | Admitting: Nurse Practitioner

## 2022-10-26 DIAGNOSIS — I10 Essential (primary) hypertension: Secondary | ICD-10-CM

## 2022-10-28 ENCOUNTER — Encounter: Payer: 59 | Admitting: Nurse Practitioner

## 2022-11-02 ENCOUNTER — Other Ambulatory Visit: Payer: Self-pay | Admitting: Adult Health

## 2022-11-03 NOTE — Progress Notes (Deleted)
 Complete Physical  Assessment and Plan:  CPE Declined PAP today  Continue annual mammogram   Essential hypertension continue medications, DASH diet, exercise and monitor at home. Call if greater than 130/80. - CBC with Differential/Platelet - CMP/GFR - TSH - Urinalysis, Routine w reflex microscopic (not at [facility]) - Microalbumin / creatinine urine ratio - EKG 12-Lead- defer, had numerous with recent admission   Anemia, unspecified anemia type - macrocytic, recent normal B12  Continue iron supp with Vitamin C and increase green leafy veggies   Asthma, unspecified asthma severity, uncomplicated Continue singulair, albuterol PRN, avoid triggers.   Allergy, subsequent encounter Continue OTC allergy pills  Abnormal Glucose Continue diet and exercise -A1c   Hyperlipidemia LDL goal <70;  Continue low cholesterol diet and exercise.  Check lipid panel.  - Lipid panel  Vitamin D deficiency - Vit D  25 hydroxy (rtn osteoporosis monitoring)  Anxiety Reports mild; limited to driving; declined med; discussed counseling Stress management techniques discussed, increase water, good sleep hygiene discussed, increase exercise, and increase veggies.   GERD Well managed on current medications Discussed diet, avoiding triggers and other lifestyle changes  Chronic gout without tophus, unspecified cause, unspecified site Continue allopurinol Diet discussed -     Uric acid  History of thalamic CVA Completed 3 weeks of DAPT; now on aggressive lipid therapy and bASA Continue to follow with Neurology Control BP;   Overweight - BMI 25 Long discussion about weight loss, diet, and exercise Recommended diet heavy in fruits and veggies and low in animal meats, cheeses, and dairy products, appropriate calorie intake Discussed appropriate weight for height Continue Ozempic, will increase to 1 mg SQ QW- she gets prescription filled in [place].  Screening for hematuria or proteinuria -  Routine UA with reflex microscopic - Microalbumin / creatinine urine ratio  Screening for ischemic heart disease - EKG    No orders of the defined types were placed in this encounter.    Discussed med's effects and SE's. Screening labs and tests as requested with regular follow-up as recommended. Future Appointments  Date Time Provider Department Center  11/04/2022 10:00 AM Raynelle Dick, NP [facility] None  03/24/2023  1:45 PM Ihor Austin, NP [facility] None  11/04/2023 10:00 AM Raynelle Dick, NP [facility] None    HPI 62 y.o. female  presents for a complete physical. She has Hypertension; Environmental allergies; Anemia; Anxiety; Asthma; Gout; Hyperlipidemia; Vitamin D deficiency; History of ischemic vertebrobasilar artery thalamic stroke; Overweight (BMI 25.0-29.9); GERD (gastroesophageal reflux disease); Sinobronchitis; Influenza; Nausea; and Cough headache on their problem list.   She is married, no children, has cats, she works as Psychologist, forensic, enjoys her job.  Jan 2022 she presented with a 2 day hx/o total body right-sided numbness/paresthesias. Head CT scan found only chronic microvascular changes, but Brain MRI showed an acute Left Thalamic CVA and 2 mm right supraclinoid ICA aneurysm. Cardiac echo was negative. Patient was begun on DAPT with bASA /Plavix with recommendations for stopping the Plavix after 3 weeks. She was recommended aggressive lipid control. Continues to follow with Dr. Pearlean Brownie. R ICA aneurysm was stable on 02/2021 CTA.   Asthma/allergies on singulaire and well controlled.   GERD on protonix with fully controlled sx;   BMI is There is no height or weight on file to calculate BMI., she has been working on diet, trying to avoid eating late at night, admits minimal exercise but planning to increase walking now that weather is improved. She has been on Ozempic and was  getting it from [place]. Wt Readings from Last 3 Encounters:  04/14/22 157 lb 9.6 oz (71.5  kg)  03/18/22 153 lb 8 oz (69.6 kg)  09/17/21 154 lb 9.6 oz (70.1 kg)   Her blood pressure has been controlled at home, today their BP is    BP Readings from Last 3 Encounters:  04/14/22 122/72  03/18/22 (!) 152/90  09/17/21 108/72    She does not workout, new building at work and has not been working out. She denies chest pain, shortness of breath, dizziness.   She is on cholesterol medication, recently on rosuvastatin 20 mg daily (new). Her cholesterol is not at goal of LDL <70. The cholesterol last visit was:   Lab Results  Component Value Date   CHOL 179 04/14/2022   HDL 73 04/14/2022   LDLCALC 76 04/14/2022   TRIG 200 (H) 04/14/2022   CHOLHDL 2.5 04/14/2022   Last W0J in the office was:  Lab Results  Component Value Date   HGBA1C 5.9 (H) 04/14/2022   Patient is on Vitamin D supplement, taking  Lab Results  Component Value Date   VD25OH 97 09/17/2021   Patient is on allopurinol for gout and does not report a recent flare, she is on 1/2 of the allopurinol.   Lab Results  Component Value Date   LABURIC 4.2 09/17/2020   Recently macrocytic/hyperchromic without anemia on CBC;  is on a B complex, recent check:  Lab Results  Component Value Date   VITAMINB12 1,399 (H) 07/13/2020      Current Medications:  Current Outpatient Medications on File Prior to Visit  Medication Sig   Topiramate ER (TROKENDI XR) 50 MG CP24 Take 1 capsule (50 mg total) by mouth at bedtime.   acyclovir (ZOVIRAX) 400 MG tablet TAKE 1 TABLET (400 MG TOTAL) BY MOUTH 2 (TWO) TIMES DAILY AS NEEDED (FOR FEVER BLISTERS).   allopurinol (ZYLOPRIM) 300 MG tablet TAKE 1/2 TO 1 TABLET BY  MOUTH DAILY TO PREVENT GOUT   amitriptyline (ELAVIL) 100 MG tablet Take 1 tablet (100 mg total) by mouth at bedtime.   amLODipine (NORVASC) 5 MG tablet TAKE 1 TABLET BY MOUTH DAILY FOR BLOOD PRESSURE (GOAL LESS THAN  130/80)   Ascorbic Acid (VITAMIN C PO) Take 1 tablet by mouth daily.   aspirin EC 81 MG EC tablet Take 1  tablet (81 mg total) by mouth daily. Swallow whole.   atenolol (TENORMIN) 100 MG tablet TAKE 1 TABLET BY MOUTH  DAILY FOR BLOOD PRESSURE   cholecalciferol (VITAMIN D) 1000 units tablet Take 1,000 Units by mouth daily.   furosemide (LASIX) 40 MG tablet TAKE 1 TABLET BY MOUTH DAILY FOR BP AND FLUID RETENTION/ ANKLE  SWELLING   gabapentin (NEURONTIN) 600 MG tablet TAKE 1 AND 1/2 TABLETS BY  MOUTH 3 TIMES DAILY   MAGNESIUM PO Take 2,000 mg by mouth daily.   milk thistle 175 MG tablet Take 175 mg by mouth daily.   montelukast (SINGULAIR) 10 MG tablet TAKE 1 TABLET BY MOUTH  DAILY FOR ALLERGIES   Omega-3 Fatty Acids (FISH OIL) 1000 MG CAPS Take 1,000 mg by mouth daily.   ondansetron (ZOFRAN) 8 MG tablet Take  1/2 to 1 tablet   3 to 4 times /day ( every 6 hours)  if needed for Nausea   pantoprazole (PROTONIX) 40 MG tablet Take  1 tablet  Daily  to Prevent Heartburn & Acid Reflux   rosuvastatin (CRESTOR) 20 MG tablet TAKE 1 TABLET BY MOUTH DAILY  FOR CHOLESTEROL   telmisartan (MICARDIS) 80 MG tablet TAKE 1 TABLET BY MOUTH DAILY FOR BLOOD PRESSURE   No current facility-administered medications on file prior to visit.    Patient Active Problem List   Diagnosis Date Noted   Sinobronchitis 06/18/2021   Influenza 06/18/2021   Nausea 06/18/2021   Cough headache 06/18/2021   History of ischemic vertebrobasilar artery thalamic stroke 09/14/2020   Overweight (BMI 25.0-29.9) 09/14/2020   GERD (gastroesophageal reflux disease) 09/14/2020   Gout 12/12/2015   Hyperlipidemia 12/12/2015   Vitamin D deficiency 12/12/2015   Hypertension    Environmental allergies    Anemia    Anxiety    Asthma     Health Maintenance:   Immunization History  Administered Date(s) Administered   Influenza-Unspecified 04/09/2016, 04/18/2017   Janssen (J&J) SARS-COV-2 Vaccination 11/21/2019   Pneumococcal Polysaccharide-23 06/30/1996   Tdap 11/16/2012   Tetanus: 2014 Pneumovax: 1998 Prevnar 13: declines Flu vaccine:  2020 at office, declines  Shingrix: check with insurance  Covid 19: J&J  Pap: 03/2021 Negative does not another MGM: 03/2021 DEXA: get age 73  Colonoscopy: 2011 due 2021 ? Dr. Yevonne Pax, 5 year recall due to poly abstracted - rwill call to schedule EGD: N/A  Last eye: remote, encouraged  Last dental: last 2021, goes q21m, Dr. Gwendolyn Grant Last derm: Mackey [place], 2021, will follow up PRN  Allergies Allergies  Allergen Reactions   Ciprofloxacin Nausea Only    Headache   Penicillins     Allergy has been more than 44yrs but unsure of what reaction was    SURGICAL HISTORY She  has a past surgical history that includes No past surgeries. FAMILY HISTORY Her family history includes Cancer in her paternal grandmother; Cataracts in her mother; Cirrhosis in her mother; Dementia in her father and maternal grandmother; Diabetes in her father and mother; Heart disease in her father and maternal grandmother; Hypertension in her father and mother; Thyroid disease in her mother. SOCIAL HISTORY She  reports that she has never smoked. She has never used smokeless tobacco. She reports current alcohol use of about 14.0 standard drinks of alcohol per week. She reports that she does not currently use drugs after having used the following drugs: Marijuana.   Review of Systems  Constitutional:  Negative for malaise/fatigue and weight loss.  HENT:  Negative for hearing loss and tinnitus.   Eyes:  Negative for blurred vision and double vision.  Respiratory:  Negative for cough, shortness of breath and wheezing.   Cardiovascular:  Negative for chest pain, palpitations, orthopnea, claudication and leg swelling.  Gastrointestinal:  Negative for abdominal pain, blood in stool, constipation, diarrhea, heartburn, melena, nausea and vomiting.  Genitourinary: Negative.   Musculoskeletal:  Negative for joint pain and myalgias.  Skin:  Negative for rash.  Neurological:  Positive for tingling (R upper and lower  extremities) and sensory change. Negative for dizziness, tremors, seizures, weakness and headaches.  Endo/Heme/Allergies:  Negative for polydipsia.  Psychiatric/Behavioral:  Negative for depression, substance abuse and suicidal ideas. The patient is not nervous/anxious and does not have insomnia.   All other systems reviewed and are negative.   Physical Exam: Estimated body mass index is 28.83 kg/m as calculated from the following:   Height as of 04/14/22: 5\' 2"  (1.575 m).   Weight as of 04/14/22: 157 lb 9.6 oz (71.5 kg). LMP 08/14/2018  General Appearance: Well nourished, in no apparent distress. Eyes: PERRLA, EOMs, conjunctiva no swelling or erythema Sinuses: No Frontal/maxillary tenderness ENT/Mouth: Ext aud  canals clear, normal light reflex with TMs without erythema, bulging.  Good dentition. No erythema, swelling, or exudate on post pharynx. Tonsils not swollen or erythematous. Hearing normal.  Neck: Supple, thyroid normal. No bruits Respiratory: Respiratory effort normal, BS equal bilaterally without rales, rhonchi, wheezing or stridor. Cardio: RRR without murmurs, rubs or gallops. Brisk peripheral pulses without edema.  Chest: symmetric, with normal excursions and percussion. Breasts: getting mammograms, no concerns, defer Abdomen: Soft, +BS. Non tender, no guarding, rebound, hernias, masses, or organomegaly.  Lymphatics: Non tender without lymphadenopathy.  Genitourinary: DECLINES Musculoskeletal: Full ROM all peripheral extremities, and normal gait. Strength in right arm is diminished 4/5. All other extremities 5/5  Skin: Warm, dry without rashes, lesions, ecchymosis.  Neuro: Cranial nerves intact, reflexes equal bilaterally. Normal muscle tone, no cerebellar symptoms. Sensation diminished RUE to monofilament, RLE absent to monofilament; intact to pressure.  Psych: Awake and oriented X 3, normal affect, Insight and Judgment appropriate.   EKG: NSR, no ST changes   Shaheen Mende E   12:15 PM

## 2022-11-04 ENCOUNTER — Encounter: Payer: 59 | Admitting: Nurse Practitioner

## 2022-11-04 DIAGNOSIS — Z8673 Personal history of transient ischemic attack (TIA), and cerebral infarction without residual deficits: Secondary | ICD-10-CM

## 2022-11-04 DIAGNOSIS — D649 Anemia, unspecified: Secondary | ICD-10-CM

## 2022-11-04 DIAGNOSIS — E782 Mixed hyperlipidemia: Secondary | ICD-10-CM

## 2022-11-04 DIAGNOSIS — J45909 Unspecified asthma, uncomplicated: Secondary | ICD-10-CM

## 2022-11-04 DIAGNOSIS — Z79899 Other long term (current) drug therapy: Secondary | ICD-10-CM

## 2022-11-04 DIAGNOSIS — Z9109 Other allergy status, other than to drugs and biological substances: Secondary | ICD-10-CM

## 2022-11-04 DIAGNOSIS — E663 Overweight: Secondary | ICD-10-CM

## 2022-11-04 DIAGNOSIS — Z136 Encounter for screening for cardiovascular disorders: Secondary | ICD-10-CM

## 2022-11-04 DIAGNOSIS — K219 Gastro-esophageal reflux disease without esophagitis: Secondary | ICD-10-CM

## 2022-11-04 DIAGNOSIS — Z0001 Encounter for general adult medical examination with abnormal findings: Secondary | ICD-10-CM

## 2022-11-04 DIAGNOSIS — M1A9XX0 Chronic gout, unspecified, without tophus (tophi): Secondary | ICD-10-CM

## 2022-11-04 DIAGNOSIS — E559 Vitamin D deficiency, unspecified: Secondary | ICD-10-CM

## 2022-11-04 DIAGNOSIS — Z1389 Encounter for screening for other disorder: Secondary | ICD-10-CM

## 2022-11-04 DIAGNOSIS — F419 Anxiety disorder, unspecified: Secondary | ICD-10-CM

## 2022-11-04 DIAGNOSIS — R7309 Other abnormal glucose: Secondary | ICD-10-CM

## 2022-11-04 DIAGNOSIS — Z1329 Encounter for screening for other suspected endocrine disorder: Secondary | ICD-10-CM

## 2022-11-04 DIAGNOSIS — I10 Essential (primary) hypertension: Secondary | ICD-10-CM

## 2022-11-12 NOTE — Progress Notes (Deleted)
 Complete Physical  Assessment and Plan:  Encounter for general adult medical examination with abnormal findings Declined PAP today  Continue annual mammogram   Essential hypertension continue medications, DASH diet, exercise and monitor at home. Call if greater than 130/80. - CBC with Differential/Platelet - CMP/GFR - TSH - Urinalysis, Routine w reflex microscopic (not at [facility]) - Microalbumin / creatinine urine ratio - EKG 12-Lead- defer, had numerous with recent admission   Anemia, unspecified anemia type - macrocytic, recent normal B12  Continue iron supp with Vitamin C and increase green leafy veggies   Asthma, unspecified asthma severity, uncomplicated Continue singulair, albuterol PRN, avoid triggers.   Allergy, subsequent encounter Continue OTC allergy pills  Abnormal Glucose Continue diet and exercise -A1c   Hyperlipidemia LDL goal <70;  Continue low cholesterol diet and exercise.  Check lipid panel.  - Lipid panel  Vitamin D deficiency - Vit D  25 hydroxy (rtn osteoporosis monitoring)  Anxiety Reports mild; limited to driving; declined med; discussed counseling Stress management techniques discussed, increase water, good sleep hygiene discussed, increase exercise, and increase veggies.   GERD Well managed on current medications Discussed diet, avoiding triggers and other lifestyle changes  Chronic gout without tophus, unspecified cause, unspecified site Continue allopurinol Diet discussed -     Uric acid  History of thalamic CVA Completed 3 weeks of DAPT; now on aggressive lipid therapy and bASA Continue to follow with Neurology Control BP;   Overweight - BMI 25 Long discussion about weight loss, diet, and exercise Recommended diet heavy in fruits and veggies and low in animal meats, cheeses, and dairy products, appropriate calorie intake Discussed appropriate weight for height Continue Ozempic, will increase to 1 mg SQ QW- she gets prescription  filled in [place].  Screening for hematuria or proteinuria - Routine UA with reflex microscopic - Microalbumin / creatinine urine ratio  Screening for ischemic heart disease - EKG  Screening for thyroid -TSH  Screening for AAA - U/S Abdominal retroperitoneal LTD   No orders of the defined types were placed in this encounter.    Discussed med's effects and SE's. Screening labs and tests as requested with regular follow-up as recommended. Future Appointments  Date Time Provider Department Center  11/13/2022  3:00 PM Raynelle Dick, NP [facility] None  02/23/2023 12:45 PM Ihor Austin, NP [facility] None  11/16/2023  3:00 PM Raynelle Dick, NP [facility] None    HPI 62 y.o. female  presents for a complete physical. She has Hypertension; Environmental allergies; Anemia; Anxiety; Asthma; Gout; Hyperlipidemia; Vitamin D deficiency; History of ischemic vertebrobasilar artery thalamic stroke; Overweight (BMI 25.0-29.9); GERD (gastroesophageal reflux disease); Sinobronchitis; Influenza; Nausea; and Cough headache on their problem list.   She is married, no children, has cats, she works as Psychologist, forensic, enjoys her job.  Jan 2022 she presented with a 2 day hx/o total body right-sided numbness/paresthesias. Head CT scan found only chronic microvascular changes, but Brain MRI showed an acute Left Thalamic CVA and 2 mm right supraclinoid ICA aneurysm. Cardiac echo was negative. Patient was begun on DAPT with bASA /Plavix with recommendations for stopping the Plavix after 3 weeks. She was recommended aggressive lipid control. Continues to follow with Dr. Pearlean Brownie. R ICA aneurysm was stable on 02/2021 CTA.   Asthma/allergies on singulaire and well controlled.   GERD on protonix with fully controlled sx;   BMI is There is no height or weight on file to calculate BMI., she has been working on diet, trying to avoid eating late  at night, admits minimal exercise but planning to increase walking now  that weather is improved. She has been on Ozempic and was getting it from [place]. Wt Readings from Last 3 Encounters:  04/14/22 157 lb 9.6 oz (71.5 kg)  03/18/22 153 lb 8 oz (69.6 kg)  09/17/21 154 lb 9.6 oz (70.1 kg)   Her blood pressure has been controlled at home, today their BP is    BP Readings from Last 3 Encounters:  04/14/22 122/72  03/18/22 (!) 152/90  09/17/21 108/72    She does not workout, new building at work and has not been working out. She denies chest pain, shortness of breath, dizziness.   She is on cholesterol medication, recently on rosuvastatin 20 mg daily (new). Her cholesterol is not at goal of LDL <70. The cholesterol last visit was:   Lab Results  Component Value Date   CHOL 179 04/14/2022   HDL 73 04/14/2022   LDLCALC 76 04/14/2022   TRIG 200 (H) 04/14/2022   CHOLHDL 2.5 04/14/2022   Last Z6X in the office was:  Lab Results  Component Value Date   HGBA1C 5.9 (H) 04/14/2022   Patient is on Vitamin D supplement, taking  Lab Results  Component Value Date   VD25OH 97 09/17/2021   Patient is on allopurinol for gout and does not report a recent flare, she is on 1/2 of the allopurinol.   Lab Results  Component Value Date   LABURIC 4.2 09/17/2020   Recently macrocytic/hyperchromic without anemia on CBC;  is on a B complex, recent check:  Lab Results  Component Value Date   VITAMINB12 1,399 (H) 07/13/2020      Current Medications:  Current Outpatient Medications on File Prior to Visit  Medication Sig   Topiramate ER (TROKENDI XR) 50 MG CP24 Take 1 capsule (50 mg total) by mouth at bedtime.   acyclovir (ZOVIRAX) 400 MG tablet TAKE 1 TABLET (400 MG TOTAL) BY MOUTH 2 (TWO) TIMES DAILY AS NEEDED (FOR FEVER BLISTERS).   allopurinol (ZYLOPRIM) 300 MG tablet TAKE 1/2 TO 1 TABLET BY  MOUTH DAILY TO PREVENT GOUT   amitriptyline (ELAVIL) 100 MG tablet TAKE 1 TABLET BY MOUTH EVERYDAY AT BEDTIME   amLODipine (NORVASC) 5 MG tablet TAKE 1 TABLET BY MOUTH DAILY  FOR BLOOD PRESSURE (GOAL LESS THAN  130/80)   Ascorbic Acid (VITAMIN C PO) Take 1 tablet by mouth daily.   aspirin EC 81 MG EC tablet Take 1 tablet (81 mg total) by mouth daily. Swallow whole.   atenolol (TENORMIN) 100 MG tablet TAKE 1 TABLET BY MOUTH  DAILY FOR BLOOD PRESSURE   cholecalciferol (VITAMIN D) 1000 units tablet Take 1,000 Units by mouth daily.   furosemide (LASIX) 40 MG tablet TAKE 1 TABLET BY MOUTH DAILY FOR BP AND FLUID RETENTION/ ANKLE  SWELLING   gabapentin (NEURONTIN) 600 MG tablet TAKE 1 AND 1/2 TABLETS BY  MOUTH 3 TIMES DAILY   MAGNESIUM PO Take 2,000 mg by mouth daily.   milk thistle 175 MG tablet Take 175 mg by mouth daily.   montelukast (SINGULAIR) 10 MG tablet TAKE 1 TABLET BY MOUTH  DAILY FOR ALLERGIES   Omega-3 Fatty Acids (FISH OIL) 1000 MG CAPS Take 1,000 mg by mouth daily.   ondansetron (ZOFRAN) 8 MG tablet Take  1/2 to 1 tablet   3 to 4 times /day ( every 6 hours)  if needed for Nausea   pantoprazole (PROTONIX) 40 MG tablet Take  1 tablet  Daily  to Prevent Heartburn & Acid Reflux   rosuvastatin (CRESTOR) 20 MG tablet TAKE 1 TABLET BY MOUTH DAILY FOR CHOLESTEROL   telmisartan (MICARDIS) 80 MG tablet TAKE 1 TABLET BY MOUTH DAILY FOR BLOOD PRESSURE   No current facility-administered medications on file prior to visit.    Patient Active Problem List   Diagnosis Date Noted   Sinobronchitis 06/18/2021   Influenza 06/18/2021   Nausea 06/18/2021   Cough headache 06/18/2021   History of ischemic vertebrobasilar artery thalamic stroke 09/14/2020   Overweight (BMI 25.0-29.9) 09/14/2020   GERD (gastroesophageal reflux disease) 09/14/2020   Gout 12/12/2015   Hyperlipidemia 12/12/2015   Vitamin D deficiency 12/12/2015   Hypertension    Environmental allergies    Anemia    Anxiety    Asthma     Health Maintenance:   Immunization History  Administered Date(s) Administered   Influenza-Unspecified 04/09/2016, 04/18/2017   Janssen (J&J) SARS-COV-2 Vaccination  11/21/2019   Pneumococcal Polysaccharide-23 06/30/1996   Tdap 11/16/2012   Tetanus: 2014 Pneumovax: 1998 Prevnar 13: declines Flu vaccine: 2020 at office, declines  Shingrix: check with insurance  Covid 19: J&J  Pap: 03/2021 Negative does not another MGM: 03/2021 DEXA: get age 18  Colonoscopy: 2011 due 2021 ? Dr. Yevonne Pax, 5 year recall due to poly abstracted - rwill call to schedule EGD: N/A  Last eye: remote, encouraged  Last dental: last 2021, goes q65m, Dr. Gwendolyn Grant Last derm: Mackey [place], 2021, will follow up PRN  Allergies Allergies  Allergen Reactions   Ciprofloxacin Nausea Only    Headache   Penicillins     Allergy has been more than 57yrs but unsure of what reaction was    SURGICAL HISTORY She  has a past surgical history that includes No past surgeries. FAMILY HISTORY Her family history includes Cancer in her paternal grandmother; Cataracts in her mother; Cirrhosis in her mother; Dementia in her father and maternal grandmother; Diabetes in her father and mother; Heart disease in her father and maternal grandmother; Hypertension in her father and mother; Thyroid disease in her mother. SOCIAL HISTORY She  reports that she has never smoked. She has never used smokeless tobacco. She reports current alcohol use of about 14.0 standard drinks of alcohol per week. She reports that she does not currently use drugs after having used the following drugs: Marijuana.   Review of Systems  Constitutional:  Negative for malaise/fatigue and weight loss.  HENT:  Negative for hearing loss and tinnitus.   Eyes:  Negative for blurred vision and double vision.  Respiratory:  Negative for cough, shortness of breath and wheezing.   Cardiovascular:  Negative for chest pain, palpitations, orthopnea, claudication and leg swelling.  Gastrointestinal:  Negative for abdominal pain, blood in stool, constipation, diarrhea, heartburn, melena, nausea and vomiting.  Genitourinary: Negative.    Musculoskeletal:  Negative for joint pain and myalgias.  Skin:  Negative for rash.  Neurological:  Positive for tingling (R upper and lower extremities) and sensory change. Negative for dizziness, tremors, seizures, weakness and headaches.  Endo/Heme/Allergies:  Negative for polydipsia.  Psychiatric/Behavioral:  Negative for depression, substance abuse and suicidal ideas. The patient is not nervous/anxious and does not have insomnia.   All other systems reviewed and are negative.   Physical Exam: Estimated body mass index is 28.83 kg/m as calculated from the following:   Height as of 04/14/22: 5\' 2"  (1.575 m).   Weight as of 04/14/22: 157 lb 9.6 oz (71.5 kg). LMP 08/14/2018  General Appearance: Well nourished,  in no apparent distress. Eyes: PERRLA, EOMs, conjunctiva no swelling or erythema Sinuses: No Frontal/maxillary tenderness ENT/Mouth: Ext aud canals clear, normal light reflex with TMs without erythema, bulging.  Good dentition. No erythema, swelling, or exudate on post pharynx. Tonsils not swollen or erythematous. Hearing normal.  Neck: Supple, thyroid normal. No bruits Respiratory: Respiratory effort normal, BS equal bilaterally without rales, rhonchi, wheezing or stridor. Cardio: RRR without murmurs, rubs or gallops. Brisk peripheral pulses without edema.  Chest: symmetric, with normal excursions and percussion. Breasts: getting mammograms, no concerns, defer Abdomen: Soft, +BS. Non tender, no guarding, rebound, hernias, masses, or organomegaly.  Lymphatics: Non tender without lymphadenopathy.  Genitourinary: DECLINES Musculoskeletal: Full ROM all peripheral extremities, and normal gait. Strength in right arm is diminished 4/5. All other extremities 5/5  Skin: Warm, dry without rashes, lesions, ecchymosis.  Neuro: Cranial nerves intact, reflexes equal bilaterally. Normal muscle tone, no cerebellar symptoms. Sensation diminished RUE to monofilament, RLE absent to monofilament;  intact to pressure.  Psych: Awake and oriented X 3, normal affect, Insight and Judgment appropriate.   EKG: NSR, no ST changes   Jurnee Nakayama E  12:37 PM

## 2022-11-13 ENCOUNTER — Encounter: Payer: 59 | Admitting: Nurse Practitioner

## 2022-11-13 DIAGNOSIS — E663 Overweight: Secondary | ICD-10-CM

## 2022-11-13 DIAGNOSIS — Z79899 Other long term (current) drug therapy: Secondary | ICD-10-CM

## 2022-11-13 DIAGNOSIS — J45909 Unspecified asthma, uncomplicated: Secondary | ICD-10-CM

## 2022-11-13 DIAGNOSIS — D649 Anemia, unspecified: Secondary | ICD-10-CM

## 2022-11-13 DIAGNOSIS — E782 Mixed hyperlipidemia: Secondary | ICD-10-CM

## 2022-11-13 DIAGNOSIS — I10 Essential (primary) hypertension: Secondary | ICD-10-CM

## 2022-11-13 DIAGNOSIS — Z8673 Personal history of transient ischemic attack (TIA), and cerebral infarction without residual deficits: Secondary | ICD-10-CM

## 2022-11-13 DIAGNOSIS — Z0001 Encounter for general adult medical examination with abnormal findings: Secondary | ICD-10-CM

## 2022-11-13 DIAGNOSIS — M1A9XX0 Chronic gout, unspecified, without tophus (tophi): Secondary | ICD-10-CM

## 2022-11-13 DIAGNOSIS — Z9109 Other allergy status, other than to drugs and biological substances: Secondary | ICD-10-CM

## 2022-11-13 DIAGNOSIS — Z136 Encounter for screening for cardiovascular disorders: Secondary | ICD-10-CM

## 2022-11-13 DIAGNOSIS — Z1389 Encounter for screening for other disorder: Secondary | ICD-10-CM

## 2022-11-13 DIAGNOSIS — E559 Vitamin D deficiency, unspecified: Secondary | ICD-10-CM

## 2022-11-13 DIAGNOSIS — F419 Anxiety disorder, unspecified: Secondary | ICD-10-CM

## 2022-11-13 DIAGNOSIS — Z1329 Encounter for screening for other suspected endocrine disorder: Secondary | ICD-10-CM

## 2022-11-13 DIAGNOSIS — R7309 Other abnormal glucose: Secondary | ICD-10-CM

## 2022-11-13 DIAGNOSIS — K219 Gastro-esophageal reflux disease without esophagitis: Secondary | ICD-10-CM

## 2022-11-28 ENCOUNTER — Other Ambulatory Visit: Payer: Self-pay | Admitting: Adult Health

## 2023-01-21 DIAGNOSIS — Z0271 Encounter for disability determination: Secondary | ICD-10-CM

## 2023-02-23 ENCOUNTER — Ambulatory Visit (INDEPENDENT_AMBULATORY_CARE_PROVIDER_SITE_OTHER): Payer: 59 | Admitting: Adult Health

## 2023-02-23 ENCOUNTER — Encounter: Payer: Self-pay | Admitting: Adult Health

## 2023-02-23 VITALS — BP 116/80 | HR 77 | Ht 62.0 in | Wt 160.0 lb

## 2023-02-23 DIAGNOSIS — I671 Cerebral aneurysm, nonruptured: Secondary | ICD-10-CM

## 2023-02-23 DIAGNOSIS — R413 Other amnesia: Secondary | ICD-10-CM | POA: Diagnosis not present

## 2023-02-23 DIAGNOSIS — G890 Central pain syndrome: Secondary | ICD-10-CM | POA: Diagnosis not present

## 2023-02-23 DIAGNOSIS — E539 Vitamin B deficiency, unspecified: Secondary | ICD-10-CM

## 2023-02-23 DIAGNOSIS — R4189 Other symptoms and signs involving cognitive functions and awareness: Secondary | ICD-10-CM

## 2023-02-23 DIAGNOSIS — I6381 Other cerebral infarction due to occlusion or stenosis of small artery: Secondary | ICD-10-CM
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
## 2023-02-23 NOTE — Patient Instructions (Addendum)
You will be called to complete MRI brain to evaluate for other causes on memory loss  Return to office this week to complete lab work to look for reversible causes  Continue on current medications for pain for now   Follow up in 6 months or call earlier if needed

## 2023-02-23 NOTE — Progress Notes (Signed)
I agree with the above plan 

## 2023-02-27 ENCOUNTER — Other Ambulatory Visit (INDEPENDENT_AMBULATORY_CARE_PROVIDER_SITE_OTHER): Payer: Self-pay

## 2023-02-27 DIAGNOSIS — R4189 Other symptoms and signs involving cognitive functions and awareness: Secondary | ICD-10-CM

## 2023-02-27 DIAGNOSIS — R413 Other amnesia: Secondary | ICD-10-CM

## 2023-02-27 DIAGNOSIS — E539 Vitamin B deficiency, unspecified: Secondary | ICD-10-CM

## 2023-02-27 DIAGNOSIS — Z0289 Encounter for other administrative examinations: Secondary | ICD-10-CM

## 2023-02-28 ENCOUNTER — Other Ambulatory Visit: Payer: Self-pay | Admitting: Nurse Practitioner

## 2023-02-28 LAB — VITAMIN B12: Vitamin B-12: 993 pg/mL (ref 232–1245)

## 2023-02-28 LAB — TSH: TSH: 0.74 u[IU]/mL (ref 0.450–4.500)

## 2023-03-04 ENCOUNTER — Ambulatory Visit (INDEPENDENT_AMBULATORY_CARE_PROVIDER_SITE_OTHER): Payer: 59

## 2023-03-04 DIAGNOSIS — R4189 Other symptoms and signs involving cognitive functions and awareness: Secondary | ICD-10-CM | POA: Diagnosis not present

## 2023-03-04 DIAGNOSIS — R413 Other amnesia: Secondary | ICD-10-CM

## 2023-03-04 MED ORDER — GADOBENATE DIMEGLUMINE 529 MG/ML IV SOLN
15.0000 mL | Freq: Once | INTRAVENOUS | Status: AC | PRN
  Administered 2023-03-04: 15 mL via INTRAVENOUS

## 2023-03-06 ENCOUNTER — Encounter: Payer: Self-pay | Admitting: Adult Health

## 2023-03-24 ENCOUNTER — Ambulatory Visit: Payer: 59 | Admitting: Adult Health

## 2023-03-31 ENCOUNTER — Other Ambulatory Visit: Payer: Self-pay | Admitting: Adult Health

## 2023-04-06 MED ORDER — GABAPENTIN 600 MG PO TABS: 900.0000 mg | ORAL_TABLET | Freq: Three times a day (TID) | ORAL | 3 refills | Status: DC

## 2023-04-06 NOTE — Addendum Note (Signed)
Addended by: Ihor Austin L on: 04/06/2023 10:45 AM   Modules accepted: Orders

## 2023-04-20 ENCOUNTER — Other Ambulatory Visit: Payer: Self-pay | Admitting: Anesthesiology

## 2023-04-20 MED ORDER — AMITRIPTYLINE HCL 100 MG PO TABS: 100.0000 mg | ORAL_TABLET | Freq: Every day | ORAL | 1 refills | Status: DC

## 2023-04-29 ENCOUNTER — Encounter: Payer: Self-pay | Admitting: Internal Medicine

## 2023-07-27 ENCOUNTER — Other Ambulatory Visit: Payer: Self-pay | Admitting: Nurse Practitioner

## 2023-08-11 ENCOUNTER — Other Ambulatory Visit: Payer: Self-pay

## 2023-08-11 DIAGNOSIS — E782 Mixed hyperlipidemia: Secondary | ICD-10-CM

## 2023-08-11 MED ORDER — ROSUVASTATIN CALCIUM 20 MG PO TABS: ORAL_TABLET | ORAL | 0 refills | Status: DC

## 2023-09-07 ENCOUNTER — Encounter: Payer: Self-pay | Admitting: Adult Health

## 2023-09-07 ENCOUNTER — Ambulatory Visit: Payer: 59 | Admitting: Adult Health
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
## 2023-09-08 NOTE — Telephone Encounter (Signed)
 Can no show fee be waived? Patient has not had cancellations within 24 hrs/no shows previously. Cancelled due to feeling unwell. Please let me know and I will update patient. Thank you!

## 2023-09-08 NOTE — Telephone Encounter (Signed)
 FYI

## 2023-09-18 ENCOUNTER — Encounter: Payer: 59 | Admitting: Nurse Practitioner

## 2023-09-30 ENCOUNTER — Encounter: Payer: 59 | Admitting: Nurse Practitioner

## 2023-10-08 ENCOUNTER — Other Ambulatory Visit: Payer: Self-pay | Admitting: Internal Medicine

## 2023-10-08 DIAGNOSIS — I10 Essential (primary) hypertension: Secondary | ICD-10-CM

## 2023-10-08 DIAGNOSIS — M1A9XX0 Chronic gout, unspecified, without tophus (tophi): Secondary | ICD-10-CM

## 2023-10-08 NOTE — Telephone Encounter (Signed)
 Copied from CRM [id number]. Topic: Clinical - Medication Refill >> Oct 08, 2023 12:36 PM Marlow Baars wrote: Most Recent Primary Care Visit:   Medication: allopurinol (ZYLOPRIM) 300 MG tablet, furosemide (LASIX) 40 MG tablet  Has the patient contacted their pharmacy? Yes   Is this the correct pharmacy for this prescription? Yes If no, delete pharmacy and type the correct one.  This is the patient's preferred pharmacy:  [id number] - [place], [place] - [address] AT [facility] [address] [place] [address] Phone: [phone] Fax: [phone]     Has the prescription been filled recently? No  Is the patient out of the medication? Yes the allopurinol  Has the patient been seen for an appointment in the last year OR does the patient have an upcoming appointment? Yes  Can we respond through MyChart? Yes  Please assist patient further

## 2023-10-13 ENCOUNTER — Ambulatory Visit: Admitting: Urgent Care

## 2023-10-20 ENCOUNTER — Encounter: Payer: Self-pay | Admitting: Adult Health

## 2023-10-21 MED ORDER — AMITRIPTYLINE HCL 25 MG PO TABS: ORAL_TABLET | ORAL | 0 refills | Status: DC

## 2023-10-22 ENCOUNTER — Other Ambulatory Visit: Payer: Self-pay | Admitting: Family

## 2023-10-22 DIAGNOSIS — E782 Mixed hyperlipidemia: Secondary | ICD-10-CM

## 2023-10-23 ENCOUNTER — Encounter: Payer: Self-pay | Admitting: Urgent Care

## 2023-10-23 ENCOUNTER — Ambulatory Visit: Admitting: Urgent Care

## 2023-10-23 VITALS — BP 115/77 | HR 79 | Ht 61.0 in | Wt 153.8 lb

## 2023-10-23 DIAGNOSIS — E785 Hyperlipidemia, unspecified: Secondary | ICD-10-CM

## 2023-10-23 DIAGNOSIS — D649 Anemia, unspecified: Secondary | ICD-10-CM

## 2023-10-23 DIAGNOSIS — E559 Vitamin D deficiency, unspecified: Secondary | ICD-10-CM | POA: Diagnosis not present

## 2023-10-23 DIAGNOSIS — E782 Mixed hyperlipidemia: Secondary | ICD-10-CM

## 2023-10-23 DIAGNOSIS — R945 Abnormal results of liver function studies: Secondary | ICD-10-CM

## 2023-10-23 DIAGNOSIS — I69351 Hemiplegia and hemiparesis following cerebral infarction affecting right dominant side: Secondary | ICD-10-CM

## 2023-10-23 DIAGNOSIS — M1A9XX0 Chronic gout, unspecified, without tophus (tophi): Secondary | ICD-10-CM | POA: Diagnosis not present

## 2023-10-23 DIAGNOSIS — Z8673 Personal history of transient ischemic attack (TIA), and cerebral infarction without residual deficits: Secondary | ICD-10-CM

## 2023-10-23 DIAGNOSIS — I10 Essential (primary) hypertension: Secondary | ICD-10-CM | POA: Diagnosis not present

## 2023-10-23 DIAGNOSIS — G629 Polyneuropathy, unspecified: Secondary | ICD-10-CM

## 2023-10-23 LAB — LIPID PANEL
Cholesterol: 155 mg/dL (ref 0–200)
HDL: 75.4 mg/dL (ref >39.00)
LDL Cholesterol: 57 mg/dL (ref 0–99)
NonHDL: 79.29
Total CHOL/HDL Ratio: 2
Triglycerides: 111 mg/dL (ref 0.0–149.0)
VLDL: 22.2 mg/dL (ref 0.0–40.0)

## 2023-10-23 LAB — CBC WITH DIFFERENTIAL/PLATELET
Basophils Absolute: 0.1 10*3/uL (ref 0.0–0.1)
Basophils Relative: 2 % (ref 0.0–3.0)
Eosinophils Absolute: 0.3 10*3/uL (ref 0.0–0.7)
Eosinophils Relative: 4.3 % (ref 0.0–5.0)
HCT: 39.7 % (ref 36.0–46.0)
Hemoglobin: 12.9 g/dL (ref 12.0–15.0)
Lymphocytes Relative: 26.4 % (ref 12.0–46.0)
Lymphs Abs: 1.8 10*3/uL (ref 0.7–4.0)
MCHC: 32.6 g/dL (ref 30.0–36.0)
MCV: 105.2 fl — ABNORMAL HIGH (ref 78.0–100.0)
Monocytes Absolute: 0.6 10*3/uL (ref 0.1–1.0)
Monocytes Relative: 8.7 % (ref 3.0–12.0)
Neutro Abs: 3.9 10*3/uL (ref 1.4–7.7)
Neutrophils Relative %: 58.6 % (ref 43.0–77.0)
Platelets: 231 10*3/uL (ref 150.0–400.0)
RBC: 3.77 Mil/uL — ABNORMAL LOW (ref 3.87–5.11)
RDW: 15 % (ref 11.5–15.5)
WBC: 6.7 10*3/uL (ref 4.0–10.5)

## 2023-10-23 LAB — COMPREHENSIVE METABOLIC PANEL WITH GFR
ALT: 30 U/L (ref 0–35)
AST: 33 U/L (ref 0–37)
Albumin: 4.3 g/dL (ref 3.5–5.2)
Alkaline Phosphatase: 93 U/L (ref 39–117)
BUN: 11 mg/dL (ref 6–23)
CO2: 28 meq/L (ref 19–32)
Calcium: 9.6 mg/dL (ref 8.4–10.5)
Chloride: 106 meq/L (ref 96–112)
Creatinine, Ser: 0.76 mg/dL (ref 0.40–1.20)
GFR: 83.03 mL/min (ref >60.00)
Glucose, Bld: 79 mg/dL (ref 70–99)
Potassium: 4.2 meq/L (ref 3.5–5.1)
Sodium: 144 meq/L (ref 135–145)
Total Bilirubin: 0.3 mg/dL (ref 0.2–1.2)
Total Protein: 7.2 g/dL (ref 6.0–8.3)

## 2023-10-23 LAB — HEMOGLOBIN A1C: Hgb A1c MFr Bld: 5.8 % (ref 4.6–6.5)

## 2023-10-23 LAB — TSH: TSH: 0.56 u[IU]/mL (ref 0.35–5.50)

## 2023-10-23 LAB — B12 AND FOLATE PANEL
Folate: 6.6 ng/mL (ref >5.9)
Vitamin B-12: 1227 pg/mL — ABNORMAL HIGH (ref 211–911)

## 2023-10-23 LAB — VITAMIN D 25 HYDROXY (VIT D DEFICIENCY, FRACTURES): VITD: 87.16 ng/mL (ref 30.00–100.00)

## 2023-10-23 MED ORDER — FUROSEMIDE 40 MG PO TABS: ORAL_TABLET | ORAL | 0 refills | Status: DC

## 2023-10-23 MED ORDER — ROSUVASTATIN CALCIUM 20 MG PO TABS
ORAL_TABLET | ORAL | 1 refills | Status: AC
Start: 2023-10-23 — End: ?

## 2023-10-23 MED ORDER — AMLODIPINE BESYLATE 5 MG PO TABS
ORAL_TABLET | ORAL | 1 refills | Status: AC
Start: 2023-10-23 — End: ?

## 2023-10-23 MED ORDER — TELMISARTAN 80 MG PO TABS
ORAL_TABLET | ORAL | 1 refills | Status: AC
Start: 2023-10-23 — End: ?

## 2023-10-23 MED ORDER — ALLOPURINOL 300 MG PO TABS: ORAL_TABLET | ORAL | 1 refills | Status: DC

## 2023-10-23 NOTE — Patient Instructions (Signed)
 We have refilled your medications to [facility]. Please do a one week trial off the lasix  - monitor your blood pressure and monitor for any development of edema.  I have referred you to OT and PT to help with your right sided weakness.  Labs will be available on Mychart.  Please return in 4 months

## 2023-10-23 NOTE — Progress Notes (Signed)
 New Patient Office Visit  Subjective:  Patient ID: Valerie Hobbs, female    DOB: 11-05-1960  Age: 63 y.o. MRN: [id number]  CC:  Chief Complaint  Patient presents with   Establish Care    New pt est care. Pt will need refills on meds.    HPI Valerie Hobbs presents to establish care.  Discussed the use of AI scribe software for clinical note transcription with the patient, who gave verbal consent to proceed.  History of Present Illness   Valerie Hobbs "Valerie Hobbs" is a 63 year old female who presents for medication refills and management of chronic conditions.  She is transitioning care from her previous primary care provider, which has closed. She has a history of a stroke in 2022 and has been under the care of a neurologist since then.  She takes allopurinol  150 mg daily for gout, which is well controlled. She takes furosemide  40 mg daily for ankle swelling, a medication she has been on since before her stroke. She supplements with potassium, taking three tablets every morning, though she is unsure of the strength.  Her current medications for hypertension include telmisartan , atenolol , and amlodipine . She also takes Crestor  for cholesterol and an omega-3 supplement. Her blood pressure is typically well-controlled.  She has a history of vitamin D  deficiency and takes vitamin D  daily. She also takes gabapentin , aspirin , and amitriptyline , which are prescribed by her neurologist for post-stroke symptoms. No issues with lightheadedness or low blood pressure from these medications.  She has a history of esophageal reflux but no longer takes medication for it, as her symptoms have resolved. She has a history of minimally elevated liver function tests over the past three years, possibly due to fatty liver, but has not been diagnosed with any significant liver disease.  She experienced a thalamic stroke in 2022, resulting in neuropathy and weakness on her right side,  affecting her ability to write and type. She reports occasional falls and difficulty with word recollection and memory. She did not receive occupational or physical therapy post-stroke.  Her social history includes consuming two glasses of Chardonnay daily, and never having smoked or used tobacco. She drives and lives in [place].       Outpatient Encounter Medications as of 10/23/2023  Medication Sig   amitriptyline  (ELAVIL ) 25 MG tablet Take 3 tablets (75 mg total) by mouth at bedtime for 14 days, THEN 2 tablets (50 mg total) at bedtime for 14 days, THEN 1 tablet (25 mg total) at bedtime for 14 days.   Ascorbic Acid  (VITAMIN C PO) Take 1 tablet by mouth daily.   aspirin  EC 81 MG EC tablet Take 1 tablet (81 mg total) by mouth daily. Swallow whole.   atenolol  (TENORMIN ) 100 MG tablet TAKE 1 TABLET BY MOUTH DAILY FOR BLOOD PRESSURE   cholecalciferol  (VITAMIN D ) 1000 units tablet Take 1,000 Units by mouth daily.   gabapentin  (NEURONTIN ) 600 MG tablet Take 1.5 tablets (900 mg total) by mouth 3 (three) times daily.   MAGNESIUM  PO Take 2,000 mg by mouth daily.   milk thistle 175 MG tablet Take 175 mg by mouth daily.   montelukast  (SINGULAIR ) 10 MG tablet TAKE 1 TABLET BY MOUTH DAILY FOR ALLERGIES   Omega-3 Fatty Acids (FISH OIL) 1000 MG CAPS Take 1,000 mg by mouth daily.   [DISCONTINUED] allopurinol  (ZYLOPRIM ) 300 MG tablet TAKE 1/2 TO 1 TABLET BY  MOUTH DAILY TO PREVENT GOUT   [DISCONTINUED] amLODipine  (NORVASC ) 5 MG tablet TAKE 1 TABLET BY  MOUTH DAILY FOR BLOOD PRESSURE (GOAL LESS THAN  130/80)   [DISCONTINUED] furosemide  (LASIX ) 40 MG tablet TAKE 1 TABLET BY MOUTH DAILY FOR BP AND FLUID RETENTION/ ANKLE  SWELLING   [DISCONTINUED] rosuvastatin  (CRESTOR ) 20 MG tablet TAKE 1 TABLET BY MOUTH  DAILY FOR CHOLESTEROL   [DISCONTINUED] telmisartan  (MICARDIS ) 80 MG tablet TAKE 1 TABLET BY MOUTH DAILY FOR BLOOD PRESSURE   acyclovir  (ZOVIRAX ) 400 MG tablet TAKE 1 TABLET (400 MG TOTAL) BY MOUTH 2 (TWO)  TIMES DAILY AS NEEDED (FOR FEVER BLISTERS). (Patient not taking: Reported on 10/23/2023)   allopurinol  (ZYLOPRIM ) 300 MG tablet TAKE 1/2 TO 1 TABLET BY  MOUTH DAILY TO PREVENT GOUT   amLODipine  (NORVASC ) 5 MG tablet TAKE 1 TABLET BY MOUTH DAILY FOR BLOOD PRESSURE (GOAL LESS THAN  130/80)   furosemide  (LASIX ) 40 MG tablet TAKE 1 TABLET BY MOUTH DAILY FOR BP AND FLUID RETENTION/ ANKLE  SWELLING   rosuvastatin  (CRESTOR ) 20 MG tablet TAKE 1 TABLET BY MOUTH  DAILY FOR CHOLESTEROL   telmisartan  (MICARDIS ) 80 MG tablet TAKE 1 TABLET BY MOUTH  DAILY FOR BLOOD PRESSURE   [DISCONTINUED] ondansetron  (ZOFRAN ) 8 MG tablet Take  1/2 to 1 tablet   3 to 4 times /day ( every 6 hours)  if needed for Nausea (Patient not taking: Reported on 10/23/2023)   No facility-administered encounter medications on file as of 10/23/2023.    Past Medical History:  Diagnosis Date   Allergy    Anemia    Anxiety    Asthma    Hypertension    Thalamic stroke (HCC) 07/13/2020    Past Surgical History:  Procedure Laterality Date   NO PAST SURGERIES      Family History  Problem Relation Age of Onset   Hypertension Mother    Diabetes Mother    Thyroid  disease Mother    Cataracts Mother    Cirrhosis Mother        fatty liver   Diabetes Father    Hypertension Father    Heart disease Father        bypass   Dementia Father        LEWY BODY   Dementia Maternal Grandmother    Heart disease Maternal Grandmother    Cancer Paternal Grandmother     Social History   Socioeconomic History   Marital status: Married    Spouse name: Not on file   Number of children: Not on file   Years of education: Not on file   Highest education level: Not on file  Occupational History   Not on file  Tobacco Use   Smoking status: Never   Smokeless tobacco: Never  Vaping Use   Vaping status: Never Used  Substance and Sexual Activity   Alcohol use: Yes    Alcohol/week: 14.0 standard drinks of alcohol    Types: 14 Glasses of wine  per week    Comment: daily   Drug use: Not Currently    Types: Marijuana    Comment: Quit 06/2020   Sexual activity: Yes    Partners: Male    Birth control/protection: Post-menopausal  Other Topics Concern   Not on file  Social History Narrative   Not on file   Social Drivers of Health   Financial Resource Strain: Not on file  Food Insecurity: Not on file  Transportation Needs: Not on file  Physical Activity: Not on file  Stress: Not on file  Social Connections: Not on file  Intimate Partner Violence: Not  on file    ROS: as noted in HPI  Objective:  BP 115/77   Pulse 79   Ht 5\' 1"  (1.549 m)   Wt 153 lb 12.8 oz (69.8 kg)   LMP 08/14/2018   SpO2 99%   BMI 29.06 kg/m   Physical Exam Vitals and nursing note reviewed.  Constitutional:      General: She is not in acute distress.    Appearance: Normal appearance. She is not ill-appearing, toxic-appearing or diaphoretic.  HENT:     Head: Normocephalic and atraumatic.     Right Ear: Tympanic membrane, ear canal and external ear normal. There is no impacted cerumen.     Left Ear: Tympanic membrane, ear canal and external ear normal. There is no impacted cerumen.     Nose: Nose normal.     Mouth/Throat:     Mouth: Mucous membranes are moist.     Pharynx: Oropharynx is clear. No oropharyngeal exudate or posterior oropharyngeal erythema.  Eyes:     General: No scleral icterus.       Right eye: No discharge.        Left eye: No discharge.     Extraocular Movements: Extraocular movements intact.     Pupils: Pupils are equal, round, and reactive to light.  Neck:     Thyroid : No thyroid  mass, thyromegaly or thyroid  tenderness.  Cardiovascular:     Rate and Rhythm: Normal rate and regular rhythm.     Pulses: Normal pulses.     Heart sounds: No murmur heard. Pulmonary:     Effort: Pulmonary effort is normal. No respiratory distress.     Breath sounds: Normal breath sounds. No stridor. No wheezing or rhonchi.  Abdominal:      General: Abdomen is flat. Bowel sounds are normal. There is no distension.     Palpations: Abdomen is soft. There is no mass.     Tenderness: There is no abdominal tenderness. There is no guarding.  Musculoskeletal:     Cervical back: Normal range of motion and neck supple. No rigidity or tenderness.     Right lower leg: No edema.     Left lower leg: No edema.  Lymphadenopathy:     Cervical: No cervical adenopathy.  Skin:    General: Skin is warm and dry.     Coloration: Skin is not jaundiced.     Findings: No bruising, erythema or rash.  Neurological:     General: No focal deficit present.     Mental Status: She is alert and oriented to person, place, and time.     Sensory: No sensory deficit.     Motor: Weakness (RU/RLE) present.  Psychiatric:        Mood and Affect: Mood normal.        Behavior: Behavior normal.       Assessment & Plan:  Primary hypertension -     CBC with Differential/Platelet -     TSH  Hyperlipidemia, unspecified hyperlipidemia type -     Hemoglobin A1c -     Lipid panel  Chronic gout without tophus, unspecified cause, unspecified site -     Allopurinol ; TAKE 1/2 TO 1 TABLET BY  MOUTH DAILY TO PREVENT GOUT  Dispense: 90 tablet; Refill: 1  Vitamin D  deficiency -     Comprehensive metabolic panel with GFR -     VITAMIN D  25 Hydroxy (Vit-D Deficiency, Fractures)  Anemia, unspecified type -     CBC with Differential/Platelet -  Iron, TIBC and Ferritin Panel -     B12 and Folate Panel  History of ischemic vertebrobasilar artery thalamic stroke -     Ambulatory referral to Occupational Therapy -     Ambulatory referral to Physical Therapy  Abnormal liver function  Essential hypertension -     Furosemide ; TAKE 1 TABLET BY MOUTH DAILY FOR BP AND FLUID RETENTION/ ANKLE  SWELLING  Dispense: 90 tablet; Refill: 0 -     Telmisartan ; TAKE 1 TABLET BY MOUTH  DAILY FOR BLOOD PRESSURE  Dispense: 90 tablet; Refill: 1 -     amLODIPine  Besylate; TAKE 1  TABLET BY MOUTH DAILY FOR BLOOD PRESSURE (GOAL LESS THAN  130/80)  Dispense: 90 tablet; Refill: 1 -     Hemoglobin A1c  Hyperlipidemia, mixed -     Rosuvastatin  Calcium ; TAKE 1 TABLET BY MOUTH  DAILY FOR CHOLESTEROL  Dispense: 90 tablet; Refill: 1  Hemiplegia of right dominant side as late effect of cerebral infarction, unspecified hemiplegia type [facility]) -     Ambulatory referral to Occupational Therapy -     Ambulatory referral to Physical Therapy  Neuropathy -     Ambulatory referral to Occupational Therapy -     Ambulatory referral to Physical Therapy  Assessment and Plan    Stroke with residual deficits She experienced a stroke in 2022 with right-sided weakness, neuropathy, and memory issues. Gabapentin  is used for neuropathic pain. Occupational and physical therapy benefits were discussed. - Refer to occupational and physical therapy for rehabilitation of right-sided weakness and neuropathy.  Essential hypertension Blood pressure is well-controlled with telmisartan , atenolol , and amlodipine . Lasix  is used for edema management. Discussed discontinuing Lasix  to evaluate its impact. - Continue telmisartan , atenolol , and amlodipine . - Monitor blood pressure if Lasix  is discontinued for a week.  Edema Edema managed with furosemide  40 mg daily. Discussed potential impact of discontinuing Lasix  on edema and blood pressure. - Refill furosemide  prescription. - Consider discontinuing Lasix  for a week to assess impact on edema and blood pressure, both of which are tightly controlled at present time.  Gout Gout is well-controlled with allopurinol  150 mg daily. (Pt cuts the 300mg  in half)  - Refill allopurinol  prescription.  Hyperlipidemia She is on Crestor  and omega-3 supplements for cholesterol management. - Refill Crestor  prescription.  Vitamin D  deficiency She is on daily vitamin D  supplementation. - Recheck vitamin D  levels with lab work.        Return in about 4 months  (around 02/22/2024).   Mandy Second, PA

## 2023-10-24 LAB — IRON,TIBC AND FERRITIN PANEL
%SAT: 28 % (ref 16–45)
Ferritin: 63 ng/mL (ref 16–288)
Iron: 89 ug/dL (ref 45–160)
TIBC: 323 ug/dL (ref 250–450)

## 2023-10-28 ENCOUNTER — Encounter: Payer: 59 | Admitting: Nurse Practitioner

## 2023-11-04 ENCOUNTER — Encounter: Payer: 59 | Admitting: Nurse Practitioner

## 2023-11-16 ENCOUNTER — Other Ambulatory Visit: Payer: Self-pay | Admitting: Adult Health

## 2023-11-16 ENCOUNTER — Encounter: Payer: 59 | Admitting: Nurse Practitioner

## 2023-12-14 ENCOUNTER — Other Ambulatory Visit: Payer: Self-pay | Admitting: Urgent Care

## 2023-12-14 DIAGNOSIS — B001 Herpesviral vesicular dermatitis: Secondary | ICD-10-CM

## 2023-12-14 NOTE — Telephone Encounter (Signed)
 Copied from CRM [id number]. Topic: Clinical - Medication Refill >> Dec 14, 2023 10:16 AM Adrianna P wrote: Medication: acyclovir  (ZOVIRAX ) 400 MG tablet  Has the patient contacted their pharmacy? No (Agent: If no, request that the patient contact the pharmacy for the refill. If patient does not wish to contact the pharmacy document the reason why and proceed with request.) (Agent: If yes, when and what did the pharmacy advise?)  This is the patient's preferred pharmacy:  [id number] - [place], [place] - [address] AT [facility] [address] [place] [address] Phone: [phone] Fax: [phone]  [facility] - [place], [place] - [address] [facility] [address] [place] [address] [place] [address] Phone: [phone] Fax: [phone]  [facility] - [place], [place] - [address] [place] [address] Phone: [phone] Fax: [phone]  Is this the correct pharmacy for this prescription? Yes If no, delete pharmacy and type the correct one.   Has the prescription been filled recently? No  Is the patient out of the medication? Yes  Has the patient been seen for an appointment in the last year OR does the patient have an upcoming appointment? Yes  Can we respond through MyChart? Yes  Agent: Please be advised that Rx refills may take up to 3 business days. We ask that you follow-up with your pharmacy.

## 2023-12-22 ENCOUNTER — Other Ambulatory Visit: Payer: Self-pay

## 2023-12-22 DIAGNOSIS — B001 Herpesviral vesicular dermatitis: Secondary | ICD-10-CM

## 2023-12-24 MED ORDER — ACYCLOVIR 400 MG PO TABS: 400.0000 mg | ORAL_TABLET | Freq: Two times a day (BID) | ORAL | 1 refills | Status: AC | PRN

## 2023-12-24 NOTE — Progress Notes (Signed)
 Fax came through requesting refill of acyclovir  through [facility].

## 2024-02-08 ENCOUNTER — Encounter: Payer: Self-pay | Admitting: Urgent Care

## 2024-02-08 ENCOUNTER — Other Ambulatory Visit: Payer: Self-pay

## 2024-02-08 DIAGNOSIS — I10 Essential (primary) hypertension: Secondary | ICD-10-CM

## 2024-02-08 MED ORDER — ATENOLOL 100 MG PO TABS
ORAL_TABLET | ORAL | 0 refills | Status: AC
Start: 2024-02-08 — End: ?

## 2024-02-22 ENCOUNTER — Ambulatory Visit: Admitting: Urgent Care

## 2024-03-15 ENCOUNTER — Encounter: Payer: Self-pay | Admitting: Adult Health

## 2024-03-15 ENCOUNTER — Ambulatory Visit (INDEPENDENT_AMBULATORY_CARE_PROVIDER_SITE_OTHER): Admitting: Adult Health

## 2024-03-15 VITALS — BP 134/77 | HR 65 | Ht 61.0 in | Wt 148.0 lb

## 2024-03-15 DIAGNOSIS — I671 Cerebral aneurysm, nonruptured: Secondary | ICD-10-CM | POA: Diagnosis not present

## 2024-03-15 DIAGNOSIS — G890 Central pain syndrome: Secondary | ICD-10-CM | POA: Diagnosis not present

## 2024-03-15 DIAGNOSIS — I6381 Other cerebral infarction due to occlusion or stenosis of small artery: Secondary | ICD-10-CM | POA: Diagnosis not present

## 2024-03-15 MED ORDER — GABAPENTIN 600 MG PO TABS: 1200.0000 mg | ORAL_TABLET | Freq: Three times a day (TID) | ORAL | Status: DC

## 2024-03-15 NOTE — Patient Instructions (Addendum)
 Your Plan:  Increase gabapentin  gradually to 1200mg  three times daily - start by increasing bedtime dose to 2 tablets for several days and then gradually increase morning and afternoon dosage as tolerated  If symptoms persist after 2-3 weeks, please let me know and we can consider adding additional medication   Continue on aspirin  and Crestor  and continue routine follow up with your PCP for stroke risk factor management      Follow up in 3 months or call earlier if needed      Thank you for coming to see us  at [facility]. I hope we have been able to provide you high quality care today.  You may receive a patient satisfaction survey over the next few weeks. We would appreciate your feedback and comments so that we may continue to improve ourselves and the health of our patients.
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
## 2024-03-28 ENCOUNTER — Other Ambulatory Visit: Payer: Self-pay | Admitting: Urgent Care

## 2024-03-28 DIAGNOSIS — I10 Essential (primary) hypertension: Secondary | ICD-10-CM

## 2024-03-29 ENCOUNTER — Other Ambulatory Visit: Payer: Self-pay | Admitting: Urgent Care

## 2024-03-29 DIAGNOSIS — I10 Essential (primary) hypertension: Secondary | ICD-10-CM

## 2024-03-29 DIAGNOSIS — E782 Mixed hyperlipidemia: Secondary | ICD-10-CM

## 2024-04-18 ENCOUNTER — Encounter: Payer: Self-pay | Admitting: Adult Health

## 2024-04-20 MED ORDER — DULOXETINE HCL 30 MG PO CPEP: 30.0000 mg | ORAL_CAPSULE | Freq: Every day | ORAL | 5 refills | Status: DC

## 2024-04-26 ENCOUNTER — Other Ambulatory Visit: Payer: Self-pay

## 2024-04-26 DIAGNOSIS — Z1231 Encounter for screening mammogram for malignant neoplasm of breast: Secondary | ICD-10-CM

## 2024-05-13 ENCOUNTER — Other Ambulatory Visit: Payer: Self-pay | Admitting: Adult Health

## 2024-05-18 ENCOUNTER — Ambulatory Visit: Admission: RE | Admit: 2024-05-18 | Discharge: 2024-05-18 | Disposition: A | Source: Ambulatory Visit

## 2024-05-18 DIAGNOSIS — Z1231 Encounter for screening mammogram for malignant neoplasm of breast: Secondary | ICD-10-CM

## 2024-05-30 ENCOUNTER — Encounter: Payer: Self-pay | Admitting: Adult Health

## 2024-06-01 ENCOUNTER — Other Ambulatory Visit: Payer: Self-pay | Admitting: Adult Health

## 2024-06-02 ENCOUNTER — Telehealth: Payer: Self-pay | Admitting: Adult Health

## 2024-06-02 NOTE — Telephone Encounter (Signed)
 Pt needs Gabapentin  refilled, we had to r/s her December appt b/c harlene wil be out.

## 2024-06-03 MED ORDER — GABAPENTIN 600 MG PO TABS: 1200.0000 mg | ORAL_TABLET | Freq: Three times a day (TID) | ORAL | 0 refills | Status: AC

## 2024-06-03 NOTE — Telephone Encounter (Signed)
 Rx sent.

## 2024-06-03 NOTE — Telephone Encounter (Signed)
 Pt called stating that she will run out of her gabapentin  (NEURONTIN ) 600 MG tablet before [facility] can get it to her. She would like to know if she can get an emergency refill sent to the [facility] on [address] to cover her for a couple of days. Please advise.

## 2024-06-03 NOTE — Telephone Encounter (Signed)
 7 day supply sent until mail order is received.

## 2024-06-03 NOTE — Telephone Encounter (Signed)
 Rx was sent

## 2024-06-03 NOTE — Telephone Encounter (Signed)
 Addressed in Rx request que.

## 2024-06-03 NOTE — Addendum Note (Signed)
 Addended by: ROBBERT GOSLING D on: 06/03/2024 11:48 AM   Modules accepted: Orders

## 2024-06-16 ENCOUNTER — Ambulatory Visit: Admitting: Adult Health

## 2024-07-06 ENCOUNTER — Other Ambulatory Visit: Payer: Self-pay

## 2024-07-06 MED ORDER — DULOXETINE HCL 30 MG PO CPEP: 30.0000 mg | ORAL_CAPSULE | Freq: Every day | ORAL | 3 refills | Status: AC

## 2024-07-14 ENCOUNTER — Encounter: Payer: Self-pay | Admitting: Gastroenterology

## 2024-08-02 ENCOUNTER — Ambulatory Visit: Admitting: Adult Health

## 2025-02-15 ENCOUNTER — Ambulatory Visit: Admitting: Adult Health
